# Patient Record
Sex: Male | Born: 1940
Health system: Southern US, Community
[De-identification: ages and names within clinical notes are randomized; demographics above are authoritative.]

## PROBLEM LIST (undated history)

## (undated) DIAGNOSIS — H268 Other specified cataract: Secondary | ICD-10-CM

## (undated) DIAGNOSIS — M1990 Unspecified osteoarthritis, unspecified site: Secondary | ICD-10-CM

## (undated) DIAGNOSIS — Z860100 Personal history of colon polyps, unspecified: Secondary | ICD-10-CM

## (undated) DIAGNOSIS — E785 Hyperlipidemia, unspecified: Secondary | ICD-10-CM

## (undated) DIAGNOSIS — R972 Elevated prostate specific antigen [PSA]: Secondary | ICD-10-CM

## (undated) DIAGNOSIS — K219 Gastro-esophageal reflux disease without esophagitis: Secondary | ICD-10-CM

## (undated) DIAGNOSIS — I10 Essential (primary) hypertension: Secondary | ICD-10-CM

## (undated) DIAGNOSIS — I2510 Atherosclerotic heart disease of native coronary artery without angina pectoris: Secondary | ICD-10-CM

## (undated) DIAGNOSIS — R0602 Shortness of breath: Secondary | ICD-10-CM

## (undated) DIAGNOSIS — I219 Acute myocardial infarction, unspecified: Secondary | ICD-10-CM

## (undated) DIAGNOSIS — G4730 Sleep apnea, unspecified: Secondary | ICD-10-CM

## (undated) HISTORY — DX: Personal history of colonic polyps: Z86.010

## (undated) HISTORY — PX: JOINT REPLACEMENT: SHX530

## (undated) HISTORY — DX: Elevated prostate specific antigen (PSA): R97.20

## (undated) HISTORY — DX: Atherosclerotic heart disease of native coronary artery without angina pectoris: I25.10

## (undated) HISTORY — PX: INGUINAL HERNIA REPAIR: SHX194

## (undated) HISTORY — DX: Essential (primary) hypertension: I10

## (undated) HISTORY — DX: Unspecified osteoarthritis, unspecified site: M19.90

## (undated) HISTORY — DX: Acute myocardial infarction, unspecified: I21.9

## (undated) HISTORY — PX: COLONOSCOPY W/ POLYPECTOMY: SHX1380

## (undated) HISTORY — PX: EYE SURGERY: SHX253

## (undated) HISTORY — PX: HERNIA REPAIR: SHX51

## (undated) HISTORY — DX: Sleep apnea, unspecified: G47.30

## (undated) HISTORY — DX: Gastro-esophageal reflux disease without esophagitis: K21.9

## (undated) HISTORY — PX: CARDIAC CATHETERIZATION: SHX172

## (undated) HISTORY — DX: Personal history of colon polyps, unspecified: Z86.0100

## (undated) HISTORY — DX: Hyperlipidemia, unspecified: E78.5

## (undated) HISTORY — PX: CATARACT EXTRACTION: SUR2

## (undated) HISTORY — DX: Other specified cataract: H26.8

---

## 2000-07-20 ENCOUNTER — Other Ambulatory Visit: Admission: RE | Admit: 2000-07-20 | Discharge: 2000-07-20 | Payer: Self-pay | Admitting: Internal Medicine

## 2000-07-20 ENCOUNTER — Encounter (INDEPENDENT_AMBULATORY_CARE_PROVIDER_SITE_OTHER): Payer: Self-pay | Admitting: Specialist

## 2000-09-30 ENCOUNTER — Encounter: Admission: RE | Admit: 2000-09-30 | Discharge: 2000-09-30 | Payer: Self-pay | Admitting: General Surgery

## 2000-09-30 ENCOUNTER — Encounter: Payer: Self-pay | Admitting: General Surgery

## 2000-10-01 ENCOUNTER — Ambulatory Visit (HOSPITAL_BASED_OUTPATIENT_CLINIC_OR_DEPARTMENT_OTHER): Admission: RE | Admit: 2000-10-01 | Discharge: 2000-10-02 | Payer: Self-pay | Admitting: General Surgery

## 2003-06-04 ENCOUNTER — Emergency Department (HOSPITAL_COMMUNITY): Admission: EM | Admit: 2003-06-04 | Discharge: 2003-06-04 | Payer: Self-pay | Admitting: Emergency Medicine

## 2003-07-08 ENCOUNTER — Encounter: Payer: Self-pay | Admitting: Internal Medicine

## 2003-07-22 HISTORY — PX: TOTAL KNEE ARTHROPLASTY: SHX125

## 2004-05-01 ENCOUNTER — Encounter: Admission: RE | Admit: 2004-05-01 | Discharge: 2004-05-01 | Payer: Self-pay | Admitting: Internal Medicine

## 2004-05-29 ENCOUNTER — Ambulatory Visit: Payer: Self-pay | Admitting: Internal Medicine

## 2004-07-04 ENCOUNTER — Ambulatory Visit: Payer: Self-pay | Admitting: Internal Medicine

## 2004-07-08 ENCOUNTER — Ambulatory Visit (HOSPITAL_COMMUNITY): Admission: RE | Admit: 2004-07-08 | Discharge: 2004-07-08 | Payer: Self-pay | Admitting: Physician Assistant

## 2004-07-08 ENCOUNTER — Encounter: Payer: Self-pay | Admitting: Internal Medicine

## 2004-07-17 ENCOUNTER — Inpatient Hospital Stay (HOSPITAL_COMMUNITY): Admission: RE | Admit: 2004-07-17 | Discharge: 2004-07-25 | Payer: Self-pay | Admitting: Orthopedic Surgery

## 2004-07-17 ENCOUNTER — Ambulatory Visit: Payer: Self-pay | Admitting: Cardiovascular Disease

## 2004-07-17 ENCOUNTER — Ambulatory Visit: Payer: Self-pay | Admitting: Pulmonary Disease

## 2004-07-21 HISTORY — PX: CORONARY ARTERY BYPASS GRAFT: SHX141

## 2004-07-21 HISTORY — PX: TOTAL KNEE ARTHROPLASTY: SHX125

## 2004-08-04 ENCOUNTER — Ambulatory Visit (HOSPITAL_BASED_OUTPATIENT_CLINIC_OR_DEPARTMENT_OTHER): Admission: RE | Admit: 2004-08-04 | Discharge: 2004-08-04 | Payer: Self-pay | Admitting: Orthopedic Surgery

## 2004-08-05 ENCOUNTER — Ambulatory Visit: Payer: Self-pay | Admitting: Cardiovascular Disease

## 2004-08-06 ENCOUNTER — Ambulatory Visit: Payer: Self-pay | Admitting: Internal Medicine

## 2004-08-12 ENCOUNTER — Ambulatory Visit: Payer: Self-pay | Admitting: Internal Medicine

## 2004-08-16 ENCOUNTER — Ambulatory Visit: Payer: Self-pay | Admitting: Internal Medicine

## 2004-08-19 ENCOUNTER — Ambulatory Visit: Payer: Self-pay | Admitting: Internal Medicine

## 2004-08-22 ENCOUNTER — Ambulatory Visit: Payer: Self-pay | Admitting: Critical Care Medicine

## 2004-08-23 ENCOUNTER — Ambulatory Visit (HOSPITAL_BASED_OUTPATIENT_CLINIC_OR_DEPARTMENT_OTHER): Admission: RE | Admit: 2004-08-23 | Discharge: 2004-08-23 | Payer: Self-pay | Admitting: Critical Care Medicine

## 2004-09-04 ENCOUNTER — Ambulatory Visit: Payer: Self-pay | Admitting: Surgery

## 2004-09-04 ENCOUNTER — Inpatient Hospital Stay (HOSPITAL_COMMUNITY): Admission: RE | Admit: 2004-09-04 | Discharge: 2004-09-09 | Payer: Self-pay | Admitting: Surgery

## 2004-09-24 ENCOUNTER — Ambulatory Visit: Payer: Self-pay | Admitting: Cardiovascular Disease

## 2004-09-30 ENCOUNTER — Encounter (HOSPITAL_COMMUNITY): Admission: RE | Admit: 2004-09-30 | Discharge: 2004-11-18 | Payer: Self-pay | Admitting: Cardiovascular Disease

## 2004-10-09 ENCOUNTER — Ambulatory Visit: Payer: Self-pay | Admitting: Cardiovascular Disease

## 2004-11-05 ENCOUNTER — Ambulatory Visit: Payer: Self-pay | Admitting: Pulmonary Disease

## 2005-03-04 ENCOUNTER — Ambulatory Visit: Payer: Self-pay | Admitting: Internal Medicine

## 2005-03-10 ENCOUNTER — Ambulatory Visit: Payer: Self-pay | Admitting: Internal Medicine

## 2005-04-14 ENCOUNTER — Ambulatory Visit: Payer: Self-pay | Admitting: Cardiovascular Disease

## 2005-05-22 ENCOUNTER — Ambulatory Visit: Payer: Self-pay | Admitting: Internal Medicine

## 2005-10-06 ENCOUNTER — Ambulatory Visit: Payer: Self-pay | Admitting: Cardiovascular Disease

## 2005-10-06 ENCOUNTER — Encounter: Payer: Self-pay | Admitting: Internal Medicine

## 2005-11-10 ENCOUNTER — Ambulatory Visit: Payer: Self-pay

## 2006-02-25 ENCOUNTER — Ambulatory Visit: Payer: Self-pay | Admitting: Internal Medicine

## 2006-03-03 ENCOUNTER — Ambulatory Visit: Payer: Self-pay | Admitting: Internal Medicine

## 2006-03-11 ENCOUNTER — Ambulatory Visit: Payer: Self-pay | Admitting: Internal Medicine

## 2006-03-11 LAB — HM COLONOSCOPY

## 2006-05-26 ENCOUNTER — Ambulatory Visit: Payer: Self-pay | Admitting: Internal Medicine

## 2006-05-26 LAB — CONVERTED CEMR LAB
ALT: 18 units/L (ref 0–40)
AST: 25 units/L (ref 0–37)
Chol/HDL Ratio, serum: 3.6
Cholesterol: 146 mg/dL (ref 0–200)
Hgb A1c MFr Bld: 5.5 % (ref 4.6–6.0)
LDL Cholesterol: 89 mg/dL (ref 0–99)

## 2006-10-29 ENCOUNTER — Ambulatory Visit: Payer: Self-pay | Admitting: Cardiovascular Disease

## 2007-03-16 ENCOUNTER — Ambulatory Visit: Payer: Self-pay | Admitting: Internal Medicine

## 2007-03-16 DIAGNOSIS — H26499 Other secondary cataract, unspecified eye: Secondary | ICD-10-CM

## 2007-03-16 DIAGNOSIS — I10 Essential (primary) hypertension: Secondary | ICD-10-CM

## 2007-03-16 DIAGNOSIS — H268 Other specified cataract: Secondary | ICD-10-CM

## 2007-03-17 ENCOUNTER — Telehealth (INDEPENDENT_AMBULATORY_CARE_PROVIDER_SITE_OTHER): Payer: Self-pay | Admitting: *Deleted

## 2007-03-17 ENCOUNTER — Encounter: Payer: Self-pay | Admitting: Internal Medicine

## 2007-03-19 ENCOUNTER — Encounter: Payer: Self-pay | Admitting: Internal Medicine

## 2007-05-04 ENCOUNTER — Ambulatory Visit: Payer: Self-pay | Admitting: Internal Medicine

## 2007-06-25 ENCOUNTER — Ambulatory Visit: Payer: Self-pay | Admitting: Internal Medicine

## 2007-06-25 DIAGNOSIS — E785 Hyperlipidemia, unspecified: Secondary | ICD-10-CM | POA: Insufficient documentation

## 2007-09-03 ENCOUNTER — Encounter: Payer: Self-pay | Admitting: Critical Care Medicine

## 2007-11-26 ENCOUNTER — Ambulatory Visit: Payer: Self-pay | Admitting: Cardiovascular Disease

## 2007-12-27 ENCOUNTER — Encounter: Payer: Self-pay | Admitting: Critical Care Medicine

## 2007-12-30 ENCOUNTER — Emergency Department (HOSPITAL_COMMUNITY): Admission: EM | Admit: 2007-12-30 | Discharge: 2007-12-30 | Payer: Self-pay | Admitting: Emergency Medicine

## 2008-05-18 ENCOUNTER — Ambulatory Visit: Payer: Self-pay | Admitting: Internal Medicine

## 2008-06-14 ENCOUNTER — Ambulatory Visit: Payer: Self-pay | Admitting: Internal Medicine

## 2008-06-14 DIAGNOSIS — Z860100 Personal history of colon polyps, unspecified: Secondary | ICD-10-CM | POA: Insufficient documentation

## 2008-06-14 DIAGNOSIS — R972 Elevated prostate specific antigen [PSA]: Secondary | ICD-10-CM | POA: Insufficient documentation

## 2008-06-14 DIAGNOSIS — R739 Hyperglycemia, unspecified: Secondary | ICD-10-CM

## 2008-06-14 DIAGNOSIS — M1990 Unspecified osteoarthritis, unspecified site: Secondary | ICD-10-CM | POA: Insufficient documentation

## 2008-06-14 DIAGNOSIS — I252 Old myocardial infarction: Secondary | ICD-10-CM | POA: Insufficient documentation

## 2008-06-20 ENCOUNTER — Encounter (INDEPENDENT_AMBULATORY_CARE_PROVIDER_SITE_OTHER): Payer: Self-pay | Admitting: *Deleted

## 2008-06-29 ENCOUNTER — Telehealth: Payer: Self-pay | Admitting: Internal Medicine

## 2008-07-04 ENCOUNTER — Telehealth: Payer: Self-pay | Admitting: Internal Medicine

## 2008-10-31 ENCOUNTER — Telehealth (INDEPENDENT_AMBULATORY_CARE_PROVIDER_SITE_OTHER): Payer: Self-pay | Admitting: *Deleted

## 2008-12-19 DIAGNOSIS — I2510 Atherosclerotic heart disease of native coronary artery without angina pectoris: Secondary | ICD-10-CM

## 2008-12-19 DIAGNOSIS — M1990 Unspecified osteoarthritis, unspecified site: Secondary | ICD-10-CM | POA: Insufficient documentation

## 2008-12-19 DIAGNOSIS — M129 Arthropathy, unspecified: Secondary | ICD-10-CM

## 2008-12-19 DIAGNOSIS — K219 Gastro-esophageal reflux disease without esophagitis: Secondary | ICD-10-CM

## 2008-12-20 ENCOUNTER — Ambulatory Visit: Payer: Self-pay | Admitting: Cardiovascular Disease

## 2009-05-22 ENCOUNTER — Ambulatory Visit: Payer: Self-pay | Admitting: Internal Medicine

## 2009-07-09 ENCOUNTER — Ambulatory Visit: Payer: Self-pay | Admitting: Internal Medicine

## 2009-07-09 DIAGNOSIS — G4733 Obstructive sleep apnea (adult) (pediatric): Secondary | ICD-10-CM | POA: Insufficient documentation

## 2009-07-09 DIAGNOSIS — R9431 Abnormal electrocardiogram [ECG] [EKG]: Secondary | ICD-10-CM

## 2009-07-10 ENCOUNTER — Telehealth (INDEPENDENT_AMBULATORY_CARE_PROVIDER_SITE_OTHER): Payer: Self-pay | Admitting: *Deleted

## 2009-07-16 ENCOUNTER — Encounter (INDEPENDENT_AMBULATORY_CARE_PROVIDER_SITE_OTHER): Payer: Self-pay | Admitting: *Deleted

## 2009-07-26 ENCOUNTER — Telehealth: Payer: Self-pay | Admitting: Cardiovascular Disease

## 2009-07-26 ENCOUNTER — Telehealth (INDEPENDENT_AMBULATORY_CARE_PROVIDER_SITE_OTHER): Payer: Self-pay | Admitting: *Deleted

## 2009-08-03 ENCOUNTER — Ambulatory Visit: Payer: Self-pay | Admitting: Internal Medicine

## 2009-08-03 ENCOUNTER — Encounter (INDEPENDENT_AMBULATORY_CARE_PROVIDER_SITE_OTHER): Payer: Self-pay | Admitting: *Deleted

## 2009-08-03 LAB — CONVERTED CEMR LAB: OCCULT 2: NEGATIVE

## 2009-10-30 ENCOUNTER — Telehealth (INDEPENDENT_AMBULATORY_CARE_PROVIDER_SITE_OTHER): Payer: Self-pay | Admitting: *Deleted

## 2009-10-30 ENCOUNTER — Encounter: Payer: Self-pay | Admitting: Internal Medicine

## 2009-10-31 ENCOUNTER — Encounter (INDEPENDENT_AMBULATORY_CARE_PROVIDER_SITE_OTHER): Payer: Self-pay | Admitting: *Deleted

## 2009-12-20 ENCOUNTER — Ambulatory Visit: Payer: Self-pay | Admitting: Cardiovascular Disease

## 2009-12-20 DIAGNOSIS — I4510 Unspecified right bundle-branch block: Secondary | ICD-10-CM

## 2010-03-28 ENCOUNTER — Telehealth (INDEPENDENT_AMBULATORY_CARE_PROVIDER_SITE_OTHER): Payer: Self-pay | Admitting: *Deleted

## 2010-05-16 ENCOUNTER — Ambulatory Visit: Payer: Self-pay | Admitting: Internal Medicine

## 2010-08-12 ENCOUNTER — Telehealth (INDEPENDENT_AMBULATORY_CARE_PROVIDER_SITE_OTHER): Payer: Self-pay | Admitting: *Deleted

## 2010-08-13 ENCOUNTER — Telehealth: Payer: Self-pay | Admitting: Cardiovascular Disease

## 2010-08-14 ENCOUNTER — Ambulatory Visit
Admission: RE | Admit: 2010-08-14 | Discharge: 2010-08-14 | Payer: Self-pay | Source: Home / Self Care | Attending: Internal Medicine | Admitting: Internal Medicine

## 2010-08-14 ENCOUNTER — Other Ambulatory Visit: Payer: Self-pay | Admitting: Internal Medicine

## 2010-08-14 LAB — HEPATIC FUNCTION PANEL
ALT: 21 U/L (ref 0–53)
AST: 24 U/L (ref 0–37)
Albumin: 4.2 g/dL (ref 3.5–5.2)
Alkaline Phosphatase: 60 U/L (ref 39–117)
Total Bilirubin: 0.8 mg/dL (ref 0.3–1.2)

## 2010-08-14 LAB — LIPID PANEL
HDL: 47.4 mg/dL (ref >39.00)
Total CHOL/HDL Ratio: 4
Triglycerides: 130 mg/dL (ref 0.0–149.0)

## 2010-08-18 LAB — CONVERTED CEMR LAB
AST: 25 units/L (ref 0–37)
Albumin: 4.2 g/dL (ref 3.5–5.2)
BUN: 17 mg/dL (ref 6–23)
BUN: 18 mg/dL (ref 6–23)
Basophils Absolute: 0.1 10*3/uL (ref 0.0–0.1)
Bilirubin, Direct: 0.1 mg/dL (ref 0.0–0.3)
CO2: 30 meq/L (ref 19–32)
Chloride: 102 meq/L (ref 96–112)
Cholesterol: 150 mg/dL (ref 0–200)
Creatinine, Ser: 1 mg/dL (ref 0.4–1.5)
GFR calc non Af Amer: 70.71 mL/min (ref >60)
Glucose, Bld: 95 mg/dL (ref 70–99)
HCT: 45.9 % (ref 39.0–52.0)
HDL goal, serum: 40 mg/dL
HDL: 44.1 mg/dL (ref >39.0)
Hemoglobin: 15 g/dL (ref 13.0–17.0)
Hgb A1c MFr Bld: 5.7 % (ref 4.6–6.0)
Lymphs Abs: 1.6 10*3/uL (ref 0.7–4.0)
MCHC: 32.8 g/dL (ref 30.0–36.0)
MCV: 98.8 fL (ref 78.0–100.0)
Monocytes Absolute: 0.6 10*3/uL (ref 0.1–1.0)
Monocytes Relative: 20 % — ABNORMAL HIGH (ref 3.0–12.0)
Neutro Abs: 0.8 10*3/uL — ABNORMAL LOW (ref 1.4–7.7)
PSA: 3.29 ng/mL (ref 0.10–4.00)
PSA: 3.52 ng/mL (ref 0.10–4.00)
Potassium: 4.3 meq/L (ref 3.5–5.1)
Potassium: 4.7 meq/L (ref 3.5–5.1)
RDW: 12.7 % (ref 11.5–14.6)
Sodium: 141 meq/L (ref 135–145)
TSH: 1.67 microintl units/mL (ref 0.35–5.50)
Total Bilirubin: 0.9 mg/dL (ref 0.3–1.2)
Total CHOL/HDL Ratio: 3.4
VLDL: 17 mg/dL (ref 0–40)

## 2010-08-20 NOTE — Progress Notes (Signed)
Summary: Prior Auth APPROVED for singulair  Phone Note Refill Request Message from:  [facility] Fax #: [phone]  Refills Requested: Medication #1:  PRILOSEC 20 MG CPDR 1 by mouth once daily   Dosage confirmed as above?Dosage Confirmed   Brand Name Necessary? No   Supply Requested: 3 months Prior Auth [id number]  Next Appointment Scheduled: none Initial call taken by: Harold Barban,  October 30, 2009 8:23 AM  Follow-up for Phone Call        prior auth in process Case [id number] .Kandice Hams  October 30, 2009 9:57 AM Prior Auth APPROVED  for Omeprazole from 10/09/09 to 10/30/2010.  [facility] .Kandice Hams  October 31, 2009 8:11 AM   Follow-up by: Kandice Hams,  October 31, 2009 8:11 AM

## 2010-08-20 NOTE — Progress Notes (Signed)
Summary: NEEDS CELEBREX CALLED IN AGAIN  Phone Note Call from Patient Call back at Home Phone [phone]   Caller: Patient Summary of Call: NEEDS REFILL FOR CELEBREX---[facility] [address] [facility] RECORD OF [facility] PRESCRIPTION--PATIENT ASKED THAT WE CALL HIM AT [phone] WHEN PRESCRIPTION HAS BEEN CALLED IN Initial call taken by: Jerolyn Shin,  March 28, 2010 3:22 PM  Follow-up for Phone Call        I called in rx to the pharmacy, left message on voice mail informing patient Follow-up by: Shonna Chock CMA,  March 28, 2010 3:38 PM

## 2010-08-20 NOTE — Medication Information (Signed)
Summary: Prior Authorization & Approval for Additional Quantity Omeprazol  Prior Authorization & Approval for Additional Quantity Omeprazole/[employer]   Imported By: Lanelle Bal 11/05/2009 11:33:46  _____________________________________________________________________  External Attachment:    Type:   Image     Comment:   External Document

## 2010-08-20 NOTE — Assessment & Plan Note (Signed)
Summary: f1y/dm  Medications Added MULTIVITAMINS   TABS (MULTIPLE VITAMIN) 1 tab by mouth once daily NITROSTAT 0.4 MG SUBL (NITROGLYCERIN) 1 tablet under tongue at onset of chest pain; you may repeat every 5 minutes for up to 3 doses.      Allergies Added: NKDA  Primary Provider:  Dr. Alwyn Ren  CC:  check up.  History of Present Illness: Benjamin Adkins is seen today in followup for his coronary disease.  2005 he had a perioperative MI after left knee replacement.  He required an urgent stent to the right coronary artery and subsequent bypass surgery.  He's done well since that time.  His right knee has significant arthritis.  He takes Celebrex for this.  He is a little hesitant to have repeat surgery given his perioperative MI previously.  From our perspective he is doing well.  His risk factors are well controlled.  He is on a statin drug and aspirin.  His LDL is under 80.  He diming sticking chest pain PND or orthopnea. His daughter is doing well in [place].   He is fully retired.  He enjoys reading books by Forde Radon.  And has actually finished writing two books.  One involved the spirituality of the [place].  His last ECG in 12/10 showed a new RBBB compared to 2009.  It is the same today.  I explained the conductin system to him and don't think a RBBB needs further work up at this time since he is asymptomatic.  [facility]  Current Problems (verified): 1)  Abnormal Electrocardiogram  (ICD-794.31) 2)  Sleep Apnea, Chronic  (ICD-780.57) 3)  Hyperlipidemia  (ICD-272.4) 4)  Hypertension, Essential Nos  (ICD-401.9) 5)  Cad  (ICD-414.00) 6)  Myocardial Infarction, Hx of  (ICD-412) 7)  Colonic Polyps, Hx of  (ICD-V12.72) 8)  Osteoarthritis  (ICD-715.90) 9)  Other Abnormal Glucose  (ICD-790.29) 10)  Elevated Prostate Specific Antigen  (ICD-790.93) 11)  Cataract Nec  (ICD-366.8) 12)  After-cataract Nec  (ICD-366.52) 13)  Gerd  (ICD-530.81) 14)  Arthritis   (ICD-716.90)  Current Medications (verified): 1)  Vytorin 10-20 Mg Tabs (Ezetimibe-Simvastatin) .... Take One Tablet At Bedtime 2)  Metoprolol Succinate 25 Mg  Tb24 (Metoprolol Succinate) .Marland Kitchen.. 1 By Mouth Qd 3)  Prilosec 20 Mg Cpdr (Omeprazole) .Marland Kitchen.. 1 By Mouth Once Daily 4)  Multivitamins   Tabs (Multiple Vitamin) .Marland Kitchen.. 1 Tab By Mouth Once Daily 5)  Celebrex 100 Mg Caps (Celecoxib) .... As Needed 6)  Bayer Aspirin 325 Mg Tabs (Aspirin) .... Daily 7)  Glucosamine Chondr 500 Complex  Caps (Glucosamine-Chondroit-Vit C-Mn) .... Daily 8)  Nitrostat 0.4 Mg Subl (Nitroglycerin) .Marland Kitchen.. 1 Tablet Under Tongue At Onset of Chest Pain; You May Repeat Every 5 Minutes For Up To 3 Doses.  Allergies (verified): No Known Drug Allergies  Past History:  Past Medical History: Last updated: 07/09/2009 HYPERLIPIDEMIA (ICD-272.4) HYPERTENSION, ESSENTIAL NOS (ICD-401.9) CAD (ICD-414.00) MYOCARDIAL INFARCTION, HX OF (ICD-412) hx of 2005 in PT post TKR COLONIC POLYPS, HX OF (ICD-V12.72) OSTEOARTHRITIS (ICD-715.90) fasting hyperglycemia (ICD-790.29) ELEVATED PROSTATE SPECIFIC ANTIGEN (ICD-790.93) CATARACT NEC (ICD-366.8) ATHEROSCLEROSIS, CORONARY, ARTERY BYPASS GRFT (ICD-414.04) GERD (ICD-530.81) Sleep Apnea on CPAP  Past Surgical History: Last updated: 07/09/2009 Coronary artery bypass graft,4 vessel 2006 Left heart catheterization, left ventriculography, coronary  angiography, Export thrombectomy of the proximal circumflex and first obtuse  marginal, bare metal stenting of the first obtuse marginal. Successful bare metal stenting of the obtuse  marginal.  Bare metal was used because  the left anterior descending is not  favorable for percutaneous intervention and should be treated with  subsequent coronary artery bypass grafting.  Cataract extraction bilat Colon polypectomy 2002,neg 2008 Inguinal herniorrhaphy bilat,umbilical hernia repair Total knee replacement 2005  Family History: Last updated:  07/09/2009 Father:  Cancer of stomach Mother:  arrhythmia,HTN,breast cancer,CAD Siblings: sister breast cancer ;PGF MI @ 68  Social History: Last updated: 07/09/2009 no diet Widower Alcohol use-yes: socially Former Smoker: quit in college Regular exercise-yes: stationary bike 1X/week & yardwork  Review of Systems       Denies fever, malais, weight loss, blurry vision, decreased visual acuity, cough, sputum, SOB, hemoptysis, pleuritic pain, palpitaitons, heartburn, abdominal pain, melena, lower extremity edema, claudication, or rash.   Vital Signs:  Patient profile:   70 year old male Height:      68 inches Weight:      199 pounds BMI:     30.37 Pulse rate:   69 / minute Resp:     14 per minute BP sitting:   110 / 70  (left arm)  Vitals Entered By: Kem Parkinson (December 20, 2009 2:01 PM)  Physical Exam  General:  Affect appropriate Healthy:  appears stated age HEENT: normal Neck supple with no adenopathy JVP normal no bruits no thyromegaly Lungs clear with no wheezing and good diaphragmatic motion Heart:  S1/S2 no murmur,rub, gallop or click PMI normal Abdomen: benighn, BS positve, no tenderness, no AAA no bruit.  No HSM or HJR Distal pulses intact with no bruits No edema Neuro non-focal Skin warm and dry S/P Left TKR   Impression & Recommendations:  Problem # 1:  HYPERLIPIDEMIA (ICD-272.4) At goal with no side effects and normal ECG His updated medication list for this problem includes:    Vytorin 10-20 Mg Tabs (Ezetimibe-simvastatin) .Marland Kitchen... Take one tablet at bedtime  CHOL: 150 (07/09/2009)   LDL: 88 (07/09/2009)   HDL: 46.90 (07/09/2009)   TG: 76.0 (07/09/2009) CHOL (goal): 200 (06/14/2008)   LDL (goal): 100 (06/14/2008)   HDL (goal): 40 (06/14/2008)   TG (goal): 150 (06/14/2008)  Problem # 2:  HYPERTENSION, ESSENTIAL NOS (ICD-401.9) Under good control continue low sodium diet His updated medication list for this problem includes:    Metoprolol  Succinate 25 Mg Tb24 (Metoprolol succinate) .Marland Kitchen... 1 by mouth qd    Bayer Aspirin 325 Mg Tabs (Aspirin) .Marland Kitchen... Daily  Problem # 3:  CAD (ICD-414.00) No angina continue ASA and BB His updated medication list for this problem includes:    Metoprolol Succinate 25 Mg Tb24 (Metoprolol succinate) .Marland Kitchen... 1 by mouth qd    Bayer Aspirin 325 Mg Tabs (Aspirin) .Marland Kitchen... Daily    Nitrostat 0.4 Mg Subl (Nitroglycerin) .Marland Kitchen... 1 tablet under tongue at onset of chest pain; you may repeat every 5 minutes for up to 3 doses.  Problem # 4:  RBBB (ICD-426.4) No evidence of high grade heart block.  May be related to old ILMI.  Since he is asymptomatic will follow. His updated medication list for this problem includes:    Metoprolol Succinate 25 Mg Tb24 (Metoprolol succinate) .Marland Kitchen... 1 by mouth qd    Bayer Aspirin 325 Mg Tabs (Aspirin) .Marland Kitchen... Daily    Nitrostat 0.4 Mg Subl (Nitroglycerin) .Marland Kitchen... 1 tablet under tongue at onset of chest pain; you may repeat every 5 minutes for up to 3 doses.  Patient Instructions: 1)  Your physician recommends that you schedule a follow-up appointment in: ONE YEAR Prescriptions: NITROSTAT 0.4 MG SUBL (NITROGLYCERIN) 1 tablet  under tongue at onset of chest pain; you may repeat every 5 minutes for up to 3 doses.  #25 x 12   Entered by:   Deliah Goody, RN   Authorized by:   Colon Branch, MD, [facility]   Signed by:   Deliah Goody, RN on 12/20/2009   Method used:   [facility]ronically to        [facility] [phone]* (retail)       [address]       [place], [place]  [address]       Ph: [phone]       Fax: [phone]   RxID:   [id number]    Echocardiogram Report  Procedure date:  12/20/2009  Findings:      NSR RBBB/LAFB Old inferolateral MI No change from 12/10

## 2010-08-20 NOTE — Progress Notes (Signed)
Summary: refill  Phone Note Refill Request Message from:  Fax from Pharmacy on [address] fax [phone]  Refills Requested: Medication #1:  VYTORIN 10-20 MG TABS take one tablet at bedtime [BMN]   Dosage confirmed as above?Dosage Confirmed Initial call taken by: Barb Merino,  July 26, 2009 9:28 AM    Prescriptions: VYTORIN 10-20 MG TABS (EZETIMIBE-SIMVASTATIN) take one tablet at bedtime Brand medically necessary #30 Tablet x 11   Entered by:   Shonna Chock   Authorized by:   Marga Melnick MD   Signed by:   Shonna Chock on 07/26/2009   Method used:   Electronically to        [facility] [phone]* (retail)       [address]       [place], [place]  [address]       Ph: [phone]       Fax: [phone]   RxID:   [id number]

## 2010-08-20 NOTE — Assessment & Plan Note (Signed)
Summary: FLU SHOT//PH   Nurse Visit   Allergies: No Known Drug Allergies  Orders Added: 1)  Flu Vaccine 18yrs + MEDICARE PATIENTS [Q2039] 2)  Administration Flu vaccine - MCR [G0008] Flu Vaccine Consent Questions     Do you have a history of severe allergic reactions to this vaccine? no    Any prior history of allergic reactions to egg and/or gelatin? no    Do you have a sensitivity to the preservative Thimersol? no    Do you have a past history of Guillan-Barre Syndrome? no    Do you currently have an acute febrile illness? no    Have you ever had a severe reaction to latex? no    Vaccine information given and explained to patient? yes    Are you currently pregnant? no    Lot Number:[id number]   Exp Date:01/18/2011   Manufacturer: [employer]    Site Given  Left Deltoid IM.medflu

## 2010-08-20 NOTE — Progress Notes (Signed)
Summary: ekg from 12/20   Phone Note Call from Patient Call back at Home Phone [phone] Call back at Work Phone [phone]   Caller: Patient Reason for Call: Talk to Nurse, Lab or Test Results Details for Reason: pt had ekg on 12/20 at dr. hopper office . pt wants dr. Eden Emms to look at ekg. pls call . Initial call taken by: Lorne Skeens,  July 26, 2009 11:43 AM  Follow-up for Phone Call        dr Eden Emms reviewed EKG, RBBB is new but not sign. pt aware Deliah Goody, RN  July 26, 2009 1:07 PM

## 2010-08-20 NOTE — Letter (Signed)
Summary: Results Follow up Letter  [facility] [place]  [address] [place], [place] [address]   Phone: [phone]  Fax: [phone]    08/03/2009 MRN: [id number]  [facility] [address] [place], [place]  [address]  Dear Benjamin Adkins,  The following are the results of your recent test(s):  Test         Result    Pap Smear:        Normal _____  Not Normal _____ Comments: ______________________________________________________ Cholesterol: LDL(Bad cholesterol):         Your goal is less than:         HDL (Good cholesterol):       Your goal is more than: Comments:  ______________________________________________________ Mammogram:        Normal _____  Not Normal _____ Comments:  ___________________________________________________________________ Hemoccult:        Normal _X____  Not normal _______ Comments:    _____________________________________________________________________ Other Tests:    We routinely do not discuss normal results over the telephone.  If you desire a copy of the results, or you have any questions about this information we can discuss them at your next office visit.   Sincerely,

## 2010-08-20 NOTE — Letter (Signed)
Summary: Appointment - Reminder 2  [facility], Main Office  [address]   [place], [place] [address]   Phone: [phone]  Fax: [phone]     October 31, 2009 MRN: [id number]   [facility] [address] [place], [place]  [address]   Dear Mr. Charity,  Our records indicate that it is time to schedule a follow-up appointment with Dr. Eden Emms. It is very important that we reach you to schedule this appointment. We look forward to participating in your health care needs. Please contact us at the number listed above at your earliest convenience to schedule your appointment.  If you are unable to make an appointment at this time, give [place] a call so we can update our records.     Sincerely,   [facility]igd[facility] D[facility] Scheduling Team

## 2010-08-22 NOTE — Progress Notes (Signed)
Summary: refill request   Phone Note Refill Request Message from:  Patient on August 13, 2010 11:38 AM  Refills Requested: Medication #1:  METOPROLOL SUCCINATE 25 MG  TB24 1 by mouth qd [facility] [address] 90 day supply   Method Requested: Telephone to Pharmacy Initial call taken by: Glynda Jaeger,  August 13, 2010 11:38 AM    Prescriptions: METOPROLOL SUCCINATE 25 MG  TB24 (METOPROLOL SUCCINATE) 1 by mouth qd  #30 x 12   Entered by:   Kem Parkinson   Authorized by:   Colon Branch, MD, [facility]   Signed by:   Kem Parkinson on 08/13/2010   Method used:   Electronically to        [facility] [phone]* (retail)       [address]       [place], [place]  [address]       Ph: [phone]       Fax: [phone]   RxID:   [id number]

## 2010-08-22 NOTE — Progress Notes (Signed)
Summary: Vytorin refill---wants 90 day instead of 30  Phone Note Refill Request Message from:  Patient on August 12, 2010 5:08 PM  Refills Requested: Medication #1:  VYTORIN 10-20 MG TABS take one tablet at bedtime [BMN] [facility] [address],-----wants to have a 90 day prescriptiion for this medication instead of a 30 day prescription  Next Appointment Scheduled: none Initial call taken by: Jerolyn Shin,  August 12, 2010 5:09 PM  Follow-up for Phone Call        I spoke with patient and informed him no labs since 06/2009. Schedule appointment for tomorrow to have labs checked and placed 2 weeks worth of samples at the front desk   Follow-up by: Shonna Chock CMA,  August 13, 2010 8:17 AM

## 2010-09-04 ENCOUNTER — Encounter: Payer: Self-pay | Admitting: Internal Medicine

## 2010-09-04 ENCOUNTER — Ambulatory Visit (INDEPENDENT_AMBULATORY_CARE_PROVIDER_SITE_OTHER): Payer: Medicare Other | Admitting: Internal Medicine

## 2010-09-04 DIAGNOSIS — E785 Hyperlipidemia, unspecified: Secondary | ICD-10-CM

## 2010-09-04 DIAGNOSIS — I252 Old myocardial infarction: Secondary | ICD-10-CM

## 2010-09-11 NOTE — Assessment & Plan Note (Signed)
Summary: OV PER NOTE/CBS   Vital Signs:  Patient profile:   70 year old male Weight:      199.0 pounds BMI:     30.37 Pulse rate:   72 / minute Resp:     14 per minute BP sitting:   110 / 72  (left arm) Cuff size:   large  Vitals Entered By: Shonna Chock CMA (September 04, 2010 10:07 AM) CC: Follow-up visit: discuss labs (copy given), patient with copy of formulary   Primary Care Provider:  Dr. Alwyn Ren  CC:  Follow-up visit: discuss labs (copy given) and patient with copy of formulary.  History of Present Illness:      This is a 70 year old man who presents for Hyperlipidemia follow-up. Labs reviewed & risks discussed.The patient denies muscle aches, GI upset, abdominal pain, flushing, itching, constipation, diarrhea, and fatigue.  The patient denies the following symptoms: chest pain/pressure, exercise intolerance, dypsnea, palpitations, syncope, and pedal edema.  Compliance with medications (by patient report) has been near 100%.  Dietary compliance has been good.  The patient reports exercising 2-3 X per week.  Adjunctive measures currently used by the patient include fiber and ASA.  Vytorin 10/20 is expensive; Tier 2.  Current Medications (verified): 1)  Vytorin 10-20 Mg Tabs (Ezetimibe-Simvastatin) .... Take One Tablet At Bedtime 2)  Metoprolol Succinate 25 Mg  Tb24 (Metoprolol Succinate) .Marland Kitchen.. 1 By Mouth Qd 3)  Prilosec 20 Mg Cpdr (Omeprazole) .Marland Kitchen.. 1 By Mouth Once Daily 4)  Multivitamins   Tabs (Multiple Vitamin) .Marland Kitchen.. 1 Tab By Mouth Once Daily 5)  Celebrex 100 Mg Caps (Celecoxib) .... As Needed 6)  Bayer Aspirin 325 Mg Tabs (Aspirin) .... Daily 7)  Glucosamine Chondr 500 Complex  Caps (Glucosamine-Chondroit-Vit C-Mn) .... Daily 8)  Nitrostat 0.4 Mg Subl (Nitroglycerin) .Marland Kitchen.. 1 Tablet Under Tongue At Onset of Chest Pain; You May Repeat Every 5 Minutes For Up To 3 Doses.  Allergies (verified): No Known Drug Allergies  Past History:  Past Medical History: HYPERLIPIDEMIA  (ICD-272.4): NMR Lipoprofile 2004: LDL 154(1653/711), HDL 42, TG 80.NMR  LDL goal = < 125, but with PMH of MI LDL goal = <70..  HYPERTENSION, ESSENTIAL NOS (ICD-401.9) CAD (ICD-414.00);MYOCARDIAL INFARCTION, HX OF (ICD-412)PMH  of 2005 in PT post TKR COLONIC POLYPS, HX OF (ICD-V12.72) OSTEOARTHRITIS (ICD-715.90) fasting hyperglycemia (ICD-790.29) ELEVATED PROSTATE SPECIFIC ANTIGEN (ICD-790.93) CATARACT NEC (ICD-366.8) ATHEROSCLEROSIS, CORONARY, ARTERY BYPASS GRFT (ICD-414.04) GERD (ICD-530.81) Sleep Apnea on CPAP  Physical Exam  General:  well-nourished;alert,appropriate and cooperative throughout examination Lungs:  Normal respiratory effort, chest expands symmetrically. Lungs are clear to auscultation, no crackles or wheezes. Heart:  Normal rate and regular rhythm. S1 and S2 normal without gallop, murmur, click, rub .S4 with slight slurring Pulses:  R and L carotid,radial,dorsalis pedis and posterior tibial pulses are full and equal bilaterally Neurologic:  alert & oriented X3.   Psych:  Focused & motivated   Impression & Recommendations:  Problem # 1:  HYPERLIPIDEMIA (ICD-272.4)  The following medications were removed from the medication list:    Vytorin 10-20 Mg Tabs (Ezetimibe-simvastatin) .Marland Kitchen... Take one tablet at bedtime His updated medication list for this problem includes:    Atorvastatin Calcium 40 Mg Tabs (Atorvastatin calcium) .Marland Kitchen... 1 once daily  Orders: Prescription Created Electronically [phone])  Problem # 2:  MYOCARDIAL INFARCTION, HX OF (ICD-412)  His updated medication list for this problem includes:    Metoprolol Succinate 25 Mg Tb24 (Metoprolol succinate) .Marland Kitchen... 1 by mouth qd    Bayer Aspirin  325 Mg Tabs (Aspirin) .Marland Kitchen... Daily    Nitrostat 0.4 Mg Subl (Nitroglycerin) .Marland Kitchen... 1 tablet under tongue at onset of chest pain; you may repeat every 5 minutes for up to 3 doses.  Complete Medication List: 1)  Metoprolol Succinate 25 Mg Tb24 (Metoprolol succinate) .Marland Kitchen.. 1  by mouth qd 2)  Prilosec 20 Mg Cpdr (Omeprazole) .Marland Kitchen.. 1 by mouth once daily 3)  Multivitamins Tabs (Multiple vitamin) .Marland Kitchen.. 1 tab by mouth once daily 4)  Celebrex 100 Mg Caps (Celecoxib) .... As needed 5)  Bayer Aspirin 325 Mg Tabs (Aspirin) .... Daily 6)  Glucosamine Chondr 500 Complex Caps (Glucosamine-chondroit-vit c-mn) .... Daily 7)  Nitrostat 0.4 Mg Subl (Nitroglycerin) .Marland Kitchen.. 1 tablet under tongue at onset of chest pain; you may repeat every 5 minutes for up to 3 doses. 8)  Atorvastatin Calcium 40 Mg Tabs (Atorvastatin calcium) .Marland Kitchen.. 1 once daily  Patient Instructions: 1)  Please schedule a follow-up appointment in 3 months. 2)  Hepatic Panel prior to visit, ICD-9:995.20 3)  Lipid Panel prior to visit, ICD-9:272.4 4)  It is important that you exercise regularly at least 20 minutes 5 times a week. If you develop chest pain, have severe difficulty breathing, or feel very tired , stop exercising immediately and seek medical attention. Prescriptions: ATORVASTATIN CALCIUM 40 MG TABS (ATORVASTATIN CALCIUM) 1 once daily  #90 x 0   Entered and Authorized by:   Marga Melnick MD   Signed by:   Marga Melnick MD on 09/04/2010   Method used:   [facility] to        [facility] [phone]* (retail)       [address]       [place], [place]  [address]       Ph: [phone]       Fax: [phone]   RxID:   [id number]    Orders Added: 1)  Est. Patient Level III [64332] 2)  Prescription Created Electronically [id number]

## 2010-09-12 ENCOUNTER — Telehealth (INDEPENDENT_AMBULATORY_CARE_PROVIDER_SITE_OTHER): Payer: Self-pay | Admitting: *Deleted

## 2010-09-17 NOTE — Progress Notes (Signed)
Summary: Atorvastatin side effect  Phone Note Call from Patient Call back at Work Phone [phone]   Summary of Call: Patient left message on triage that he was given Atorvastatin and the med has been leaving him light headed. The patient would like to know if this is normal or alternate effect. Please advise. Initial call taken by: Lucious Groves CMA,  September 12, 2010 3:55 PM  Follow-up for Phone Call        This should not cause this ; monitor BP, goal = < 135/85  Follow-up by: Marga Melnick MD,  September 12, 2010 4:58 PM  Additional Follow-up for Phone Call Additional follow up Details #1::        I spoke with patient and informed him of Dr.Hopper's response and then Dr.Hopper spoke with patient. Patient stated he had his b/p checked at dentist today and it was fine.   Dr.Hopper had patient 1/2 med and indicated recheck Lipids/Hep 272.4/995.20 in 10 weeks. Patient ok'd Additional Follow-up by: Shonna Chock CMA,  September 12, 2010 5:02 PM    New/Updated Medications: ATORVASTATIN CALCIUM 40 MG TABS (ATORVASTATIN CALCIUM) 1/2 by mouth once daily

## 2010-10-29 ENCOUNTER — Other Ambulatory Visit: Payer: Self-pay | Admitting: *Deleted

## 2010-10-29 MED ORDER — METOPROLOL SUCCINATE ER 25 MG PO TB24: 25.0000 mg | ORAL_TABLET | Freq: Every day | ORAL | Status: DC

## 2010-11-13 ENCOUNTER — Other Ambulatory Visit: Payer: Medicare Other

## 2010-11-13 ENCOUNTER — Other Ambulatory Visit (INDEPENDENT_AMBULATORY_CARE_PROVIDER_SITE_OTHER): Payer: Medicare Other

## 2010-11-13 DIAGNOSIS — E785 Hyperlipidemia, unspecified: Secondary | ICD-10-CM

## 2010-11-13 LAB — HEPATIC FUNCTION PANEL
AST: 31 U/L (ref 0–37)
Albumin: 4 g/dL (ref 3.5–5.2)
Alkaline Phosphatase: 70 U/L (ref 39–117)
Total Protein: 6.6 g/dL (ref 6.0–8.3)

## 2010-11-15 ENCOUNTER — Encounter: Payer: Self-pay | Admitting: Cardiovascular Disease

## 2010-11-19 ENCOUNTER — Encounter: Payer: Self-pay | Admitting: Cardiovascular Disease

## 2010-11-19 ENCOUNTER — Ambulatory Visit (INDEPENDENT_AMBULATORY_CARE_PROVIDER_SITE_OTHER): Payer: Medicare Other | Admitting: Cardiovascular Disease

## 2010-11-19 DIAGNOSIS — I10 Essential (primary) hypertension: Secondary | ICD-10-CM

## 2010-11-19 DIAGNOSIS — E785 Hyperlipidemia, unspecified: Secondary | ICD-10-CM

## 2010-11-19 DIAGNOSIS — I4510 Unspecified right bundle-branch block: Secondary | ICD-10-CM

## 2010-11-19 DIAGNOSIS — I2510 Atherosclerotic heart disease of native coronary artery without angina pectoris: Secondary | ICD-10-CM

## 2010-11-19 NOTE — Progress Notes (Signed)
Benjamin Adkins is seen today in followup for his coronary disease.  2005 he had a perioperative MI after left knee replacement.  He required an urgent stent to the right coronary artery and subsequent bypass surgery.  He's done well since that time.  His right knee has significant arthritis.  He takes Celebrex for this.  He is a little hesitant to have repeat surgery given his perioperative MI previously.  From our perspective he is doing well.  His risk factors are well controlled.  He is on a statin drug and aspirin.  His LDL is under 80.  He  Denies any  chest pain PND or orthopnea. His daughter is doing well in [place].   He is fully retired.  He enjoys reading books by Forde Radon.  And has actually finished writing two books.  One involved the spirituality of the [facility] [employer]. He is golfing a lot   Does have some dependant edema in legs especially the right with varicose veins and previous vein stripping from CABG  His last ECG in 12/10 showed a new RBBB compared to 2009.  It is the same today.  I explained the conductin system to him and don't think a RBBB needs further work up at this time since he is asymptomatic.   ROS: Denies fever, malais, weight loss, blurry vision, decreased visual acuity, cough, sputum, SOB, hemoptysis, pleuritic pain, palpitaitons, heartburn, abdominal pain, melena, , claudication, or rash.   General: Affect appropriate Healthy:  appears stated age HEENT: normal Neck supple with no adenopathy JVP normal no bruits no thyromegaly Lungs clear with no wheezing and good diaphragmatic motion Heart:  S1/S2 no murmur,rub, gallop or click PMI normal Abdomen: benighn, BS positve, no tenderness, no AAA no bruit.  No HSM or HJR Distal pulses intact with no bruits No edema Neuro non-focal Skin warm and dry No muscular weakness   Current Outpatient Prescriptions  Medication Sig Dispense Refill  . aspirin 325 MG tablet Take 325 mg by mouth daily.        Marland Kitchen atorvastatin  (LIPITOR) 40 MG tablet Take 40 mg by mouth daily.        . celecoxib (CELEBREX) 100 MG capsule Take 100 mg by mouth 2 (two) times daily as needed.       Marland Kitchen glucosamine-chondroitin 500-400 MG tablet Take 1 tablet by mouth daily.       . metoprolol succinate (TOPROL XL) 25 MG 24 hr tablet Take 1 tablet (25 mg total) by mouth daily.  90 tablet  1  . Multiple Vitamin (MULTIVITAMIN) capsule Take 1 capsule by mouth daily.        . nitroGLYCERIN (NITROSTAT) 0.4 MG SL tablet Place 0.4 mg under the tongue every 5 (five) minutes as needed.        Marland Kitchen omeprazole (PRILOSEC) 20 MG capsule Take 20 mg by mouth daily.          Allergies  Review of patient's allergies indicates no known allergies.  Electrocardiogram:  NSR 67 RBBB LAD Old IMI  Assessment and Plan

## 2010-11-19 NOTE — Patient Instructions (Signed)
Your physician recommends that you schedule a follow-up appointment in: YEAR WITH DR NISHAN  Your physician recommends that you continue on your current medications as directed. Please refer to the Current Medication list given to you today. 

## 2010-11-19 NOTE — Assessment & Plan Note (Signed)
Stable no evidence of high grade heart block or syncope

## 2010-11-19 NOTE — Assessment & Plan Note (Signed)
Cholesterol is at goal.  Continue current dose of statin and diet Rx.  No myalgias or side effects.  F/U  LFT's in 6 months. Lab Results  Component Value Date   LDLCALC 100* 11/13/2010

## 2010-11-19 NOTE — Assessment & Plan Note (Signed)
Well controlled.  Continue current medications and low sodium Dash type diet.    

## 2010-11-19 NOTE — Assessment & Plan Note (Signed)
Stable with no angina and good activity level.  Continue medical Rx  

## 2010-11-20 ENCOUNTER — Ambulatory Visit: Payer: Medicare Other | Admitting: Internal Medicine

## 2010-11-23 ENCOUNTER — Encounter: Payer: Self-pay | Admitting: Internal Medicine

## 2010-11-25 ENCOUNTER — Ambulatory Visit (INDEPENDENT_AMBULATORY_CARE_PROVIDER_SITE_OTHER): Payer: Medicare Other | Admitting: Internal Medicine

## 2010-11-25 ENCOUNTER — Encounter: Payer: Self-pay | Admitting: Internal Medicine

## 2010-11-25 DIAGNOSIS — E785 Hyperlipidemia, unspecified: Secondary | ICD-10-CM

## 2010-11-25 DIAGNOSIS — I2510 Atherosclerotic heart disease of native coronary artery without angina pectoris: Secondary | ICD-10-CM

## 2010-11-25 NOTE — Assessment & Plan Note (Signed)
NMR Lipoprofile 2004: LDL 154( 1653/711), HDL 42, TG 80. LDL goal pre MI was < 130; post MI LDL goal = < 100, ideally < 70.

## 2010-11-25 NOTE — Progress Notes (Signed)
  Subjective:    Patient ID: Benjamin Adkins, male    DOB: [birth year], 70 y.o.   MRN: [id number]  HPI Dyslipidemia assessment: Lab results  Reviewed : LDL 100 on Atorvastatin 40 mg  .   Prior Advanced Lipid Testing: NMR 2004 .   Family history of premature CAD/ MI: PGF MI @ 56 . Nutrition: decreased portions .  Exercise: seasonal golf , yardwork . Diabetes : no .  Smoking history  : quit in college .  HTN: controlled @ home  .   Weight :  Up 10#. ROS: fatigue: no ; chest pain : no ;claudication: no; palpitations: no; abd pain/bowel changes: no ; myalgias:no;  syncope : no ; memory loss: no;skin changes: no.   The labs performed 4/25 were on atorvastatin 40 mg one half pill for over a week. He had decreased it because the 40 mg made him "spacey".    Review of Systems     Objective:   Physical Exam Heart:  Normal rate and regular rhythm. S1 and S2 normal without gallop, murmur, click, rub or other extra sounds.Lungs:Chest clear to auscultation; no wheezes, rhonchi,rales ,or rubs present.No increased work of breathing.  All pulses intact without  bruits .No ischemic skin changes.          Assessment & Plan:  #1 dyslipidemia; LDL is at minimal goal of at least 100. This is on atorvastatin 40 mg one half daily  #2 coronary disease  Plan: The atorvastatin would not be changed; he will be increasing his activities in the next few weeks. I would recommend Dr. Gildardo Griffes book "Eat, Drink, and Be Healthy". Recommend rechecking fasting lipids in 4 months.

## 2010-11-25 NOTE — Patient Instructions (Signed)
Monitor fasting lipids & glucose in 4 months

## 2010-11-26 ENCOUNTER — Telehealth: Payer: Self-pay | Admitting: Internal Medicine

## 2010-11-26 NOTE — Telephone Encounter (Signed)
Patient saw Dr Alwyn Ren on 5/7--patient instructions state : "Monitor fasting lipids and glucose in 4 months"---may I please have some diag codes for his lab appt on 2011-04-29?    thanks

## 2010-11-26 NOTE — Telephone Encounter (Signed)
Routine fasting lipids (272.4, CAD)

## 2010-11-26 NOTE — Telephone Encounter (Signed)
790.29, 272.4, 995.20, Dr.Hopper please clarify if this is a regular Lipid panel or [facility], if this is a BMP, Glucose or a1c

## 2010-11-27 NOTE — Telephone Encounter (Signed)
I printed off phone note and had Dr.Hopper clarify Glucose, BMP or A1c and he indicated just a Glocose. I placed printed phone note on Benjamin Adkins's desk with lab order: Routine fasting lipid and glucose (272.4, CAD)

## 2010-11-29 ENCOUNTER — Telehealth: Payer: Self-pay | Admitting: *Deleted

## 2010-11-29 MED ORDER — PREDNISONE 20 MG PO TABS: ORAL_TABLET | ORAL | Status: AC

## 2010-11-29 NOTE — Telephone Encounter (Signed)
Per Hop ok to have prednisone 20mg  1/2 tid #9

## 2010-11-29 NOTE — Telephone Encounter (Signed)
Pt was in 5/712 says he has now gotten into some poison oak and would like to have something called into pharmacy.  [facility]

## 2010-12-03 NOTE — Assessment & Plan Note (Signed)
[facility]                            CARDIOLOGY OFFICE NOTE   NAME:Benjamin Adkins, Benjamin Adkins                        MRN:          [id number]  DATE:11/26/2007                            DOB:          [birth year]    Benjamin Adkins returns today for follow-up.  I have seen him since he had a  perioperative MI after left knee surgery.   He subsequently was found to have severe three-vessel disease and  underwent CABG.   He had all this done back, I believe, in 2005.   The patient has been doing well.  He is active.  He is going out to  [place] for his niece's wedding soon.  He has been playing golf.  He  continues to be bothered by arthritis.  The right knee is hanging in  there and does not need to be replaced yet.  He is starting to get some  stiffness in his hands.  He has not had PND, orthopnea, palpitations,  syncope or lower extremity edema.  We talked a little bit about his use  of Celebrex.  It clearly works better for his arthritis than anything  else and he is willing to take a risk in regards to a small perceived  risk of cardiac problems.  He is well revascularized and has normal LV  function.   His Lipitor was been switched to Vytorin.  His last LDL cholesterol was  under 100.   REVIEW OF SYSTEMS:  Otherwise negative.   He is on:  1. Lopressor 25 b.i.d.  2. Prilosec 20 a day.  3. Celebrex as needed, not regularly.  4. Aspirin a day.  5. Glucosamine.  6. Vytorin 10/20.   EXAMINATION:  Remarkable for a healthy-appearing middle-aged white male  in no distress.  Weight is up a bit at 202, blood pressure 134/81, pulse  58 and regular, afebrile.  HEENT:  Unremarkable.  Carotids are normal without bruit.  No  lymphadenopathy, thyromegaly or JVP elevation.  LUNGS:  Clear, good diaphragmatic motion.  No wheezing.  S1, S2 with  normal heart sounds.  PMI normal.  ABDOMEN:  Benign, bowel sounds positive.  No AAA, no tenderness, no  hepatosplenomegaly or  hepatojugular reflux.  DISTAL PULSES:  Intact, no edema.  NEURO:  Nonfocal.  SKIN:  Warm and dry.  No muscular weakness.  He is status post left  total knee replacement.   His EKG today shows normal with small Q waves in the lateral leads.   IMPRESSION:  1. Coronary artery disease, previous coronary artery bypass grafting.      Continue aspirin and beta blocker.  Follow up in a year.  2. Hypercholesterolemia.  Continue Vytorin.  LFTs were normal.  Follow      up with Dr. Jonny Ruiz.  3. Arthritis.  Continue p.r.n. use of Celebrex.  Consider alternative      given history coronary artery disease and CABG.  Follow up      orthopedic doctors.  4. History of reflux.  Continue Prilosec, low-spice diet, avoid late-      night meals.  Overall, I think he is doing well and I will see him      back in a year.     [name]. Eden Emms, MD, [facility]  Electronically Signed    PCN/MedQ  DD: 11/26/2007  DT: 11/26/2007  Job #: [id number]

## 2010-12-06 NOTE — Procedures (Signed)
NAME:  TRAVERS, GOODLEY NO.:  [id number]   MEDICAL RECORD NO.:  [id number]          PATIENT TYPE:  OUT   LOCATION:  SLEEP CENTER                 FACILITY:  [facility]   PHYSICIAN:  Marcelyn Bruins, M.D. [facility] DATE OF BIRTH:  10/11/1940   DATE OF STUDY:  08/23/2004                              NOCTURNAL POLYSOMNOGRAM   DATE OF STUDY:  August 23, 2004   REFERRING PHYSICIAN:  Dr. Shan Levans   INDICATION FOR THE STUDY:  Hypersomnia with sleep apnea. The patient has  severe obstructive sleep apnea documented on prior study and returns for  pressure titration. Epworth is 8.   SLEEP ARCHITECTURE:  The patient had a total sleep time of 290 minutes with  decreased REM and the patient never achieved slow wave sleep. Sleep onset  latency was mildly prolonged and REM latency was fairly rapid.   IMPRESSION:  1.  Continuous positive airway pressure titration study reveals fairly good      control of previously diagnosed severe obstructive sleep apnea with 13      cm of continuous positive airway pressure. The patient did have a few      breakthrough events on this pressure but also had small numbers of      central apneas. I would start with this pressure however, he may      ultimately need 14-15 centimeters if he remains symptomatic. Of note,      the patient brought his continuous positive airway pressure mask from      home and did well with the titration.  2.  No clinically significant cardiac arrhythmias.  3.  Large numbers of leg jerks with mild sleep disruption. Clinical      correlation is suggested after the patient has been treated adequately      for his sleep-disordered breathing.      KC/MEDQ  D:  08/27/2004 19:05:48  T:  08/27/2004 20:15:37  Job:  [id number]

## 2010-12-06 NOTE — Consult Note (Signed)
NAMEARLANDO, LEISINGER NO.:  [id number]   MEDICAL RECORD NO.:  [id number]          PATIENT TYPE:  INP   LOCATION:  5031                         FACILITY:  [facility]   PHYSICIAN:  Charlton Haws, M.D.     DATE OF BIRTH:  05/18/1941   DATE OF CONSULTATION:  07/19/2004  DATE OF DISCHARGE:                                   CONSULTATION   Mr. Penn is a 70 year old patient of Dr. Thurston Hole. He is two days post left  total knee replacement. We were called to see him emergently for an acute  MI.   The patient was not having chest pain. He is not a diabetic. He has a  history of hypertension and a family history of coronary disease.   However, the patient had unexplained tachycardia postoperatively.   His EKG showed a subacute inferior posterior wall MI with 2.5 mm of ST  segment depression in V2 and V3.   The patient was treated at the bedside with IV heparin and IV Lopressor.   The risks and benefits of acute catheterization were discussed. I talked to  Kirstin, Dr. Eliott Nine. He is two days postoperative, and she said that  blood thinners were probably reasonable. Dr. Thurston Hole would rather deal with  a knee hematoma than a heart attack.   The patient's preoperative medications included:  1.  Prilosec 20 mg a day.  2.  Allegra 180 mg a day.  3.  Lipitor 10 a day.  4.  Toprol 50 a day.   The patient denies a history of heart disease. He denies having a recent  stress test.   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   His activities were somewhat limited recently due to his left knee pain.   Past medical history is otherwise noncontributory.   PHYSICAL EXAMINATION:  GENERAL:  He is a little tachypneic. He is  tachycardic at a rate of 110.  LUNGS:  Clear.  ___________ S1 and S2 with normal heart sounds.  ABDOMEN:  Benign.  EXTREMITIES:  Lower extremities with intact pulses, no edema. He has a  bandage and  recent left total knee replacement.   IMPRESSION:  Postoperative acute inferior posterior wall myocardial  infarction. Need for urgent catheterization. Further recommendations to be  based on the results of this. Hopefully, it will be single-vessel disease.  Again, discussing anticoagulation with Dr. Eliott Nine., I think it is safe  to at least start heparin and I suspect Integrilin will be okay. The patient  has received some dosages of Coumadin, but his INR was only 1.5, and we will  have to hold this for the time being.   We will continue to treat him aggressively with beta blockers. His  preoperative EKG showed sinus bradycardia, rate of 57, and was otherwise  normal.       PN/MEDQ  D:  07/19/2004  T:  07/19/2004  Job:  [id number]

## 2010-12-06 NOTE — Cardiovascular Report (Signed)
NAMEZEKE, AKER NO.:  [id number]   MEDICAL RECORD NO.:  [id number]          PATIENT TYPE:  INP   LOCATION:  2108                         FACILITY:  [facility]   PHYSICIAN:  Salvadore Farber, M.D. LHCDATE OF BIRTH:  February 22, 1941   DATE OF PROCEDURE:  07/19/2004  DATE OF DISCHARGE:                              CARDIAC CATHETERIZATION   PROCEDURE:  Left heart catheterization, left ventriculography, coronary  angiography, Export thrombectomy of the proximal circumflex and first obtuse  marginal, bare metal stenting of the first obtuse marginal.   INDICATION:  Mr. Girardin is a 70 year old gentleman without prior history of  cardiovascular disease.  On December 28 he underwent left total knee  replacement by Dr. Thurston Hole.  This morning he was noted to be tachycardic,  though had no chest discomfort or shortness of breath.  Electrocardiogram  was obtained demonstrating deep ST depressions anterolaterally.  Concern was  raised for pulmonary embolus versus posterior infarct.  He was brought  urgently to the cardiac catheterization laboratory for diagnostic  angiography with possible percutaneous coronary revascularization.   PROCEDURURAL TECHNIQUE:  Informed consent was obtained.  Under 1% lidocaine  local anesthesia, a 6-French sheath was placed in right common femoral  artery using the modified Seldinger technique.  Diagnostic angiography and  ventriculography were performed using JL-4, JR-4, and pigtail catheters.  These demonstrated occlusion of the first obtuse marginal with modest  collateralization from the RCA.  There was also substantial disease of the  proximal LAD.  Clearly, the culprit was the obtuse marginal.  We therefore  decided to proceed to percutaneous revascularization of this lesion.   Anticoagulation was initiated with bivalirudin.  A 6-French CLS 2.5 guide  was advanced over a wire and engaged in the ostium of the left main.  A  Prowater wire was  advanced beyond the occlusion into the distal marginal  without difficulty.  I began by predilating with a 3.0 x 15 mm Maverick at 6  atmospheres for two inflations.  This initially restored TIMI III flow to  the distal vasculature.  However, it resulted in a shifting of plaque or  thrombus and occlusion of the AV groove circumflex.  Subsequently, I  therefore placed a second Prowater wire into the AV groove circumflex.  With  this TIMI III flow was reestablished.  During this time, however, the obtuse  marginal reoccluded.  I further dilated using a 3.0 x 30 mm Maverick for one  minute in the obtuse marginal.  This restored flow.  However, again the AV  groove circumflex occluded.  I therefore treated the AV groove circumflex  ostium using a 2.25 x 20 mm Maverick at 6 atmospheres.  This restored normal  flow to this branch.  I then stented extending from the circumflex into the  obtuse marginal using a 3.5 x 30 mm Driver stent deployed at 12 atmospheres.  After stenting there was only TIMI II flow to the obtuse marginal, but TIMI  III flow into the jailed AV groove circumflex.  Intracoronary verapamil was  administered without benefit.  It appeared that there  was distal embolus.  A  balloon catheter was then advanced to the distal portion of the vessel.  With this enough flow was established to clarify that there was in fact a  large embolus in the distal portion of the obtuse marginal.  I treated this  with an inflation of a 2.25 x 20 mm Maverick at 6 atmospheres.  This  resulted in no significant change. Given his obvious propensity to  thrombosis despite the Angiomas therapy, double bolus eptifibatide was  administered.  I used an Export catheter in the vicinity of the thrombus.  With this, the embolus was completely resolved.  Final angiography  demonstrated no residual stenosis at either the original site of the lesion,  in the AV groove circumflex, or in the site of the embolus. There  was TIMI  III flow to all distal vasculature.  The patient tolerated the procedure  well and was transferred to the holding room in stable condition.   COMPLICATIONS:  None.   FINDINGS:  1.  LV:  123/84/103.  EF approximately 45% with anterolateral hypokinesis      and apical akinesis.  2.  No aortic stenosis or mitral regurgitation.  3.  Left main:  Angiographically normal.  4.  LAD:  The proximal and mid vessel is diffusely diseased up to 80%      stenosis proximally and 70% stenosis to the mid vessel.  There is then a      focal 50% stenosis of the distal portion in the mid vessel. The LAD      gives rise to a single large diagonal branch.  This diagonal has an      ostial 80% stenosis and a mid 90% stenosis.  5.  Circumflex:  The circumflex is large vessel giving rise to a single      large obtuse marginal.  The marginal was occluded and was the culprit      lesion for his acute coronary syndrome.  As described above, this was      treated with bare metal stenting with excellent result.  6.  RCA:  Large, dominant vessel.  There is a 30% stenosis of the mid      vessel.   IMPRESSION/RECOMMENDATIONS:  Successful bare metal stenting of the obtuse  marginal.  Bare metal was used because the left anterior descending is not  favorable for percutaneous intervention and should be treated with  subsequent coronary artery bypass grafting.  I recommend that should he  remain free of substantial angina, he be treated medically for a period of  four to eight weeks while he recovers from his total knee replacement.  CABG  should then be performed.  Plavix will be continued until the time of the  CABG and resumed in the postoperative period.  Aspirin should be continued  indefinitely.  Coumadin will be reinitiated with bridging low-molecular-  weight heparin for DVT prophylaxis as he is status post knee replacement.      Will  WED/MEDQ  D:  07/19/2004  T:  07/19/2004  Job:  [id number]   cc:    Titus Dubin. Alwyn Ren, M.D. [facility]   Charlton Haws, M.D.

## 2010-12-06 NOTE — Op Note (Signed)
NAMEMEADE, HOGELAND                 ACCOUNT NO.:  [id number]   MEDICAL RECORD NO.:  [id number]          PATIENT TYPE:  INP   LOCATION:  2301                         FACILITY:  [facility]   PHYSICIAN:  Evelene Croon, M.D.     DATE OF BIRTH:  09/12/1940   DATE OF PROCEDURE:  09/04/2004  DATE OF DISCHARGE:                                 OPERATIVE REPORT   PREOPERATIVE DIAGNOSIS:  Three-vessel coronary artery disease, status post  acute myocardial infarction after a total knee replacement.   POSTOPERATIVE DIAGNOSIS:  Three-vessel coronary artery disease, status post  acute myocardial infarction after a total knee replacement.   OPERATIVE PROCEDURES:  1.  Median sternotomy.  2.  Extracorporeal circulation.  3.  Coronary artery bypass graft surgery x 4 using a left internal mammary      artery graft to the left anterior descending coronary artery with a      saphenous vein graft to the diagonal branch of the left anterior      descending, a saphenous vein graft to the obtuse marginal of the left      circumflex coronary artery and a saphenous vein graft to the distal      right coronary artery.  4.  Endoscopically vein harvesting from the right leg.   ATTENDING SURGEON:  Evelene Croon, M.D.   ASSISTANT:  Toribio Harbour, N.P.   ANESTHESIA:  General endotracheal.   CLINICAL HISTORY:  This patient is a 70 year old gentleman with a history of  hypertension and hypercholesterolemia, who underwent left total knee  replacement by Elana Alm. Thurston Hole, M.D., on July 17, 2004.  Postoperatively he had unexplained tachycardia and some nausea.  An  electrocardiogram showed an inferoposterior MI with ST segment depression in  lead V2 and V3.  He ruled in for inferior MI with a peak CPK of 134 and a  troponin of 27.8.  Cardiac catheterization on July 19, 2004, showed  three-vessel coronary artery disease.  The culprit appeared to be an  occluded large obtuse marginal that was successfully  opened with angioplasty  and stenting.  The LAD had diffuse 80% proximal stenosis.  There was about  70% stenosis in the mid portion beyond the diagonal branch.  The diagonal  itself had about 90% stenosis.  The right coronary artery was diffusely  irregular with up to 30% mid vessel stenosis.  The left ventricular ejection  fraction was about 45% with anterolateral hypokinesis and apical akinesis.  There was no gradient across the aortic valve and no mitral regurgitation.  The patient had an uneventful recovery after discharge from the hospital and  was treated with Plavix and Coumadin until recently when his Coumadin was  discontinued.  It was felt that coronary artery bypass graft surgery was the  best treatment to prevent further ischemia and infarction.  I discussed the  operative procedure with the patient and his wife, including alternatives,  benefits and risks, including bleeding, blood transfusion, infection,  stroke, myocardial infarction, graft failure and death.  They understood and  agreed to proceed.   DESCRIPTION OF PROCEDURE:  The  patient was taken to the operating room and  placed on the table in the supine position.  After induction of general  endotracheal anesthesia, a Foley catheter was placed in the bladder using  sterile technique.  Then the chest, abdomen and both lower extremities were  prepped and draped in usual sterile manner.  The chest was entered through a  median sternotomy incision and the pericardium opened in the midline.  Examination of the heart showed good ventricular contractility.  The  ascending aorta had no palpable plaques in it.   Then the left internal mammary artery was harvested from the chest wall with  a pedicle graft.  This was a medium caliber vessel with excellent blood flow  through it.  At the same time, a segment of greater saphenous vein was  harvested from the right leg using endoscopic vein harvest technique.  This  vein was of  medium size and good quality.   Then the patient was heparinized.  When an adequate activated clotting time  was achieved, the descending aorta was cannulated using a 20 French aortic  cannula for arterial inflow.  Venous outflow was achieved using a two-stage  venous cannula for the right atrial appendage.  Antegrade cardioplegia and  bent cannula were inserted in the aortic root.   The patient was placed on cardiopulmonary bypass and the distal coronary was  identified.  The LAD was a large graftable vessel.  The diagonal branch was  a medium size vessel that was heavily diseased proximally.  The obtuse  marginal was a large branching vessel.  The stent was palpable in the  proximal portion.  The area beyond the stent was soft and suitable for  grafting.  The distal sub-branches had diffuse disease in them and were  smaller.  The right coronary artery was diffusely diseased.  The distal  right coronary artery just before the takeoff of the posterior descending  branch was soft enough to graft.  The posterior descending artery and  posterolateral branches themselves were small vessels.   Then the aorta was cross clamped and 500 mL of cold blood antegrade  cardioplegia was administered in the aortic root with quick arrest of the  heart.  Systemic hypothermia to 20 degrees Centigrade and topical  hypothermia with iced saline was used.  A temperature probe was placed in  the septum and insulating pad in the pericardium.   The first distal anastomosis was performed to the obtuse marginal branch.  The internal diameter was about 2.5 mm.  The conduit used was a segment of  greater saphenous vein and the anastomosis performed in an end-to-side  manner using continuous 7-0 Prolene suture.  Flow was noted through the  graft and was excellent.   The second distal anastomosis was performed to the diagonal branch.  The internal diameter was about 1.6 mm.  The conduit used was a second segment   of greater saphenous vein and the anastomosis was performed in an end-to-  side manner using continuous 7-0 Prolene suture.  Flow was measured through  the graft and was excellent.  Then a dose of cardioplegia was given down the  vein grafts and into the aortic root.   The third distal anastomosis was performed to the distal right coronary  artery.  The internal diameter was about 2 mm.  The conduit used was a  segment of greater saphenous vein and anastomosis performed in an end-to-  side manner using continuous 7-0 Prolene suture.  Flow  was measured through  the graft and was excellent.   The fourth distal anastomosis was performed to the mid portion of the left  anterior descending coronary artery.  The internal diameter of this vessel  was about 2 mm.  The conduit used was a left internal mammary graft and it  was brought through an opening in the left pericardium anterior to the  phrenic nerve.  It was anastomosed to the LAD in an end-to-side manner using  continuous 8-0 Prolene suture.  The pedicle was sutured to the pericardium  with 6-0 Prolene sutures.  The patient was rewarmed to 37 degrees Centigrade  and clamp removed from the mammary pedicle.  There was rapid warming of the  ventricular septum and return of spontaneous ventricular fibrillation.  The  cross clamp was removed with a time of 60 minutes and the patient  spontaneously converted to sinus rhythm.   A partial occlusion clamp was placed in the aortic root and the three  proximal vein graft anastomoses were formed in an end-to-side manner using  continuous 6-0 Prolene suture.  The clamp was removed, the vein grafts  deaired and the clamps removed from them.  The proximal and distal  anastomoses appeared hemostatic and lie of the grafts satisfactory.  Graft  markers were placed around the proximal anastomoses.  Two temporary right  ventricular and right atrial pacing wires were placed and brought out  through the  skin.   When the patient had rewarmed to 37 degrees Centigrade, he was weaned from  cardiopulmonary bypass.  The total bypass time was 117 minutes.  Cardiac  function appeared excellent with a cardiac output of 5 L/min.  Protamine was  given and the venous and aortic cannulas were removed without difficulty.  Hemostasis was achieved.  Three chest tubes were placed, one in the  posterior pericardium, one in the left pleural space and one in the anterior  mediastinum.  The pericardium was loosely reapproximated over the heart.  The sternum was closed with #6 stainless steel wires.  The fascia was closed  with a continuous 1-0 Vicryl suture.  The subcutaneous tissues were closed  with a continuous 2-0 Vicryl and the skin with 3-0 Vicryl subcuticular  closure. The lower extremity vein harvest site was closed in layers in  similar manner.  The sponge, needle and instrument counts were correct, according to the scrub nurse.  Dry sterile dressings were applied over the  incisions and around the chest tubes, which were hooked to Pleur-Evac  suction.  The patient remained hemodynamically stable and was transported to  the SICU in guarded, but stable condition.      BB/MEDQ  D:  09/04/2004  T:  09/04/2004  Job:  [id number]   cc:   Salvadore Farber, M.D. [facility]  [address]  [address]  [place]  [place] [address]   Cardiac Catheterization Laboratory

## 2010-12-06 NOTE — Discharge Summary (Signed)
NAME:  Benjamin Adkins, Benjamin Adkins                 ACCOUNT NO.:  [id number]   MEDICAL RECORD NO.:  [id number]          PATIENT TYPE:  INP   LOCATION:  5037                         FACILITY:  [facility]   PHYSICIAN:  Robert A. Thurston Hole, M.D. DATE OF BIRTH:  12-Dec-1940   DATE OF ADMISSION:  07/17/2004  DATE OF DISCHARGE:  07/25/2004                                 DISCHARGE SUMMARY   ADMISSION DIAGNOSES:  1.  End-stage degenerative joint disease left knee.  2.  Hypertension.  3.  Reflux.  4.  High cholesterol.   DISCHARGE DIAGNOSES:  1.  End-stage degenerative joint disease left knee.  2.  Hypertension.  3.  Reflux.  4.  High cholesterol.  5.  Acute myocardial infarction.   PROCEDURES IN HOUSE:  1.  Patient underwent a left total knee replacement by Dr. Thurston Hole on      July 17, 2004.  2.  On July 18, 2004, the patient underwent cardiac catheterization with      insertion of cardiac stent.   HOSPITAL COURSE:  On postoperative day zero, the patient did well.   On postoperative day #1, vital signs were stable.  He was afebrile.  His  hemoglobin was 11.5.  His drain was discontinued.  Physical therapy was  ordered.   On postoperative day #2, the patient did well with physical therapy.  He was  slightly agitated.  He had difficulty with sinus issues.  His pulse was 114.  EKG was ordered due to tachycardia.  EKG showed an acute MI.    cardiology was consulted for this.  He was taken down to the cath lab,  underwent cardiac catheterization with stent placement and it was  recommended that due to his multivessel disease, he would eventually need a  coronary artery bypass graft done four to eight weeks postoperatively.  The  patient has minimal pain in his left knee, status post cardiac  catheterization and he was restarted on his CPM.   On postoperative day #3, range of motion was 0 to 90 on CPM.  His hemoglobin  is 9.6.  He has a mild amount of ecchymosis about his knee.  Surgical  wounds  are well approximated.   On postoperative day #4, surgical wounds are well approximated.  His  hemoglobin is 8.6.  He is on Coumadin.   On postoperative day #5, his INR is 9.2.  His surgical wound is well  approximated.  He is progressing with physical therapy.   On postoperative day #6, he has range of motion 0 to 80 degrees.  Surgical  wound is well approximated.  He is having episodes of SVT with 30 to 45  second episodes of apnea.  He was cleared by cardiology despite these EKG  changes and apnea to be transferred to 5000.  Consult was called for  pulmonary to evaluate him for possible sleep apnea.   On postoperative day #7, T-max 100.1.  His hemoglobin was 10.4, INR was 2.5.  His surgical wound was well approximated.  He had difficulty with a heat  rash on his back.  He was  evaluated by pulmonary and felt that he probably  had obstructive sleep apnea and would need a sleep study done on an  outpatient basis and was given CPAP machine.  Patient was also consulted by  Dr. Laneta Simmers for planning for his coronary artery bypass graft that will be  done within four to six weeks.  Dopplers were done of both his legs to  evaluate his veins for this upcoming surgery.   On postoperative day #8, the patient was doing very well with regards to his  knee.  His INR was 2.1.  He had no complaints.  Surgical wound was well  approximated. He was 0 to 90 on his CPM.  He was discharged to home in  stable condition, weightbearing as tolerated on the following medications.   DISCHARGE MEDICATIONS:  1.  Percocet 7.5 one to two q.4-6h. p.r.n. pain.  2.  Robaxin 500 mg one q.4-6h. p.r.n. muscle spasm.  3.  Toprol XL 50 mg one p.o. daily.  4.  Allegra 180 mg one p.o. daily.  5.  Lipitor 10 mg one p.o. daily.  6.  Prilosec 20 mg one p.o. daily.  7.  Coumadin 5 mg one tablet every other day and half a tablet every other      day.  8.  Plavix 75 mg one p.o. daily.  9.  Aspirin 325 mg one p.o.  daily.  10. Flonase two sprays daily.   He is weightbearing as tolerated, on a regular diet.  He has been instructed  to call with temperature greater than 101, increased redness and/or  increased drainage.  He will follow up with Dr. Thurston Hole on July 30, 2004,  for staple removal.  He has a sleep study scheduled August 04, 2004, to  evaluate his sleep apnea and has coronary artery bypass graft scheduled for  September 03, 2004, with Dr. Laneta Simmers.      Kirstin Shepperson, P.A.      Robert A. Thurston Hole, M.D.  Electronically Signed    KS/MEDQ  D:  09/18/2004  T:  09/18/2004  Job:  [id number]

## 2010-12-06 NOTE — Op Note (Signed)
NAME:  Benjamin Adkins, Benjamin Adkins                 ACCOUNT NO.:  [id number]   MEDICAL RECORD NO.:  [id number]          PATIENT TYPE:  INP   LOCATION:  5031                         FACILITY:  [facility]   PHYSICIAN:  Robert A. Thurston Hole, M.D. DATE OF BIRTH:  [birth year]   DATE OF PROCEDURE:  07/17/2004  DATE OF DISCHARGE:                                 OPERATIVE REPORT   PREOPERATIVE DIAGNOSIS:  Left knee degenerative joint disease.   POSTOPERATIVE DIAGNOSIS:  Left knee degenerative joint disease.   OPERATION PERFORMED:  Left total knee replacement using [employer] Scorpio,  total knee system with #7 cemented femur, #9 cemented tibia with a 15 mm  polyethylene flexed tibial spacer and a 26 mm polyethylene cemented patella.   SURGEON:  Elana Alm. Thurston Hole, M.D.   ASSISTANT:  Julien Girt, P.A.   ANESTHESIA:  General.   OPERATIVE TIME:  One hour 30 minutes.   COMPLICATIONS:  None.   DESCRIPTION OF PROCEDURE:  Mr. Dapolito was brought to the operating room on  July 17, 2004 and placed on the operating table in supine position.  He  had received a femoral nerve block preoperatively by anesthesia for  postoperative pain control.  He received Ancef 1 g IV preoperatively for  prophylaxis.  After being placed under general anesthesia, he had a Foley  catheter placed under sterile conditions.  His left knee was then examined.  Range of motion from -5 to 125 degrees and moderate varus deformity.  Knee  stable to ligamentous exam with normal patellar tracking.  The left leg was  then prepped using sterile  DuraPrep and draped using sterile technique.  The leg was exsanguinated and a thigh tourniquet elevated to 350 mm.  Initially through a 15 cm longitudinal incision based over the patella,  initial exposure was made.  The underlying subcutaneous tissues were incised  in line with the skin incision.  A median arthrotomy was performed revealing  an excessive amount of normal-appearing joint fluid.  The  articular surfaces  were inspected.  He had grade 4 changes medially, grade 3 and 4 changes  laterally and grade 3 and 4 changes in the patellofemoral joint.  The medial  and lateral meniscal remnants were removed as well as the anterior cruciate  ligament.  Osteophytes were removed off the femoral condyles and tibial  plateau.  An intramedullary drill was then drilled up the femoral canal for  placement of the distal femoral cutting jig which was placed in the  appropriate amount of rotation and the distal 10 mm cut was made.  The  distal femur was incised.  #7 was found to be the appropriate size.  A #7  cutting jig was placed and then these cuts were made.  The proximal tibia  was then exposed.  The tibial spines were removed with an oscillating saw.  Intramedullary drill was then drilled down the tibial canal for placement of  the proximal tibial cutting jig.  This was placed in the appropriate amount  of rotation and a proximal 4 mm cut was made based off the medial or lower  side.  After this was done, the Scorpio PCL cutter was placed back on the  distal femur and then these cuts were made.  At this point the femoral trial  was placed, then a number 9 tibial base plate trial was placed and with a 15  mm polyethylene flex tibial spacer there was found to be excellent  restoration of normal alignment and excellent stability with a range of  motion from 0 to 130 degrees with no lift off on the tray.  The tibial base  plate was then marked for rotation and the keel cut was made.  After this  was done, the patella was sized.  26 mm was found to be the appropriate size  and recessed 10 mm x 26 mm cut was made and three locking holes were placed.  After this was done, it was felt that all of the trial components were of  excellent size, fit and stability.  They were then removed and the knee was  then jet lavage irrigated with 3 L of saline solution.  The proximal tibia  was exposed and a  #9 tibial baseplate with cement backing was hammered into  position with an excellent fit with excess cement being removed from around  the edges.  The #7 femoral component with cement backing was hammered into  position, also with an excellent fit with excess cement being removed from  around the edges.  The 15 mm polyethylene Flex tibial spacer was then locked  on the tibial baseplate, the knee taken through a full range of motion and  found to be stable with his varus deformity corrected.  The 26 mm  polyethylene cement backed patella was then locked into its recessed hole  and held there with a clamp.  After the cement hardened, patellofemoral  tracking was evaluated and this was found to be normal.  At this point it  was felt that all the components were of excellent size, fit and stability.  The knee was further irrigated with saline and then the arthrotomy was  closed with #1 Ethibond suture over two medium Hemovac drains.  Subcutaneous  tissues were closed with 0 and 2-0 Vicryl, skin closed with skin staples.  Sterile dressings were applied.  The Hemovac injected with 0.25% Marcaine  with epinephrine and 4 mg of morphine and clamped.  Sterile dressings and a  long leg splint applied. Tourniquet was released and the patient then  awakened, extubated and taken to recovery in stable condition.  Sponge and  needle counts were correct times two at the end of this case.       RAW/MEDQ  D:  07/17/2004  T:  07/17/2004  Job:  [id number]

## 2010-12-06 NOTE — Op Note (Signed)
[place]. [facility]  Patient:    Benjamin Adkins, Benjamin Adkins                        MRN: [id number] Proc. Date: 10/01/00 Adm. Date:  [id number] Attending:  Arlis Porta CC:         Corwin Levins, M.D. [facility]   Operative Report  PREOPERATIVE DIAGNOSIS:  Left inguinal hernia.  POSTOPERATIVE DIAGNOSES: 1. Bilateral indirect inguinal hernias. 2. Umbilical hernia.  PROCEDURES: 1. Laparoscopic bilateral inguinal hernia repair with mesh. 2. Umbilical hernia repair with mesh.  SURGEON:  Adolph Pollack, M.D.  ASSISTANT:  Gwenlyn Perking, M.D.  ANESTHESIA:  General.  INDICATIONS:  Mr. Kissoon is a 70 year old male who has noticed a bulge in his groin that has become increasingly painful and large.  I saw him in the office and then scheduled him for urgent left inguinal hernia repair.  At that time in the office, there was a suggestion of potential periumbilical fascial defect.  I discussed the procedure and risks with him.  He asked me to fix an umbilical hernia simultaneously if it was found.  DESCRIPTION OF PROCEDURE:  He was placed supine on the operating table and a general anesthetic administered.  I then examined him and was unable to demonstrate a definite umbilical hernia.  The lower abdomen and groin were shaved, sterilely prepped and draped, and a Foley catheter was placed sterilely.  A subumbilical incision was made, incising the skin sharply. Subcutaneous tissue was dissected sharply and bluntly.  A 1-1.5 cm incision was made in the left anterior rectus sheath.  The underlying rectus muscle was swept laterally and the posterior rectus sheath identified.  The balloon dissection device was then placed in the posterior rectus sheath and under direct vision, balloon dissection of part of the right side and all of the left extraperitoneal area was performed adequately.  The balloon dissector was then removed and the trocar placed, and CO2 gas was pumped  into the extraperitoneal space to create a working space.  Next, two 5 mm trocars were placed just to the right of midline.  I began by identifying the symphysis pubis and Coopers ligament.  I looked in the direct space area, and this was solid.  I subsequently dissected the spermatic cord and isolated it and noticed an indirect sac and reduced it back to the level of the anterior superior iliac spine.  I identified the lateral abdominal wall and dissected filmy adhesions away from it up to the level of the anterior superior iliac spine.  Next, a piece of 4 x 6 inch mesh was brought into the field and a longitudinal slit was cut halfway.  It was then placed in the extraperitoneal space.  There were two tails created by the slit, and these were wrapped around the spermatic cord.  The medial aspect of the mesh was then anchored to Coopers ligament and then to the anterior abdominal wall. The lateral aspect of the mesh was anchored to the anterior abdominal wall. The two tails were crossed and anchored to the anterior abdominal wall.  The anchoring was done by the spiral tacker, and I could palpate all the areas that the tacks were placed.  This allowed for adequate coverage of the direct, indirect, and femoral spaces.  During the procedure, I noticed that he had dilation of the right side of the scrotum as well.  I subsequently approached the right side and dissected out  the right extraperitoneal space.  I exposed the right spermatic cord and noticed an indirect hernia sac.  This was reduced back to the level of the anterior superior iliac spine but in doing so, a tear in the peritoneum was created.  A pneumoperitoneum had been noted earlier and decompressed with a Veress needle.  I repaired the tear in the peritoneum by stapling the peritoneum back together.  I then took another piece of 4 x 6 inch mesh, cut a longitudinal slit in it halfway, and inserted it into the extraperitoneal  space.  I wrapped the two tails of the mesh around the spermatic cord and anchored it medially to Coopers ligament and the anterior abdominal wall.  Laterally, the superior tail was anchored to the anterior abdominal wall, and the inferior one was anchored to the superior one with a single spiral tack.  Both meshes were then anchored anteriorly to the anterior abdominal wall.  On the right side as on the left, this allowed for coverage of the indirect, direct, and femoral spaces.  I then released the extraperitoneal gas and removed the trocars.  I subsequently approached the umbilical hernia.  I amputated the umbilicus and exposed the underlying umbilical hernia, which had some omentum that was easily reducible.  I initially closed this with four interrupted 0 Surgilon sutures.  I then closed the anterior rectus sheath defect with a 0 Vicryl suture.  I then placed a piece of onlay mesh over the umbilical hernia repair and anchored it to the surrounding fascia with the 0 Surgilon sutures.  The umbilicus was reimplanted to the mesh with a 3-0 Vicryl suture.  The area was irrigated.  The subcutaneous tissue was closed over the umbilical mesh with a running 3-0 Vicryl suture.  All skin incisions were then closed with 4-0 Monocryl subcuticular stitches, followed by Steri-Strips and a sterile dressing.  He tolerated the procedure well without any apparent complications and was taken to the recovery room in satisfactory condition. DD:  10/01/00 TD:  10/01/00 Job: [id number]

## 2010-12-06 NOTE — Procedures (Signed)
NAME:  Benjamin Adkins, Benjamin Adkins                 ACCOUNT NO.:  [id number]   MEDICAL RECORD NO.:  [id number]          PATIENT TYPE:  OUT   LOCATION:  SLEEP CENTER                 FACILITY:  [facility]   PHYSICIAN:  Clinton D. Maple Hudson, M.D. DATE OF BIRTH:  1941-06-13   DATE OF STUDY:  08/04/2004                              NOCTURNAL POLYSOMNOGRAM   REFERRING PHYSICIAN:  Dr. Salvatore Marvel   INDICATION FOR STUDY:  Hypersomnia with sleep apnea.  Epworth sleepiness  score 8/24.  BMI 27.3.  Weight 185 pounds.  The patient had had knee  surgery, requested a hospital bed, and took an oxycodone pill for pain at  3:42 a.m.   SLEEP ARCHITECTURE:  Total sleep time 346 minutes with sleep efficiency 80%.  Stage 1 was 14%, stage 2 63%, stages 3 and 4 were absent, REM was 23% of  total sleep time.  Sleep latency 15 minutes, REM latency 18 minutes, awake  after sleep onset 77 minutes, arousal index 52 which is increased.   RESPIRATORY DATA:  NPSG protocol ordered.  RDI 66 obstructive events per  hour, indicating severe obstructive sleep apnea/hypopnea syndrome.  This  included 20 central apneas, 225 obstructive apneas, 131 mixed apneas, and 5  hypopneas.  He slept only supine.  REM RDI 52 per hour.   OXYGEN DATA:  Snoring noted.  Oxygen desaturation to a nadir of 67% with  mean oxygen saturation throughout the study 90-93% on room air.   CARDIAC DATA:  Normal sinus rhythm.  Vagal sinus arrhythmia was noted with  slowing to as low as 34 beats per minute during apneas.   MOVEMENT/PARASOMNIA:  No significant leg jerks.   IMPRESSION/RECOMMENDATION:  Severe obstructive sleep apnea/hypopnea  syndrome, respiratory disturbance index 66 per hour with oxygen desaturation  to 67%.  Suggest early return for CPAP titration or evaluation for  alternative therapies.      CDY/MEDQ  D:  08/11/2004 11:35:37  T:  08/11/2004 15:31:37  Job:  [id number]

## 2010-12-06 NOTE — Assessment & Plan Note (Signed)
Benjamin Adkins                            CARDIOLOGY OFFICE NOTE   NAME:Benjamin Adkins                        MRN:          [id number]  DATE:10/29/2006                            DOB:          Jan 06, 1941    Benjamin Adkins returns today for followup, he is status post CABG.  Unfortunately, he had a perioperative MI after his left knee replacement  requiring fairly urgent stenting and then elective CABG for residual  disease.  He is doing well, he has low normal ejection fraction.  Dr.  Alwyn Ren has been following his cholesterol, he is on Vytorin.  He said  his last report was excellent.  He has not had any significant chest  pain.   Ten point review of systems is negative except for residual arthritis in  the left knee.  We had a bit of a discussion about this and I assured  him that his next knee surgery would be less eventful since his coronary  disease is now identified and treated.  Otherwise he is doing well.   MEDICATIONS:  1. Lopressor 25 a day.  2. Baby aspirin, an aspirin a day.  3. Anti-inflammatories for his knee pain.  4. Vytorin.   We also discussed the [place] Journal [employer] Medicine article regarding  Zetia and I think it is fine for him to be on this.  I think he was on  Lipitor and Zocor before and had not reached target for an LDL under 80.   EXAMINATION:  Blood pressure is 120/70, pulse is 77 and regular.  HEENT:  Normal.  Carotids are normal without bruit.  LUNGS:  Clear.  There is an S1, S2, with normal heart sounds.  ABDOMEN:  Has a palpable aorta.  He had an ultrasound on his last visit  with me because his aorta was palpable and there was no aneurysm.  Distal pulses are intact, no edema.  NEURO:  Nonfocal.  SKIN:  Warm and dry.   EKG showed sinus rhythm with possible __________ MI, with possible old  lateral wall MI.   IMPRESSION:  1. Coronary artery disease status post myocardial infarction with      coronary artery bypass  grafting, low normal ejection fraction,      currently asymptomatic.  Continue aspirin and beta blocker.  2. Cholesterol being followed by Dr. Alwyn Ren.  Continue Vytorin.   I will see the patient back in a year.  Again, he has significant  arthritis in his right knee and may need a replacement in the future.  Currently his cardiac status is extremely stable and he could have this  at any time.     Benjamin Adkins. Benjamin Emms, MD, [facility]  Electronically Signed    PCN/MedQ  DD: 10/29/2006  DT: 10/29/2006  Job #: [id number]

## 2010-12-06 NOTE — Consult Note (Signed)
NAME:  Benjamin Adkins, Benjamin Adkins                 ACCOUNT NO.:  [id number]   MEDICAL RECORD NO.:  [id number]          PATIENT TYPE:  INP   LOCATION:  5037                         FACILITY:  [facility]   PHYSICIAN:  Evelene Croon, M.D.     DATE OF BIRTH:  1941/02/08   DATE OF CONSULTATION:  07/24/2004  DATE OF DISCHARGE:                                   CONSULTATION   REASON FOR CONSULTATION:  Three-vessel coronary artery disease, status post  inferoposterior myocardial infarction and stenting of an occluded obtuse  marginal branch.   HISTORY OF PRESENT ILLNESS:  The patient is a 70 year old gentleman with a  history of hypercholesterolemia and hypertension who underwent a left total  knee replacement on July 17, 2004.  Postoperatively, the patient had  unexplained tachycardia as well as some nausea.  An electrocardiogram showed  an inferoposterior MI with 2.5 mm of ST segment depression in lead V2 and  V3.  He ruled in for myocardial infarction with a peak CK of 1034.  His  troponin increased to 27.84.  He underwent cardiac catheterization on  July 19, 2004 which showed three-vessel coronary artery disease.  The  culprit appeared to be an occluded large obtuse marginal branch.  This was  opened successfully with angioplasty and stenting.  The LAD had diffuse 80%  stenosis.  There was about 70% stenosis in the mid-portion beyond the  diagonal branch.  The diagonal itself had about 90% stenosis.  The right  coronary artery was diffusely irregular with up to 30% mid-vessel stenosis.  Left ventricular ejection fraction was about 45% with anterolateral  hypokinesis and apical akinesis.  There was no aortic stenosis or mitral  regurgitation.   REVIEW OF SYSTEMS:  GENERAL:  He denies any fever of chills.  He has had no  recent weight changes.  He has been fairly sedentary due to his orthopedic  problems.  EYES:  He has had right eye surgery for cataract.  ENT:  Negative.  ENDOCRINE:  He denies  diabetes and hypothyroidism.  CARDIOVASCULAR:  He denies any chest pain or pressure.  He has had some  shortness of breath with exertion like walking up stairs.  He denies PND and  orthopnea.  He has had no palpitations or peripheral edema.  RESPIRATORY:  He denies cough and sputum production.  GASTROINTESTINAL: He has had no  nausea or vomiting.  He denies melena and bright red blood per rectum.  GENITOURINARY:  He denies dysuria and hematuria.  NEUROLOGICAL:  He denies  any focal weakness or numbness.  He denies dizziness and syncope.  MUSCULOSKELETAL:  He has arthritis in both knees, worse on the left than the  right.  PSYCHIATRIC:  Negative.  ALLERGIES:  None.   MEDICATIONS PRIOR TO ADMISSION:  1.  Toprol 50 mg daily.  2.  Lipitor 10 mg daily.  3.  Allegra 180 mg daily.  4.  Prilosec 20 mg daily.   PAST MEDICAL HISTORY:  Significant for hypertension and  hypercholesterolemia, and he has been followed by Titus Dubin. Alwyn Ren, M.D.  for this.  He has  a possible history of obstructive sleep apnea which is  being worked up.  He has a history of recently-diagnosed coronary artery  disease as mentioned above.   PREVIOUS SURGERY:  Left total knee replacement as well as a surgery for  bilateral inguinal hernia and umbilical hernia repair laparoscopically.   SOCIAL HISTORY:  He is married and lives with his wife.  He is semi-retired  from [employer].  He smoked very briefly in college but has not smoked  since.  He denies alcohol abuse.   FAMILY HISTORY:  His grandfather died of heart disease, but he was elderly.   PHYSICAL EXAMINATION:  VITAL SIGNS:  His blood pressure 133/78, and his  pulse 80 and regular.  His respiratory rate is 16 and unlabored.  GENERAL:  He is a well-developed white male in no distress.  HEENT EXAM:  Normocephalic and atraumatic.  His right pupil is irregular due  to cataract extraction.  Extraocular muscles are intact.  His throat is  clear.  NECK EXAM:   Normal carotid pulses bilaterally.  There are no bruits.  There  is no adenopathy or thyromegaly.  CARDIAC EXAM:  Regular rate and rhythm with normal S1 and S2.  There is no  murmur, rub, or gallop.  LUNG EXAM:  Clear.  ABDOMINAL EXAM:  Active bowel sounds.  His abdomen was soft and nontender.  There are no palpable masses or organomegaly.  EXTREMITY EXAM:  No peripheral edema.  Pedal pulses are palpable  bilaterally.  His left leg is in a CPM machine.  NEUROLOGIC EXAM:  He is alert and oriented x3.  Motor and sensory exams are  grossly normal.  SKIN:  Warm and dry.   DATA:  Carotid Doppler examination shows no evidence of internal carotid  artery stenosis.  His palmar arches are normal with radial and ulnar  compression.   LABORATORY EXAMINATION:  Low potassium of 3.2, glucose 124, BUN 9, and  creatinine 0.9.  Hemoglobin is 9.2, white blood cell count 7.5, platelet  count 201,000.  Chest x-ray shows no evidence of active disease.  Electrocardiogram shows normal sinus rhythm with inferoposterolateral  intact.   IMPRESSION:  This patient has three-vessel coronary artery disease status  post acute inferoposterior MI secondary to occlusion of the large obtuse  marginal branch after knee replacement.  This was opened successfully with  stenting.  He has residual high-grade diffuse stenosis of his proximal LAD  and diagonal which is not amenable to percutaneous intervention. I agree  that coronary artery bypass graft surgery is the best treatment to prevent  further ischemia and infarction.  I have discussed the operative procedure  with the patient including alternatives, benefits, and risks including  bleeding, blood transfusion, infection, stroke, myocardial infarction, graft  failure, and death.  He understands and agrees to proceed.  He is scheduled  to go home tomorrow and will undergo a period of rehabilitation before proceeding with coronary bypass surgery.  He will continue on  Plavix and  aspirin up to the time of surgery, and we will plan to resume these  postoperatively.  I will plan to see him back in the office after he has  been seen by Dr. Thurston Hole when he is felt to be adequately recovered from his  knee surgery to undergo coronary artery bypass surgery.  I gave the patient  my card with my office number and asked him to call for an appointment after  he sees Dr. Thurston Hole.  B[place]a   BB/MEDQ  D:  07/24/2004  T:  07/24/2004  Job:  [id number]   cc:   Molly Maduro A. Thurston Hole, M.D.  [address]  [place] [address]  Fax: [phone]   Charlton Haws, M.D.

## 2010-12-06 NOTE — Discharge Summary (Signed)
NAMEJULES, Benjamin Adkins                 ACCOUNT NO.:  [id number]   MEDICAL RECORD NO.:  [id number]          PATIENT TYPE:  INP   LOCATION:  2022                         FACILITY:  [facility]   PHYSICIAN:  Evelene Croon, M.D.     DATE OF BIRTH:  [birth year]   DATE OF ADMISSION:  09/04/2004  DATE OF DISCHARGE:  09/09/2004                                 DISCHARGE SUMMARY   ADDITIONAL DIAGNOSES:  Three vessel coronary artery disease status post  acute myocardial infarction after a left total knee replacement.   DISCHARGE DIAGNOSES:  1.  Three vessel coronary artery disease status post acute myocardial      infarction, status post coronary artery bypass graft.  2.  History of left total knee replacement by Dr. Salvatore Marvel.  3.  Obstructive sleep apnea resolved by Dr. Shan Levans.  4.  Mild postoperative anemia and coagulopathy, improving after transfusion.  5.  Short term ventilator dependence after coronary artery bypass graft.  6.  Postoperative atrial fibrillation, resolved on Amiodarone.  7.  Hypertension.  8.  Hypercholesterolemia.  9.  Status post bilateral inguinal hernia and umbilical hernia repairs done      laparoscopically.  10. Hiatal hernia.   ALLERGIES:  No known drug allergies.   PROCEDURES:  September 04, 2004, median sternotomy for coronary artery bypass  grafting x4 using the left internal mammary artery to the left anterior  descending, saphenous vein graft to the diagonal branch of the left anterior  descending, saphenous vein graft to the obtuse marginal, the left circumflex  coronary artery and a saphenous vein graft to the distal right coronary  artery.  Endoscopic vein harvesting from the right leg.   SURGEON:  Evelene Croon, M.D.   BRIEF HISTORY:  Benjamin Adkins is a 70 year old Caucasian male with history of  hypertension, hypercholesterolemia.  He underwent a left total knee  replacement for degenerative joint disease on July 17, 2004, by Dr.  Salvatore Marvel.   Postoperatively, the patient had unexplained tachycardia as  well as nausea.  Electrocardiogram showed an inferior posterior myocardial  infarction with ST segment depression in lead V2 and V3.  He ruled in for  inferior myocardial infarction with a peak CK of 1034, troponin 27.8.  Cardiac catheterization by Dr. Samule Ohm was done on July 19, 2004, showing  three vessel coronary artery disease.  The culprit appeared to be an  occluded large obtuse marginal branch that was successfully open with  angioplasty and stenting.  The LAD had diffuse 80% proximal stenosis.  There  was about 70% stenosis in the mid portion beyond the diagonal branch.  The  diagonal branch itself had about a 90% stenosis.  The right coronary artery  was diffusely irregular with up to 30% mid vessel stenosis.  Left  ventricular ejection fraction was about 45%.  Mr. Arnaud had an uneventful  recovery since discharge from the hospital and has been on Plavix and  Coumadin until recently when his Coumadin was discontinued.  He was  scheduled to see Dr. Evelene Croon at the CVTS office for his coronary artery  disease and consideration for coronary artery bypass grafting surgery.  He  was seen by Dr. Laneta Simmers on August 26, 2004.  At that time, he denied any  chest pain or pressure.  He did report mild shortness of breath with  exertion with some fatigue.  After examining the patient in review of his  prior studies, Dr. Laneta Simmers did agree that coronary artery bypass grafting was  his best treatment option.  After discussing risks, benefits and  alternatives with the patient, he agreed to proceed.  Prior to scheduling  the surgery, Dr. Laneta Simmers did discuss the timing with Dr. Thurston Hole.  He also  touched base with Dr. Shan Levans regarding his obstructive sleep apnea.   HOSPITAL COURSE:  On September 04, 2004, Mr. Worrell was electively admitted to  [facility] and did undergo coronary artery bypass grafting surgery.  There  were no known intraoperative complications, and he was transferred to  the surgical intensive care unit and was stable with hemodynamics.  Immediately, postoperative, he did have moderate chest tube output.  His  hematocrit was also decreased at 27.6%.  He was treated with platelets and  packed red blood cells, and his chest tube output began improving.  By  postoperative day #1, Mr. Ritthaler remained ventilatory attendant but appeared  ready for weaning.  He was seen by Dr. Shelle Iron for pulmonology.  He did agree  that the patient was ready for extubation.  His orders for CPAP at nighttime  were continued.  Overall, Mr. Gill was felt to be doing well.  He still had  a mild anemia, but no further transfusions were required.  He also developed  some postoperative atrial fibrillation that required IV Amiodarone.  His  heart rate was in the 60's, so his beta blocker was placed on hold.  His EKG  showed no acute changes.  He did show fluid volume excess and was started on  diuretic therapy.  By that evening, Mr. Teichert had been extubated and  neurologically intact and as maintained a stable respiratory status with  oxygen saturation 95%.  He was now in normal sinus rhythm off of his  external pacer.   By postoperative day #2, Mr. Jacquin was felt appropriate to transfer out of  the unit.  He was maintained sinus rhythm on Amiodarone and was switched to  an oral regimen.  He was also restarted on a low dose Toprol.  Over the next  several days, he remained on unit 2000.  He maintained sinus rhythm in the  80's.  His systolic blood pressure remained stable running primarily in the  120-130 range.  His diastolic readings were ranging in the 70-80's.  He was  afebrile.  He was weaned from supplemental oxygen and was saturating above  90%.  Follow up labs showed a stable hemoglobin and hematocrit of 99.6 and 27.6, respectively.  His renal function remained appropriate as he was  voiding adequately.  On  postoperative day #3, he did complain of loose  stools which did resolve with once his laxatives and stools softeners were  placed on hold.  By weight and by exam, he showed evidence of good diuresis.  By postoperative day #4, Mr. Lamere was ambulating independently in the  hallways with the use of a walker.  He reports that he does have a walker at  home to use as well.  He was tolerating oral diet.  His diarrhea had  resolved.  He denied any shortness of breath,  dyspnea on exertion or chest  pain.  His heart had irregular rate and rhythm.  Lungs were clear.  Abdominal exam was benign.  Extremities showed no significant edema.  Incisions were healing well without signs of infection.  His pain was also  controlled with oral medications.  Pulmonology had followed Mr. Frisk  throughout the hospitalization and felt he was stable from their standpoint.  Although Mr. Alms required a slightly prolonged course of intubation  postoperatively, he was felt to progress well.  In fact, it was estimated  that he will be ready for discharge home by postoperative day #5, September 09, 2004.  Official orders to be written during morning rounds depending  there are no significant changes in Mr. Corporan' status.  His chest tubes were  discontinued prior to transfer to the floor.  External pacing wires were  discontinued prior to discharge.   LABORATORY DATA:  Labs on February 18, showed a white blood count of 8.7,  hemoglobin 9.6, hematocrit 27.6, platelet count 173.  Sodium 135, potassium  4.0, chloride 102, CO2 26, BUN 12, creatinine 0.9, blood glucose 105.  Chest  x-ray on February 28, showed no evidence of pneumothorax with decreasing  bibasilar atelectasis and persistent small pleural effusions, right side  greater than left.  There is stable cardiomegaly.   DISCHARGE MEDICATIONS:  1.  Aspirin 325 mg one p.o. daily.  2.  Toprol XL 25 mg one p.o. daily.  3.  Lipitor 10 mg one p.o. daily.  4.   __________  one p.o. daily.  5.  Prilosec 20 mg one p.o. daily.  6.  Allegra 180 mg one p.o. daily.  7.  Amiodarone 200 mg two tablets p.o. b.i.d. x1 week then decrease to 200      mg one tablet p.o. b.i.d.  8.  Percocet 5/325 mg 1-2 tablets p.o. q.4h. p.r.n. pain.   DISCHARGE INSTRUCTIONS:  1.  Activity:  He was instructed to avoid driving or heavy lifting greater      than 10 pounds.  He was encouraged to continue daily walking and      breathing exercises.  2.  Diet:  He is to follow a low-salt, low-fat diet.  3.  Wound Care:  He may shower and clean the wound with mild soap and water.      He should contact his physician if he develops fever greater than 101 or      redness or drainage per his incision site.   FOLLOWUP:  1.  He is to follow up with Dr. Laneta Simmers at the CVTS office in approximately      three weeks.  CVTS office will contact him regarding his specific     appointment date and time.  2.  He is to call [phone] to schedule two week follow up with Dr. Samule Ohm      with a chest x-ray.  He was instructed to bring his chest x-ray with him      to the appointment with Dr. Laneta Simmers.  3.  He is to follow up with Dr. Salvatore Marvel as directed.  4.  He is to follow up with Dr. Shan Levans as directed.      AWZ/MEDQ  D:  09/08/2004  T:  09/09/2004  Job:  [id number]   cc:   Molly Maduro A. Thurston Hole, M.D.  [address]  [place] [address]  Fax: [phone]   Shan Levans, M.D. [facility]

## 2010-12-10 ENCOUNTER — Other Ambulatory Visit: Payer: Self-pay

## 2010-12-10 MED ORDER — CELECOXIB 100 MG PO CAPS: 100.0000 mg | ORAL_CAPSULE | Freq: Two times a day (BID) | ORAL | Status: DC | PRN

## 2011-01-01 ENCOUNTER — Telehealth: Payer: Self-pay

## 2011-01-01 NOTE — Telephone Encounter (Signed)
Medication has been approved from 12/11/10-01/01/2012

## 2011-02-24 ENCOUNTER — Other Ambulatory Visit: Payer: Self-pay | Admitting: Internal Medicine

## 2011-03-17 ENCOUNTER — Other Ambulatory Visit: Payer: Self-pay | Admitting: Internal Medicine

## 2011-03-17 ENCOUNTER — Telehealth: Payer: Self-pay | Admitting: Internal Medicine

## 2011-03-17 NOTE — Telephone Encounter (Signed)
Pt called says he would like to have rx for dental surgery says he had knee replacement 7 years ago and stated that dentist wont clean teeth unless he has been medicated. Pls advise.

## 2011-03-17 NOTE — Telephone Encounter (Signed)
Amoxicillin 500 mg 4 pills 30-60 minutes pre-dental surgery.

## 2011-03-18 NOTE — Telephone Encounter (Signed)
Pt aware rx sent to pharmacy.

## 2011-04-01 ENCOUNTER — Other Ambulatory Visit: Payer: Self-pay | Admitting: Internal Medicine

## 2011-04-01 DIAGNOSIS — E785 Hyperlipidemia, unspecified: Secondary | ICD-10-CM

## 2011-04-02 ENCOUNTER — Other Ambulatory Visit: Payer: Medicare Other

## 2011-04-04 NOTE — Progress Notes (Signed)
Labs only

## 2011-04-07 ENCOUNTER — Other Ambulatory Visit: Payer: Self-pay | Admitting: Internal Medicine

## 2011-04-08 ENCOUNTER — Other Ambulatory Visit (INDEPENDENT_AMBULATORY_CARE_PROVIDER_SITE_OTHER): Payer: Medicare Other

## 2011-04-08 DIAGNOSIS — E785 Hyperlipidemia, unspecified: Secondary | ICD-10-CM

## 2011-04-08 LAB — LIPID PANEL
Cholesterol: 153 mg/dL (ref 0–200)
LDL Cholesterol: 76 mg/dL (ref 0–99)
Triglycerides: 138 mg/dL (ref 0.0–149.0)
VLDL: 27.6 mg/dL (ref 0.0–40.0)

## 2011-05-26 ENCOUNTER — Telehealth: Payer: Self-pay | Admitting: Internal Medicine

## 2011-05-26 ENCOUNTER — Other Ambulatory Visit: Payer: Self-pay | Admitting: Internal Medicine

## 2011-05-26 DIAGNOSIS — R7309 Other abnormal glucose: Secondary | ICD-10-CM

## 2011-05-26 NOTE — Telephone Encounter (Signed)
Call placed to patient at [phone], no answer. A detailed voice message was left informing patient per Dr Alwyn Ren instructions. Message was left for patient to check with specialist to see if surgical clearance was needed. If needed, message left advising patient to call back to schedule pre-op appointment.

## 2011-05-26 NOTE — Telephone Encounter (Signed)
FYI patient wasted to make dr hopper aware he is having knee replacement  [phone] - dr Wyline Mood

## 2011-05-26 NOTE — Telephone Encounter (Signed)
Typically the orthopedist would want cardiology clearance preop. He  also may want a general medical clearance. He should verify what the specialist recommends.

## 2011-05-27 ENCOUNTER — Other Ambulatory Visit (INDEPENDENT_AMBULATORY_CARE_PROVIDER_SITE_OTHER): Payer: Medicare Other

## 2011-05-27 ENCOUNTER — Ambulatory Visit: Payer: Medicare Other

## 2011-05-27 VITALS — Temp 98.1°F

## 2011-05-27 DIAGNOSIS — R7309 Other abnormal glucose: Secondary | ICD-10-CM

## 2011-05-27 DIAGNOSIS — Z23 Encounter for immunization: Secondary | ICD-10-CM

## 2011-05-27 NOTE — Progress Notes (Signed)
Labs only

## 2011-05-29 ENCOUNTER — Other Ambulatory Visit: Payer: Self-pay | Admitting: Cardiovascular Disease

## 2011-06-02 ENCOUNTER — Other Ambulatory Visit: Payer: Self-pay | Admitting: Internal Medicine

## 2011-06-02 MED ORDER — CELECOXIB 100 MG PO CAPS: 100.0000 mg | ORAL_CAPSULE | Freq: Two times a day (BID) | ORAL | Status: DC | PRN

## 2011-06-02 NOTE — Telephone Encounter (Signed)
RX sent

## 2011-07-02 ENCOUNTER — Ambulatory Visit (INDEPENDENT_AMBULATORY_CARE_PROVIDER_SITE_OTHER): Payer: Medicare Other

## 2011-07-02 DIAGNOSIS — Z2911 Encounter for prophylactic immunotherapy for respiratory syncytial virus (RSV): Secondary | ICD-10-CM

## 2011-07-02 DIAGNOSIS — Z23 Encounter for immunization: Secondary | ICD-10-CM

## 2011-08-11 ENCOUNTER — Encounter (HOSPITAL_COMMUNITY): Payer: Self-pay | Admitting: Pharmacy Technician

## 2011-08-12 ENCOUNTER — Other Ambulatory Visit: Payer: Self-pay | Admitting: Physician Assistant

## 2011-08-12 ENCOUNTER — Encounter: Payer: Self-pay | Admitting: Physician Assistant

## 2011-08-12 DIAGNOSIS — M1711 Unilateral primary osteoarthritis, right knee: Secondary | ICD-10-CM | POA: Insufficient documentation

## 2011-08-12 NOTE — H&P (Signed)
Benjamin Adkins is an 70 y.o. male.   Chief Complaint: right knee end stage DJD HPI: Benjamin Adkins is a 70 year old seen for follow-up for significant right knee end stage degenerative joint disease. We saw him on 11/25/10 and injected the right knee which helped for a month and pain has persisted. He had a left total knee replacement 6 years ago and this is doing well. His primary care physician is Dr. Hopper and his cardiologist is Dr. Nishan. He had a CABG in 2006 after his left total knee replacement when he had post-op MI and has been stable since. The excruciating pain is getting progressively worse, limiting activities of daily living, difficulty rising from a seated position, poses a significant fall risk, and has failed multiple conservative treatments including intraarticular cortisone injections,, bracing, medication and home physical therapy.  Past Medical History  Diagnosis Date  . Hyperlipidemia     nmr lipoprofile 2004: LDL 154 (1653/711), HDL 42, TG 80.NMR  LDL =,125, but PMH  of MI LDL goal=,70  . Unspecified essential hypertension   . Coronary atherosclerosis of unspecified type of vessel, native or graft   . Old myocardial infarction   . Personal history of colonic polyps   . Osteoarthrosis, unspecified whether generalized or localized, unspecified site   . Elevated prostate specific antigen (PSA)   . Other cataract   . Coronary atherosclerosis of artery bypass graft   . Esophageal reflux   . Sleep apnea     uses CPAP    Past Surgical History  Procedure Date  . Coronary artery bypass graft   . Cataract extraction   . Colonoscopy w/ polypectomy   . Inguinal hernia repair   . Total knee arthroplasty 2005    Family History  Problem Relation Age of Onset  . Cancer Father     stomach  . Stomach cancer Father   . Hypertension Mother   . Heart attack Mother   . Breast cancer Mother   . Coronary artery disease Mother   . Heart disease Mother   . Breast cancer Sister   .  Crohn's disease Sister   . Arthritis Sister   . Heart attack Paternal Grandfather 65  . Arthritis Brother    Social History:  reports that he has quit smoking. He does not have any smokeless tobacco history on file. He reports that he drinks about 2.4 ounces of alcohol per week. He reports that he does not use illicit drugs.  Allergies: No Known Allergies  Medications Prior to Admission  Medication Sig Dispense Refill  . aspirin 325 MG tablet Take 325 mg by mouth daily.        . atorvastatin (LIPITOR) 40 MG tablet       . celecoxib (CELEBREX) 100 MG capsule Take 100 mg by mouth 2 (two) times daily as needed. Arthritis pain      . glucosamine-chondroitin 500-400 MG tablet Take 1 tablet by mouth daily.       . metoprolol succinate (TOPROL-XL) 25 MG 24 hr tablet       . Multiple Vitamin (MULTIVITAMIN) capsule Take 1 capsule by mouth daily.        . nitroGLYCERIN (NITROSTAT) 0.4 MG SL tablet Place 0.4 mg under the tongue every 5 (five) minutes as needed. Chest pain      . omeprazole (PRILOSEC) 20 MG capsule Take 20 mg by mouth daily.        No current facility-administered medications on file as of   08/12/2011.    No results found for this or any previous visit (from the past 48 hour(s)). No results found.  Review of Systems  Constitutional: Negative for fever, chills, weight loss, malaise/fatigue and diaphoresis.  HENT: Negative for hearing loss, ear pain, nosebleeds, congestion, sore throat, neck pain, tinnitus and ear discharge.   Eyes: Negative for blurred vision, double vision, photophobia, pain, discharge and redness.  Respiratory: Positive for shortness of breath. Negative for cough, hemoptysis, sputum production, wheezing and stridor.   Cardiovascular: Positive for leg swelling. Negative for chest pain, palpitations, orthopnea, claudication and PND.       Left leg mild swelling s/p vein harvest for CABG in 2006  Gastrointestinal: Negative for heartburn, nausea, vomiting, abdominal  pain, diarrhea, constipation, blood in stool and melena.  Genitourinary: Negative for dysuria, urgency, frequency, hematuria and flank pain.  Musculoskeletal: Positive for back pain and joint pain. Negative for myalgias and falls.       Right knee pain  Skin: Negative for itching and rash.  Neurological: Negative for dizziness, tingling, tremors, sensory change, speech change, focal weakness, seizures, loss of consciousness, weakness and headaches.  Endo/Heme/Allergies: Negative for environmental allergies and polydipsia. Does not bruise/bleed easily.  Psychiatric/Behavioral: Negative for depression, suicidal ideas, hallucinations, memory loss and substance abuse. The patient is not nervous/anxious and does not have insomnia.     Blood pressure 147/83, pulse 68, temperature 97.8 F (36.6 C), height 5' 8" (1.727 m), weight 92.534 kg (204 lb), SpO2 96.00%. Physical Exam  Constitutional: He is oriented to person, place, and time. He appears well-developed and well-nourished.  HENT:  Head: Normocephalic and atraumatic.  Mouth/Throat: Oropharynx is clear and moist.  Eyes: Conjunctivae and EOM are normal. Pupils are equal, round, and reactive to light.  Neck: Normal range of motion. Neck supple.  Cardiovascular: Normal rate and regular rhythm.   Murmur heard. Respiratory: Effort normal and breath sounds normal.  GI: Soft. Bowel sounds are normal.  Genitourinary:       Not pertinent to current symptomatology therefore not examined.  Musculoskeletal:       Examination of his right knee reveals significant varus deformity diffuse pain 1+ crepitation 1+ synovitis range of motion 0-120 degrees knee is stable with normal patella tracking. Left knee reveals well healed total knee incisions without swelling or pain, range of motion 0-120 degrees knee is stable with normal patella tracking.   Neurological: He is alert and oriented to person, place, and time.  Skin: Skin is warm and dry.  Psychiatric:  He has a normal mood and affect. His behavior is normal. Judgment and thought content normal.   XRAY: AP,LATERAL, FLEXION, and SUNRISE views show significant joint space narrowing, with periarticular osteophytes, and subchondral sclerosis.   Assessment Patient Active Problem List  Diagnoses  . HYPERLIPIDEMIA  . AFTER-CATARACT NEC  . CATARACT NEC  . HYPERTENSION, ESSENTIAL NOS  . MYOCARDIAL INFARCTION, HX OF  . CAD  . RBBB  . GERD  . OSTEOARTHRITIS  . ARTHRITIS  . SLEEP APNEA, CHRONIC  . OTHER ABNORMAL GLUCOSE  . ELEVATED PROSTATE SPECIFIC ANTIGEN  . ABNORMAL ELECTROCARDIOGRAM  . COLONIC POLYPS, HX OF  . Arthritis of right knee       Plan We inject the right knee under sterile conditions with Depo-Medrol/Marcaine and he tolerates this well. He wants to proceed with right total knee replacement in the near future. He needs to be cleared by Dr. Nishan.  He has an appointment tomorrow with [facility]   for surgical clearanceDiscussed risks benefits and possible complications of the surgery in detail and he understands this completely.   Delylah Stanczyk J 08/12/2011, 3:06 PM    

## 2011-08-13 ENCOUNTER — Ambulatory Visit (INDEPENDENT_AMBULATORY_CARE_PROVIDER_SITE_OTHER): Payer: Medicare Other | Admitting: Physician Assistant

## 2011-08-13 ENCOUNTER — Encounter: Payer: Self-pay | Admitting: Physician Assistant

## 2011-08-13 VITALS — BP 107/74 | HR 63 | Ht 68.0 in | Wt 202.0 lb

## 2011-08-13 DIAGNOSIS — I2510 Atherosclerotic heart disease of native coronary artery without angina pectoris: Secondary | ICD-10-CM

## 2011-08-13 DIAGNOSIS — Z01818 Encounter for other preprocedural examination: Secondary | ICD-10-CM | POA: Insufficient documentation

## 2011-08-13 DIAGNOSIS — I10 Essential (primary) hypertension: Secondary | ICD-10-CM

## 2011-08-13 DIAGNOSIS — E785 Hyperlipidemia, unspecified: Secondary | ICD-10-CM

## 2011-08-13 NOTE — Assessment & Plan Note (Signed)
controlled 

## 2011-08-13 NOTE — Progress Notes (Signed)
HPI:  This is a 71 year old white male patient who is here for cardiac clearance before undergoing knee replacement August 25, 2011. In 2005 he had a perioperative MI after left knee replacement. He required urgent stenting to the RCA and subsequent CABG. He did have a negative treadmill prior to his knee surgery in 2005. He has done well since that time. He has not had any stress test or echo since then. He has a history of a right bundle branch block.his lipids are controlled. He has no history of diabetes and does not smoke.  The patient admits to some dyspnea on exertion but thinks it's related to deconditioning secondary to his arthritic knee. He says when he is working in the yard sometimes he has to stop to catch his breath. This usually occurs after 30 minutes of strenuous activity such as mowing his front lawn. He tries to remain active in golfing weekly and biking several days a week. He denies chest pain, palpitations, dyspnea at rest, dizziness, or presyncope.   No Known Allergies  Current Outpatient Prescriptions on File Prior to Visit  Medication Sig Dispense Refill  . aspirin 325 MG tablet Take 325 mg by mouth daily.        Marland Kitchen atorvastatin (LIPITOR) 40 MG tablet       . celecoxib (CELEBREX) 100 MG capsule Take 100 mg by mouth 2 (two) times daily as needed. Arthritis pain      . glucosamine-chondroitin 500-400 MG tablet Take 1 tablet by mouth daily.       . metoprolol succinate (TOPROL-XL) 25 MG 24 hr tablet       . Multiple Vitamin (MULTIVITAMIN) capsule Take 1 capsule by mouth daily.        . nitroGLYCERIN (NITROSTAT) 0.4 MG SL tablet Place 0.4 mg under the tongue every 5 (five) minutes as needed. Chest pain      . omeprazole (PRILOSEC) 20 MG capsule Take 20 mg by mouth daily.         Past Medical History  Diagnosis Date  . Hyperlipidemia     nmr lipoprofile 2004: LDL 154 (1653/711), HDL 42, TG 80.NMR  LDL =,125, but PMH  of MI LDL goal=,70  . Unspecified essential  hypertension   . Coronary atherosclerosis of unspecified type of vessel, native or graft   . Old myocardial infarction   . Personal history of colonic polyps   . Osteoarthrosis, unspecified whether generalized or localized, unspecified site   . Elevated prostate specific antigen (PSA)   . Other cataract   . Coronary atherosclerosis of artery bypass graft   . Esophageal reflux   . Sleep apnea     uses CPAP    Past Surgical History  Procedure Date  . Coronary artery bypass graft   . Cataract extraction   . Colonoscopy w/ polypectomy   . Inguinal hernia repair   . Total knee arthroplasty 2005    Family History  Problem Relation Age of Onset  . Cancer Father     stomach  . Stomach cancer Father   . Hypertension Mother   . Heart attack Mother   . Breast cancer Mother   . Coronary artery disease Mother   . Heart disease Mother   . Breast cancer Sister   . Crohn's disease Sister   . Arthritis Sister   . Heart attack Paternal Grandfather 71  . Arthritis Brother     History   Social History  . Marital Status: Widowed  Spouse Name: N/A    Number of Children: N/A  . Years of Education: N/A   Occupational History  . Not on file.   Social History Main Topics  . Smoking status: Former Smoker    Quit date: 08/12/1961  . Smokeless tobacco: Not on file   Comment: during college  . Alcohol Use: 2.4 oz/week    4 Glasses of wine per week     Social  . Drug Use: No  . Sexually Active: Not on file   Other Topics Concern  . Not on file   Social History Narrative  . No narrative on file    ROS: See HPI Eyes: Negative Ears:Negative for hearing loss, tinnitus Cardiovascular: Negative for chest pain, palpitations,irregular heartbeat, dyspnea,  near-syncope, orthopnea, paroxysmal nocturnal dyspnea and syncope,edema, claudication, cyanosis,.  Respiratory:   Negative for cough, hemoptysis, shortness of breath, sleep disturbances due to breathing, sputum production and  wheezing.   Endocrine: Negative for cold intolerance and heat intolerance.  Hematologic/Lymphatic: Negative for adenopathy and bleeding problem. Does not bruise/bleed easily.  Musculoskeletal:arthritic knee, prior left knee replacement   Gastrointestinal: Negative for nausea, vomiting, reflux, abdominal pain, diarrhea, constipation.   Neurological: Negative.  Allergic/Immunologic: Negative for environmental allergies.    PHYSICAL EXAM: Well-nournished, in no acute distress. Neck: No JVD, HJR, Bruit, or thyroid enlargement Lungs: No tachypnea, clear without wheezing, rales, or rhonchi Cardiovascular: RRR, PMI not displaced, heart sounds normal, no murmurs, gallops, bruit, thrill, or heave. Abdomen: BS normal. Soft without organomegaly, masses, lesions or tenderness. Extremities: without cyanosis, clubbing or edema. Good distal pulses bilateral SKin: Warm, no lesions or rashes  Musculoskeletal: No deformities Neuro: no focal signs  BP 107/74  Pulse 63  Ht 5\' 8"  (1.727 m)  Wt 202 lb (91.627 kg)  BMI 30.71 kg/m2   DEY:CXKGYJ sinus rhythm with right bundle branch block and left anterior fascicular block old lateral infarct. The left anterior fascicular block is new.

## 2011-08-13 NOTE — Assessment & Plan Note (Addendum)
Patient is here for preoperative cardiac clearance before undergoing right total knee replacement February 4. Patient has a history of a perioperative MI 2005 after knee replacement. This was treated with urgent PTCA followed by CABG. Patient does have some dyspnea on exertion which could be due to deconditioning but need to rule out ischemia. He also has a new left anterior fascicular block on EKG. After discussing with Dr. Eden Emms, and the patient undergo stress Myoview prior to surgery. Patient would like to try to walk on the treadmill.  If stress test is normal patient will be cleared for surgery from a cardiac standpoint.

## 2011-08-13 NOTE — Patient Instructions (Signed)
Your physician recommends that you schedule a follow-up appointment in: 1 year with Dr Eden Emms Your physician has requested that you have en exercise stress myoview. For further information please visit [email or web address] Please follow instruction sheet, as given.

## 2011-08-13 NOTE — Assessment & Plan Note (Signed)
Patient had perioperative MI after knee replacement 2005 followed by emergency PTCA RCA and CABG. He has done well since that time but has noticed dyspnea on exertion recently. This occurs while working in the yard after about 30 minutes. We will reevaluate with stress Myoview.

## 2011-08-13 NOTE — Assessment & Plan Note (Signed)
Followed by Dr Hopper 

## 2011-08-14 ENCOUNTER — Ambulatory Visit (HOSPITAL_COMMUNITY): Payer: Medicare Other | Attending: Cardiology | Admitting: Radiology

## 2011-08-14 VITALS — BP 135/71 | Ht 68.0 in | Wt 200.0 lb

## 2011-08-14 DIAGNOSIS — R0609 Other forms of dyspnea: Secondary | ICD-10-CM | POA: Insufficient documentation

## 2011-08-14 DIAGNOSIS — Z951 Presence of aortocoronary bypass graft: Secondary | ICD-10-CM | POA: Insufficient documentation

## 2011-08-14 DIAGNOSIS — E785 Hyperlipidemia, unspecified: Secondary | ICD-10-CM | POA: Insufficient documentation

## 2011-08-14 DIAGNOSIS — I10 Essential (primary) hypertension: Secondary | ICD-10-CM | POA: Insufficient documentation

## 2011-08-14 DIAGNOSIS — Z8249 Family history of ischemic heart disease and other diseases of the circulatory system: Secondary | ICD-10-CM | POA: Insufficient documentation

## 2011-08-14 DIAGNOSIS — I4510 Unspecified right bundle-branch block: Secondary | ICD-10-CM

## 2011-08-14 DIAGNOSIS — R0602 Shortness of breath: Secondary | ICD-10-CM

## 2011-08-14 DIAGNOSIS — Z87891 Personal history of nicotine dependence: Secondary | ICD-10-CM | POA: Insufficient documentation

## 2011-08-14 DIAGNOSIS — I2510 Atherosclerotic heart disease of native coronary artery without angina pectoris: Secondary | ICD-10-CM

## 2011-08-14 DIAGNOSIS — Z01810 Encounter for preprocedural cardiovascular examination: Secondary | ICD-10-CM | POA: Insufficient documentation

## 2011-08-14 DIAGNOSIS — R0989 Other specified symptoms and signs involving the circulatory and respiratory systems: Secondary | ICD-10-CM | POA: Insufficient documentation

## 2011-08-14 MED ORDER — TECHNETIUM TC 99M TETROFOSMIN IV KIT
10.0000 | PACK | Freq: Once | INTRAVENOUS | Status: AC | PRN
  Administered 2011-08-14: 10 via INTRAVENOUS

## 2011-08-14 MED ORDER — TECHNETIUM TC 99M TETROFOSMIN IV KIT
30.0000 | PACK | Freq: Once | INTRAVENOUS | Status: AC | PRN
  Administered 2011-08-14: 30 via INTRAVENOUS

## 2011-08-14 NOTE — Progress Notes (Signed)
[facility] SITE 3 NUCLEAR MED [address] [place] [address] [phone]  Cardiology Nuclear Med Study  Benjamin Adkins is a 71 y.o. male [id number] [birth year]   Nuclear Med Background Indication for Stress Test:  Evaluation for Ischemia, Surgical Clearance and Graft Patency History: 2005 GXT: (-) prior to TKR in DEC, 12/05 MI: inferoposterior- 2 days post (L) TKR, 12/05 Heart Cath: Multivessel DZ EF: 45% immediate stent then sent for CABG after recoup. From knee surgery, 12/05 Stents: OM, 02/06 CABG x4 Cardiac Risk Factors: Family History - CAD, History of Smoking, Hypertension, Lipids and RBBB  Symptoms:  DOE   Nuclear Pre-Procedure Caffeine/Decaff Intake:  None NPO After: 8:00pm   Lungs:  clear IV 0.9% NS with Angio Cath:  20g  IV Site: L Hand  IV Started by:  Bonnita Levan, RN  Chest Size (in):  46 Cup Size: n/a  Height: 5\' 8"  (1.727 m)  Weight:  200 lb (90.719 kg)  BMI:  Body mass index is 30.41 kg/(m^2). Tech Comments:  Patient held Metoprolol x 24 hrs    Nuclear Med Study 1 or 2 day study: 1 day  Stress Test Type:  Stress  Reading MD: Willa Rough, MD  Order Authorizing Provider:  Charlton Haws, MD  Resting Radionuclide: Technetium 36m Tetrofosmin  Resting Radionuclide Dose: 11.0 mCi   Stress Radionuclide:  Technetium 28m Tetrofosmin  Stress Radionuclide Dose: 33.0 mCi           Stress Protocol Rest HR: 131 Stress HR: 131  Rest BP: 135/71 Stress BP: 183/81  Exercise Time (min): 7:05 METS: 8.0   Predicted Max HR: 150 bpm % Max HR: 87.33 bpm Rate Pressure Product: 21308   Dose of Adenosine (mg):  n/a Dose of Lexiscan: n/a mg  Dose of Atropine (mg): n/a Dose of Dobutamine: n/a mcg/kg/min (at max HR)  Stress Test Technologist: Milana Na, EMT-P  Nuclear Technologist:  Domenic Polite, CNMT     Rest Procedure:  Myocardial perfusion imaging was performed at rest 45 minutes following the intravenous administration of Technetium 6m  Tetrofosmin. Rest ECG: Sinus Bradycardia  Stress Procedure:  The patient exercised for 7:05.  The patient stopped due to fatigue and denied any chest pain.  There were non specific ST-T wave changes.  Technetium 33m Tetrofosmin was injected at peak exercise and myocardial perfusion imaging was performed after a brief delay. Stress ECG: with stress there is ST depression in the anterior precordial leads.  QPS Raw Data Images:  Patient motion noted; appropriate software correction applied. Stress Images:  There is severe decrease in activity in the entire inferolateral wall and anterolateral wall. There is mild decreased activity in the apical cap and in the apical inferior segment. Rest Images:  Same as stress Subtraction (SDS):  No evidence of ischemia. Transient Ischemic Dilatation (Normal <1.22):  0.96 Lung/Heart Ratio (Normal <0.45):  0.30  Quantitative Gated Spect Images QGS EDV:  134 ml QGS ESV:  57 ml QGS cine images:  wall motion is significantly better than would be expected based on the tomographic images. There is some hypokinesis of the mid and distal lateral wall. However the overall ejection fraction is maintained at 57%. QGS EF: 57%  Impression Exercise Capacity:  Fair exercise capacity. BP Response:  Normal blood pressure response. Clinical Symptoms:  No chest pain. ECG Impression:  there is ST depression in leads V1 to V3. However there is an underlying right bundle branch block. Comparison with Prior Nuclear Study: No images  to compare  Overall Impression:  there is evidence of an old large scar affecting the entire inferolateral wall and the entire anterolateral wall. The scar also affects the apical cap and the apical inferior segment. There is no significant ischemia. Wall motion is better than would be expected based on the tomographic images.  Willa Rough, MD

## 2011-08-18 ENCOUNTER — Encounter: Payer: Self-pay | Admitting: *Deleted

## 2011-08-19 ENCOUNTER — Encounter (HOSPITAL_COMMUNITY)
Admission: RE | Admit: 2011-08-19 | Discharge: 2011-08-19 | Disposition: A | Discharge status: Home / Self Care | Payer: Medicare Other | Source: Ambulatory Visit | Attending: Orthopedic Surgery | Admitting: Orthopedic Surgery

## 2011-08-19 ENCOUNTER — Encounter (HOSPITAL_COMMUNITY): Payer: Self-pay

## 2011-08-19 HISTORY — DX: Shortness of breath: R06.02

## 2011-08-19 LAB — DIFFERENTIAL
Basophils Absolute: 0 10*3/uL (ref 0.0–0.1)
Lymphocytes Relative: 26 % (ref 12–46)
Lymphs Abs: 2.2 10*3/uL (ref 0.7–4.0)
Monocytes Absolute: 0.8 10*3/uL (ref 0.1–1.0)
Monocytes Relative: 10 % (ref 3–12)
Neutro Abs: 5 10*3/uL (ref 1.7–7.7)

## 2011-08-19 LAB — COMPREHENSIVE METABOLIC PANEL
ALT: 19 U/L (ref 0–53)
AST: 21 U/L (ref 0–37)
Albumin: 4.1 g/dL (ref 3.5–5.2)
Calcium: 9.7 mg/dL (ref 8.4–10.5)
Creatinine, Ser: 0.83 mg/dL (ref 0.50–1.35)
Sodium: 138 mEq/L (ref 135–145)

## 2011-08-19 LAB — CBC
MCH: 32 pg (ref 26.0–34.0)
MCV: 89.5 fL (ref 78.0–100.0)
Platelets: 160 10*3/uL (ref 150–400)
RDW: 12.8 % (ref 11.5–15.5)
WBC: 8.2 10*3/uL (ref 4.0–10.5)

## 2011-08-19 LAB — TYPE AND SCREEN
ABO/RH(D): B POS
Antibody Screen: NEGATIVE

## 2011-08-19 LAB — URINALYSIS, ROUTINE W REFLEX MICROSCOPIC
Hgb urine dipstick: NEGATIVE
Nitrite: NEGATIVE
Protein, ur: NEGATIVE mg/dL
Urobilinogen, UA: 0.2 mg/dL (ref 0.0–1.0)

## 2011-08-19 LAB — SURGICAL PCR SCREEN: Staphylococcus aureus: NEGATIVE

## 2011-08-19 MED ORDER — CHLORHEXIDINE GLUCONATE 4 % EX LIQD: 60.0000 mL | Freq: Once | CUTANEOUS | Status: DC

## 2011-08-19 NOTE — Progress Notes (Signed)
PT  HAD  MYOVIEW STRESS LAST WEEK....Marland KitchenSEES DR. Burna Forts ON YRLY BASIS SINCE HEART ATTACK IN 2005-2006.........Marland Kitchen

## 2011-08-19 NOTE — Pre-Procedure Instructions (Addendum)
20 CLARKE PERETZ  08/19/2011   Your procedure is scheduled on: Monday, Feb. 4 th   Report to [facility] at  5:30 AM.  Call this number if you have problems the morning of surgery: [phone]   Remember:   Do not eat food:After Midnight Sunday.  May have clear liquids: up to 4 Hours before arrival time (1:30AM).  Clear liquids include soda, tea, black coffee, apple or grape juice, broth.   Take these medicines the morning of surgery with A SIP OF WATER: Omeprazole,             Metoprolol**   Do not wear jewelry, make-up or nail polish.  Do not wear lotions, powders, or perfumes. You may wear deodorant.   Do not bring valuables to the hospital.   Contacts, dentures or bridgework may not be worn into surgery.  Leave suitcase in the car. After surgery it may be brought to your room.  For patients admitted to the hospital, checkout time is 11:00 AM the day of discharge.   Patients discharged the day of surgery will not be allowed to drive home.  Name and phone number of your driver: [facility]**  Special Instructions: CHG Shower Use Special Wash: 1/2 bottle night before surgery and 1/2 bottle morning of surgery.   Please read over the following fact sheets that you were given: Coughing and Deep Breathing, Blood Transfusion Information, MRSA Information and Surgical Site Infection Prevention, Pain Management

## 2011-08-20 LAB — URINE CULTURE: Culture: NO GROWTH

## 2011-08-20 NOTE — Consult Note (Signed)
Anesthesia:  Patient is a 71 year old male scheduled for a right TKR on 08/25/11.  History includes HLD, HTN,CAD/MI s/p BMS OM1 stent and CABG, cataracts, reflux, OSA with CPAP use, former smoker, known right BBB.  PCP is Dr. Marga Melnick.  His primary Cardiologist is Dr. Eden Emms.  He was seen at [facility] for clearance.  He had a stress myoview study on 08/14/11 that showed "there is evidence of an old large scar affecting the entire inferolateral wall and the entire anterolateral wall. The scar also affects the apical cap and the apical inferior segment. There is no significant ischemia. Wall motion is better than would be expected based on the tomographic images."  EF was 57%. Dr. Eden Emms ultimately cleared him for surgery.  See Results Review tab.  EKG from 08/13/11 showed NSR, right BBB, LAFB, bifascicular block, possible lateral infarct, and inferior infarct.  His last cath was on 07/19/04 when he underwent emergent stenting and subsequent CABG.   Labs are unremarkable.  In Epic there is no CXR done within the last year, so he will need one on the day of surgery.

## 2011-08-24 MED ORDER — CEFAZOLIN SODIUM-DEXTROSE 2-3 GM-% IV SOLR
2.0000 g | INTRAVENOUS | Status: AC
  Administered 2011-08-25: 2 g via INTRAVENOUS
  Filled 2011-08-24: qty 50

## 2011-08-24 MED ORDER — LACTATED RINGERS IV SOLN: INTRAVENOUS | Status: DC

## 2011-08-25 ENCOUNTER — Encounter (HOSPITAL_COMMUNITY): Payer: Self-pay | Admitting: Vascular Surgery

## 2011-08-25 ENCOUNTER — Ambulatory Visit (HOSPITAL_COMMUNITY): Payer: Medicare Other

## 2011-08-25 ENCOUNTER — Ambulatory Visit (HOSPITAL_COMMUNITY): Payer: Medicare Other | Admitting: Vascular Surgery

## 2011-08-25 ENCOUNTER — Encounter (HOSPITAL_COMMUNITY): Payer: Self-pay | Admitting: *Deleted

## 2011-08-25 ENCOUNTER — Encounter (HOSPITAL_COMMUNITY)
Admission: RE | Disposition: A | Discharge status: Skilled Nursing Facility | Payer: Self-pay | Source: Ambulatory Visit | Attending: Orthopedic Surgery

## 2011-08-25 ENCOUNTER — Inpatient Hospital Stay (HOSPITAL_COMMUNITY)
Admission: RE | Admit: 2011-08-25 | Discharge: 2011-08-28 | DRG: 470 | Disposition: A | Discharge status: Skilled Nursing Facility | Payer: Medicare Other | Source: Ambulatory Visit | Attending: Orthopedic Surgery | Admitting: Orthopedic Surgery

## 2011-08-25 DIAGNOSIS — E785 Hyperlipidemia, unspecified: Secondary | ICD-10-CM | POA: Diagnosis present

## 2011-08-25 DIAGNOSIS — Z951 Presence of aortocoronary bypass graft: Secondary | ICD-10-CM

## 2011-08-25 DIAGNOSIS — Z8249 Family history of ischemic heart disease and other diseases of the circulatory system: Secondary | ICD-10-CM

## 2011-08-25 DIAGNOSIS — Z7982 Long term (current) use of aspirin: Secondary | ICD-10-CM

## 2011-08-25 DIAGNOSIS — D62 Acute posthemorrhagic anemia: Secondary | ICD-10-CM

## 2011-08-25 DIAGNOSIS — I252 Old myocardial infarction: Secondary | ICD-10-CM

## 2011-08-25 DIAGNOSIS — Z96659 Presence of unspecified artificial knee joint: Secondary | ICD-10-CM

## 2011-08-25 DIAGNOSIS — Z803 Family history of malignant neoplasm of breast: Secondary | ICD-10-CM

## 2011-08-25 DIAGNOSIS — H26499 Other secondary cataract, unspecified eye: Secondary | ICD-10-CM | POA: Diagnosis present

## 2011-08-25 DIAGNOSIS — M1710 Unilateral primary osteoarthritis, unspecified knee: Principal | ICD-10-CM | POA: Diagnosis present

## 2011-08-25 DIAGNOSIS — I10 Essential (primary) hypertension: Secondary | ICD-10-CM | POA: Diagnosis present

## 2011-08-25 DIAGNOSIS — I4510 Unspecified right bundle-branch block: Secondary | ICD-10-CM | POA: Diagnosis present

## 2011-08-25 DIAGNOSIS — Z01812 Encounter for preprocedural laboratory examination: Secondary | ICD-10-CM

## 2011-08-25 DIAGNOSIS — I2510 Atherosclerotic heart disease of native coronary artery without angina pectoris: Secondary | ICD-10-CM | POA: Diagnosis present

## 2011-08-25 DIAGNOSIS — Z860100 Personal history of colon polyps, unspecified: Secondary | ICD-10-CM

## 2011-08-25 DIAGNOSIS — K219 Gastro-esophageal reflux disease without esophagitis: Secondary | ICD-10-CM | POA: Diagnosis present

## 2011-08-25 DIAGNOSIS — M1711 Unilateral primary osteoarthritis, right knee: Secondary | ICD-10-CM | POA: Insufficient documentation

## 2011-08-25 DIAGNOSIS — G4730 Sleep apnea, unspecified: Secondary | ICD-10-CM | POA: Diagnosis present

## 2011-08-25 DIAGNOSIS — G4733 Obstructive sleep apnea (adult) (pediatric): Secondary | ICD-10-CM | POA: Insufficient documentation

## 2011-08-25 DIAGNOSIS — Z800 Family history of malignant neoplasm of digestive organs: Secondary | ICD-10-CM

## 2011-08-25 DIAGNOSIS — Z01818 Encounter for other preprocedural examination: Secondary | ICD-10-CM

## 2011-08-25 HISTORY — PX: TOTAL KNEE ARTHROPLASTY: SHX125

## 2011-08-25 SURGERY — ARTHROPLASTY, KNEE, TOTAL
Anesthesia: General | Site: Knee | Laterality: Right | Wound class: Clean

## 2011-08-25 MED ORDER — GLYCOPYRROLATE 0.2 MG/ML IJ SOLN
INTRAMUSCULAR | Status: DC | PRN
  Administered 2011-08-25: .6 mg via INTRAVENOUS

## 2011-08-25 MED ORDER — HYDROMORPHONE HCL PF 1 MG/ML IJ SOLN
INTRAMUSCULAR | Status: AC
  Filled 2011-08-25: qty 1

## 2011-08-25 MED ORDER — NEOSTIGMINE METHYLSULFATE 1 MG/ML IJ SOLN
INTRAMUSCULAR | Status: DC | PRN
  Administered 2011-08-25: 5 mg via INTRAVENOUS

## 2011-08-25 MED ORDER — MIDAZOLAM HCL 5 MG/5ML IJ SOLN
INTRAMUSCULAR | Status: DC | PRN
  Administered 2011-08-25: 2 mg via INTRAVENOUS

## 2011-08-25 MED ORDER — ENOXAPARIN SODIUM 30 MG/0.3ML ~~LOC~~ SOLN
30.0000 mg | Freq: Two times a day (BID) | SUBCUTANEOUS | Status: DC
  Administered 2011-08-25 – 2011-08-28 (×6): 30 mg via SUBCUTANEOUS
  Filled 2011-08-25 (×7): qty 0.3

## 2011-08-25 MED ORDER — EPHEDRINE SULFATE 50 MG/ML IJ SOLN
INTRAMUSCULAR | Status: DC | PRN
  Administered 2011-08-25: 5 mg via INTRAVENOUS

## 2011-08-25 MED ORDER — ROSUVASTATIN CALCIUM 20 MG PO TABS
20.0000 mg | ORAL_TABLET | Freq: Every day | ORAL | Status: DC
  Administered 2011-08-26 (×2): 20 mg via ORAL
  Filled 2011-08-25 (×4): qty 1

## 2011-08-25 MED ORDER — MENTHOL 3 MG MT LOZG: 1.0000 | LOZENGE | OROMUCOSAL | Status: DC | PRN

## 2011-08-25 MED ORDER — CEFAZOLIN SODIUM-DEXTROSE 2-3 GM-% IV SOLR
2.0000 g | Freq: Four times a day (QID) | INTRAVENOUS | Status: AC
  Administered 2011-08-25 – 2011-08-26 (×3): 2 g via INTRAVENOUS
  Filled 2011-08-25 (×3): qty 50

## 2011-08-25 MED ORDER — SODIUM CHLORIDE 0.9 % IR SOLN
Status: DC | PRN
  Administered 2011-08-25: 3000 mL

## 2011-08-25 MED ORDER — HYDROMORPHONE HCL PF 1 MG/ML IJ SOLN
0.5000 mg | INTRAMUSCULAR | Status: DC | PRN
  Administered 2011-08-25: 1 mg via INTRAVENOUS
  Administered 2011-08-25 (×3): 0.5 mg via INTRAVENOUS
  Administered 2011-08-26 (×2): 1 mg via INTRAVENOUS
  Filled 2011-08-25 (×6): qty 1

## 2011-08-25 MED ORDER — METOCLOPRAMIDE HCL 10 MG PO TABS: 5.0000 mg | ORAL_TABLET | Freq: Three times a day (TID) | ORAL | Status: DC | PRN

## 2011-08-25 MED ORDER — METOPROLOL SUCCINATE ER 25 MG PO TB24
25.0000 mg | ORAL_TABLET | Freq: Every day | ORAL | Status: DC
  Administered 2011-08-27 – 2011-08-28 (×2): 25 mg via ORAL
  Filled 2011-08-25 (×4): qty 1

## 2011-08-25 MED ORDER — ROCURONIUM BROMIDE 100 MG/10ML IV SOLN
INTRAVENOUS | Status: DC | PRN
  Administered 2011-08-25: 50 mg via INTRAVENOUS

## 2011-08-25 MED ORDER — MORPHINE SULFATE 2 MG/ML IJ SOLN: 0.0500 mg/kg | INTRAMUSCULAR | Status: DC | PRN

## 2011-08-25 MED ORDER — ADULT MULTIVITAMIN W/MINERALS CH
1.0000 | ORAL_TABLET | Freq: Every day | ORAL | Status: DC
  Administered 2011-08-26 – 2011-08-28 (×3): 1 via ORAL
  Filled 2011-08-25 (×4): qty 1

## 2011-08-25 MED ORDER — PROPOFOL 10 MG/ML IV BOLUS
INTRAVENOUS | Status: DC | PRN
  Administered 2011-08-25: 150 mg via INTRAVENOUS

## 2011-08-25 MED ORDER — NITROGLYCERIN 0.4 MG SL SUBL: 0.4000 mg | SUBLINGUAL_TABLET | SUBLINGUAL | Status: DC | PRN

## 2011-08-25 MED ORDER — PHENOL 1.4 % MT LIQD
1.0000 | OROMUCOSAL | Status: DC | PRN
  Administered 2011-08-26: 1 via OROMUCOSAL
  Filled 2011-08-25 (×2): qty 177

## 2011-08-25 MED ORDER — ONDANSETRON HCL 4 MG PO TABS: 4.0000 mg | ORAL_TABLET | Freq: Four times a day (QID) | ORAL | Status: DC | PRN

## 2011-08-25 MED ORDER — OXYCODONE HCL 5 MG PO TABS
5.0000 mg | ORAL_TABLET | ORAL | Status: DC | PRN
  Administered 2011-08-26 – 2011-08-27 (×4): 10 mg via ORAL
  Filled 2011-08-25 (×4): qty 2

## 2011-08-25 MED ORDER — CELECOXIB 100 MG PO CAPS
100.0000 mg | ORAL_CAPSULE | Freq: Two times a day (BID) | ORAL | Status: DC
  Administered 2011-08-26 – 2011-08-28 (×5): 100 mg via ORAL
  Filled 2011-08-25 (×8): qty 1

## 2011-08-25 MED ORDER — ONDANSETRON HCL 4 MG/2ML IJ SOLN
4.0000 mg | Freq: Four times a day (QID) | INTRAMUSCULAR | Status: DC | PRN
  Administered 2011-08-25 – 2011-08-26 (×3): 4 mg via INTRAVENOUS
  Filled 2011-08-25 (×2): qty 2

## 2011-08-25 MED ORDER — CEFUROXIME SODIUM 1.5 G IJ SOLR
INTRAMUSCULAR | Status: DC | PRN
  Administered 2011-08-25: 1.5 g

## 2011-08-25 MED ORDER — HYDROMORPHONE HCL PF 1 MG/ML IJ SOLN
0.2500 mg | INTRAMUSCULAR | Status: DC | PRN
  Administered 2011-08-25 (×4): 0.25 mg via INTRAVENOUS

## 2011-08-25 MED ORDER — BISACODYL 5 MG PO TBEC
10.0000 mg | DELAYED_RELEASE_TABLET | Freq: Every day | ORAL | Status: DC
  Administered 2011-08-27 (×2): 10 mg via ORAL
  Filled 2011-08-25 (×4): qty 2

## 2011-08-25 MED ORDER — POTASSIUM CHLORIDE IN NACL 20-0.9 MEQ/L-% IV SOLN
INTRAVENOUS | Status: DC
  Filled 2011-08-25 (×6): qty 1000

## 2011-08-25 MED ORDER — MEPERIDINE HCL 25 MG/ML IJ SOLN: 6.2500 mg | INTRAMUSCULAR | Status: DC | PRN

## 2011-08-25 MED ORDER — FENTANYL CITRATE 0.05 MG/ML IJ SOLN
INTRAMUSCULAR | Status: DC | PRN
  Administered 2011-08-25 (×2): 100 ug via INTRAVENOUS
  Administered 2011-08-25 (×2): 50 ug via INTRAVENOUS

## 2011-08-25 MED ORDER — ONDANSETRON HCL 4 MG/2ML IJ SOLN
INTRAMUSCULAR | Status: DC | PRN
  Administered 2011-08-25: 4 mg via INTRAVENOUS

## 2011-08-25 MED ORDER — ACETAMINOPHEN 325 MG PO TABS: 650.0000 mg | ORAL_TABLET | Freq: Four times a day (QID) | ORAL | Status: DC | PRN

## 2011-08-25 MED ORDER — MULTIVITAMINS PO CAPS: 1.0000 | ORAL_CAPSULE | Freq: Every day | ORAL | Status: DC

## 2011-08-25 MED ORDER — ONDANSETRON HCL 4 MG/2ML IJ SOLN
INTRAMUSCULAR | Status: AC
  Filled 2011-08-25: qty 2

## 2011-08-25 MED ORDER — METOCLOPRAMIDE HCL 5 MG/ML IJ SOLN
5.0000 mg | Freq: Three times a day (TID) | INTRAMUSCULAR | Status: DC | PRN
  Administered 2011-08-25: 10 mg via INTRAVENOUS
  Filled 2011-08-25 (×2): qty 2

## 2011-08-25 MED ORDER — ONDANSETRON HCL 4 MG/2ML IJ SOLN: 4.0000 mg | Freq: Once | INTRAMUSCULAR | Status: DC | PRN

## 2011-08-25 MED ORDER — ACETAMINOPHEN 650 MG RE SUPP: 650.0000 mg | Freq: Four times a day (QID) | RECTAL | Status: DC | PRN

## 2011-08-25 MED ORDER — PANTOPRAZOLE SODIUM 40 MG PO TBEC
40.0000 mg | DELAYED_RELEASE_TABLET | Freq: Every day | ORAL | Status: DC
  Administered 2011-08-26 – 2011-08-28 (×3): 40 mg via ORAL
  Filled 2011-08-25 (×2): qty 1

## 2011-08-25 MED ORDER — BUPIVACAINE-EPINEPHRINE PF 0.5-1:200000 % IJ SOLN
INTRAMUSCULAR | Status: DC | PRN
  Administered 2011-08-25: 30 mL

## 2011-08-25 MED ORDER — MORPHINE SULFATE 4 MG/ML IJ SOLN: 0.0500 mg/kg | INTRAMUSCULAR | Status: DC | PRN

## 2011-08-25 MED ORDER — OXYCODONE-ACETAMINOPHEN 5-325 MG PO TABS
1.0000 | ORAL_TABLET | ORAL | Status: DC | PRN
  Administered 2011-08-26 – 2011-08-27 (×3): 2 via ORAL
  Administered 2011-08-27: 1 via ORAL
  Administered 2011-08-28: 2 via ORAL
  Filled 2011-08-25: qty 1
  Filled 2011-08-25 (×4): qty 2

## 2011-08-25 MED ORDER — POVIDONE-IODINE 7.5 % EX SOLN
Freq: Once | CUTANEOUS | Status: DC
  Filled 2011-08-25: qty 118

## 2011-08-25 MED ORDER — DOCUSATE SODIUM 100 MG PO CAPS
100.0000 mg | ORAL_CAPSULE | Freq: Two times a day (BID) | ORAL | Status: DC
  Administered 2011-08-25 – 2011-08-28 (×5): 100 mg via ORAL
  Filled 2011-08-25 (×7): qty 1

## 2011-08-25 MED ORDER — LACTATED RINGERS IV SOLN
INTRAVENOUS | Status: DC | PRN
  Administered 2011-08-25 (×2): via INTRAVENOUS

## 2011-08-25 SURGICAL SUPPLY — 70 items
AUTOTRANSFUSION W/QD PVC DRAIN (AUTOTRANSFUSION) IMPLANT
BANDAGE ELASTIC 6 VELCRO ST LF (GAUZE/BANDAGES/DRESSINGS) ×2 IMPLANT
BANDAGE ESMARK 6X9 LF (GAUZE/BANDAGES/DRESSINGS) ×1 IMPLANT
BLADE SAGITTAL 25.0X1.19X90 (BLADE) ×2 IMPLANT
BLADE SAW SGTL 11.0X1.19X90.0M (BLADE) IMPLANT
BLADE SAW SGTL 13.0X1.19X90.0M (BLADE) ×2 IMPLANT
BLADE SURG 10 STRL SS (BLADE) ×4 IMPLANT
BNDG ELASTIC 6X15 VLCR STRL LF (GAUZE/BANDAGES/DRESSINGS) ×2 IMPLANT
BNDG ESMARK 6X9 LF (GAUZE/BANDAGES/DRESSINGS) ×2
BOWL SMART MIX CTS (DISPOSABLE) ×2 IMPLANT
CEMENT HV SMART SET (Cement) ×4 IMPLANT
CLOTH BEACON ORANGE TIMEOUT ST (SAFETY) ×2 IMPLANT
COVER BACK TABLE 24X17X13 BIG (DRAPES) IMPLANT
COVER PROBE W GEL 5X96 (DRAPES) ×2 IMPLANT
COVER SURGICAL LIGHT HANDLE (MISCELLANEOUS) ×2 IMPLANT
CUFF TOURNIQUET SINGLE 34IN LL (TOURNIQUET CUFF) ×2 IMPLANT
CUFF TOURNIQUET SINGLE 44IN (TOURNIQUET CUFF) IMPLANT
DRAPE EXTREMITY T 121X128X90 (DRAPE) ×2 IMPLANT
DRAPE INCISE IOBAN 66X45 STRL (DRAPES) ×2 IMPLANT
DRAPE PROXIMA HALF (DRAPES) ×2 IMPLANT
DRAPE U-SHAPE 47X51 STRL (DRAPES) ×2 IMPLANT
DRSG ADAPTIC 3X8 NADH LF (GAUZE/BANDAGES/DRESSINGS) ×2 IMPLANT
DRSG PAD ABDOMINAL 8X10 ST (GAUZE/BANDAGES/DRESSINGS) ×2 IMPLANT
DURAPREP 26ML APPLICATOR (WOUND CARE) ×2 IMPLANT
ELECT CAUTERY BLADE 6.4 (BLADE) ×2 IMPLANT
ELECT REM PT RETURN 9FT ADLT (ELECTROSURGICAL) ×2
ELECTRODE REM PT RTRN 9FT ADLT (ELECTROSURGICAL) ×1 IMPLANT
EVACUATOR 1/8 PVC DRAIN (DRAIN) IMPLANT
FACESHIELD LNG OPTICON STERILE (SAFETY) ×2 IMPLANT
GLOVE BIO SURGEON STRL SZ7 (GLOVE) ×2 IMPLANT
GLOVE BIOGEL PI IND STRL 7.0 (GLOVE) ×1 IMPLANT
GLOVE BIOGEL PI IND STRL 7.5 (GLOVE) ×1 IMPLANT
GLOVE BIOGEL PI INDICATOR 7.0 (GLOVE) ×1
GLOVE BIOGEL PI INDICATOR 7.5 (GLOVE) ×1
GLOVE SS BIOGEL STRL SZ 7.5 (GLOVE) ×1 IMPLANT
GLOVE SUPERSENSE BIOGEL SZ 7.5 (GLOVE) ×1
GOWN PREVENTION PLUS XLARGE (GOWN DISPOSABLE) ×4 IMPLANT
GOWN STRL NON-REIN LRG LVL3 (GOWN DISPOSABLE) ×4 IMPLANT
HANDPIECE INTERPULSE COAX TIP (DISPOSABLE) ×2
HOOD PEEL AWAY FACE SHEILD DIS (HOOD) ×4 IMPLANT
IMMOBILIZER KNEE 22 UNIV (SOFTGOODS) IMPLANT
INSERT CUSHION PRONEVIEW LG (MISCELLANEOUS) ×2 IMPLANT
KIT BASIN OR (CUSTOM PROCEDURE TRAY) ×2 IMPLANT
KIT ROOM TURNOVER OR (KITS) ×2 IMPLANT
MANIFOLD NEPTUNE II (INSTRUMENTS) ×2 IMPLANT
NEEDLE 18GX1X1/2 (RX/OR ONLY) (NEEDLE) ×2 IMPLANT
NS IRRIG 1000ML POUR BTL (IV SOLUTION) ×2 IMPLANT
PACK TOTAL JOINT (CUSTOM PROCEDURE TRAY) ×2 IMPLANT
PAD ARMBOARD 7.5X6 YLW CONV (MISCELLANEOUS) ×4 IMPLANT
PAD CAST 4YDX4 CTTN HI CHSV (CAST SUPPLIES) ×1 IMPLANT
PADDING CAST COTTON 4X4 STRL (CAST SUPPLIES) ×1
PADDING CAST COTTON 6X4 STRL (CAST SUPPLIES) ×2 IMPLANT
PADDING WEBRIL 4 STERILE (GAUZE/BANDAGES/DRESSINGS) ×2 IMPLANT
PADDING WEBRIL 6 STERILE (GAUZE/BANDAGES/DRESSINGS) ×2 IMPLANT
POSITIONER HEAD PRONE TRACH (MISCELLANEOUS) ×2 IMPLANT
SET HNDPC FAN SPRY TIP SCT (DISPOSABLE) ×1 IMPLANT
SPONGE GAUZE 4X4 12PLY (GAUZE/BANDAGES/DRESSINGS) ×2 IMPLANT
STRIP CLOSURE SKIN 1/2X4 (GAUZE/BANDAGES/DRESSINGS) ×2 IMPLANT
SUCTION FRAZIER TIP 10 FR DISP (SUCTIONS) ×2 IMPLANT
SUT ETHIBOND NAB CT1 #1 30IN (SUTURE) ×4 IMPLANT
SUT MNCRL AB 3-0 PS2 18 (SUTURE) ×2 IMPLANT
SUT VIC AB 0 CT1 27 (SUTURE) ×2
SUT VIC AB 0 CT1 27XBRD ANBCTR (SUTURE) ×2 IMPLANT
SUT VIC AB 2-0 CT1 27 (SUTURE) ×2
SUT VIC AB 2-0 CT1 TAPERPNT 27 (SUTURE) ×2 IMPLANT
SYR 30ML SLIP (SYRINGE) ×2 IMPLANT
TOWEL OR 17X24 6PK STRL BLUE (TOWEL DISPOSABLE) ×2 IMPLANT
TOWEL OR 17X26 10 PK STRL BLUE (TOWEL DISPOSABLE) ×2 IMPLANT
TRAY FOLEY CATH 14FR (SET/KITS/TRAYS/PACK) ×2 IMPLANT
WATER STERILE IRR 1000ML POUR (IV SOLUTION) ×6 IMPLANT

## 2011-08-25 NOTE — Anesthesia Procedure Notes (Signed)
Anesthesia Regional Block:  Femoral nerve block  Pre-Anesthetic Checklist: ,, timeout performed, Correct Patient, Correct Site, Correct Laterality, Correct Procedure,, site marked, risks and benefits discussed, Surgical consent,  Pre-op evaluation,  At surgeon's request and post-op pain management  Laterality: Right  Prep: chloraprep       Needles:  Injection technique: Single-shot  Needle Type: Echogenic Needle     Needle Length: 5cm 5 cm Needle Gauge: 21    Additional Needles:  Procedures: nerve stimulator Femoral nerve block  Nerve Stimulator or Paresthesia:  Response: Quadriceps muscle contraction, 0.45 mA,   Additional Responses:   Narrative:  Start time: 08/25/2011 7:05 AM End time: 08/25/2011 7:15 AM Injection made incrementally with aspirations every 5 mL.  Performed by: Personally  Anesthesiologist: Arta Bruce MD  Additional Notes: Functioning IV was confirmed and monitors were applied.  A 50mm 21ga Arrow echogenic stimulator needle was used. Sterile prep and drape,hand hygiene and sterile gloves were used.  Negative aspiration and negative test dose prior to incremental administration of local anesthetic. The patient tolerated the procedure well.    Femoral nerve block

## 2011-08-25 NOTE — Interval H&P Note (Signed)
History and Physical Interval Note:  08/25/2011 7:05 AM  Benjamin Adkins  has presented today for surgery, with the diagnosis of DJD RIGHT KNEE  The various methods of treatment have been discussed with the patient and family. After consideration of risks, benefits and other options for treatment, the patient has consented to  Procedure(s): TOTAL KNEE ARTHROPLASTY RIGHT as a surgical intervention .  The patients' history has been reviewed, patient examined, no change in status, stable for surgery.  I have reviewed the patients' chart and labs.  Questions were answered to the patient's satisfaction.     Salvatore Marvel A

## 2011-08-25 NOTE — H&P (View-Only) (Signed)
Benjamin Adkins is an 71 y.o. male.   Chief Complaint: right knee end stage DJD HPI: Benjamin Adkins is a 71 year old seen for follow-up for significant right knee end stage degenerative joint disease. We saw him on 11/25/10 and injected the right knee which helped for a month and pain has persisted. He had a left total knee replacement 6 years ago and this is doing well. His primary care physician is Dr. Alwyn Ren and his cardiologist is Dr. Eden Emms. He had a CABG in 2006 after his left total knee replacement when he had post-op MI and has been stable since. The excruciating pain is getting progressively worse, limiting activities of daily living, difficulty rising from a seated position, poses a significant fall risk, and has failed multiple conservative treatments including intraarticular cortisone injections,, bracing, medication and home physical therapy.  Past Medical History  Diagnosis Date  . Hyperlipidemia     nmr lipoprofile 2004: LDL 154 (1653/711), HDL 42, TG 80.NMR  LDL =,125, but PMH  of MI LDL goal=,70  . Unspecified essential hypertension   . Coronary atherosclerosis of unspecified type of vessel, native or graft   . Old myocardial infarction   . Personal history of colonic polyps   . Osteoarthrosis, unspecified whether generalized or localized, unspecified site   . Elevated prostate specific antigen (PSA)   . Other cataract   . Coronary atherosclerosis of artery bypass graft   . Esophageal reflux   . Sleep apnea     uses CPAP    Past Surgical History  Procedure Date  . Coronary artery bypass graft   . Cataract extraction   . Colonoscopy w/ polypectomy   . Inguinal hernia repair   . Total knee arthroplasty 2005    Family History  Problem Relation Age of Onset  . Cancer Father     stomach  . Stomach cancer Father   . Hypertension Mother   . Heart attack Mother   . Breast cancer Mother   . Coronary artery disease Mother   . Heart disease Mother   . Breast cancer Sister   .  Crohn's disease Sister   . Arthritis Sister   . Heart attack Paternal Grandfather 13  . Arthritis Brother    Social History:  reports that he has quit smoking. He does not have any smokeless tobacco history on file. He reports that he drinks about 2.4 ounces of alcohol per week. He reports that he does not use illicit drugs.  Allergies: No Known Allergies  Medications Prior to Admission  Medication Sig Dispense Refill  . aspirin 325 MG tablet Take 325 mg by mouth daily.        Marland Kitchen atorvastatin (LIPITOR) 40 MG tablet       . celecoxib (CELEBREX) 100 MG capsule Take 100 mg by mouth 2 (two) times daily as needed. Arthritis pain      . glucosamine-chondroitin 500-400 MG tablet Take 1 tablet by mouth daily.       . metoprolol succinate (TOPROL-XL) 25 MG 24 hr tablet       . Multiple Vitamin (MULTIVITAMIN) capsule Take 1 capsule by mouth daily.        . nitroGLYCERIN (NITROSTAT) 0.4 MG SL tablet Place 0.4 mg under the tongue every 5 (five) minutes as needed. Chest pain      . omeprazole (PRILOSEC) 20 MG capsule Take 20 mg by mouth daily.        No current facility-administered medications on file as of  08/12/2011.    No results found for this or any previous visit (from the past 48 hour(s)). No results found.  Review of Systems  Constitutional: Negative for fever, chills, weight loss, malaise/fatigue and diaphoresis.  HENT: Negative for hearing loss, ear pain, nosebleeds, congestion, sore throat, neck pain, tinnitus and ear discharge.   Eyes: Negative for blurred vision, double vision, photophobia, pain, discharge and redness.  Respiratory: Positive for shortness of breath. Negative for cough, hemoptysis, sputum production, wheezing and stridor.   Cardiovascular: Positive for leg swelling. Negative for chest pain, palpitations, orthopnea, claudication and PND.       Left leg mild swelling s/p vein harvest for CABG in 2006  Gastrointestinal: Negative for heartburn, nausea, vomiting, abdominal  pain, diarrhea, constipation, blood in stool and melena.  Genitourinary: Negative for dysuria, urgency, frequency, hematuria and flank pain.  Musculoskeletal: Positive for back pain and joint pain. Negative for myalgias and falls.       Right knee pain  Skin: Negative for itching and rash.  Neurological: Negative for dizziness, tingling, tremors, sensory change, speech change, focal weakness, seizures, loss of consciousness, weakness and headaches.  Endo/Heme/Allergies: Negative for environmental allergies and polydipsia. Does not bruise/bleed easily.  Psychiatric/Behavioral: Negative for depression, suicidal ideas, hallucinations, memory loss and substance abuse. The patient is not nervous/anxious and does not have insomnia.     Blood pressure 147/83, pulse 68, temperature 97.8 F (36.6 C), height 5\' 8"  (1.727 m), weight 92.534 kg (204 lb), SpO2 96.00%. Physical Exam  Constitutional: He is oriented to person, place, and time. He appears well-developed and well-nourished.  HENT:  Head: Normocephalic and atraumatic.  Mouth/Throat: Oropharynx is clear and moist.  Eyes: Conjunctivae and EOM are normal. Pupils are equal, round, and reactive to light.  Neck: Normal range of motion. Neck supple.  Cardiovascular: Normal rate and regular rhythm.   Murmur heard. Respiratory: Effort normal and breath sounds normal.  GI: Soft. Bowel sounds are normal.  Genitourinary:       Not pertinent to current symptomatology therefore not examined.  Musculoskeletal:       Examination of his right knee reveals significant varus deformity diffuse pain 1+ crepitation 1+ synovitis range of motion 0-120 degrees knee is stable with normal patella tracking. Left knee reveals well healed total knee incisions without swelling or pain, range of motion 0-120 degrees knee is stable with normal patella tracking.   Neurological: He is alert and oriented to person, place, and time.  Skin: Skin is warm and dry.  Psychiatric:  He has a normal mood and affect. His behavior is normal. Judgment and thought content normal.   XRAY: AP,LATERAL, FLEXION, and SUNRISE views show significant joint space narrowing, with periarticular osteophytes, and subchondral sclerosis.   Assessment Patient Active Problem List  Diagnoses  . HYPERLIPIDEMIA  . AFTER-CATARACT NEC  . CATARACT NEC  . HYPERTENSION, ESSENTIAL NOS  . MYOCARDIAL INFARCTION, HX OF  . CAD  . RBBB  . GERD  . OSTEOARTHRITIS  . ARTHRITIS  . SLEEP APNEA, CHRONIC  . OTHER ABNORMAL GLUCOSE  . ELEVATED PROSTATE SPECIFIC ANTIGEN  . ABNORMAL ELECTROCARDIOGRAM  . COLONIC POLYPS, HX OF  . Arthritis of right knee       Plan We inject the right knee under sterile conditions with Depo-Medrol/Marcaine and he tolerates this well. He wants to proceed with right total knee replacement in the near future. He needs to be cleared by Dr. Eden Emms.  He has an appointment tomorrow with [facility]  for surgical clearanceDiscussed risks benefits and possible complications of the surgery in detail and he understands this completely.   Earlisha Sharples J 08/12/2011, 3:06 PM

## 2011-08-25 NOTE — Anesthesia Postprocedure Evaluation (Signed)
Anesthesia Post Note  Patient: Benjamin Adkins  Procedure(s) Performed:  TOTAL KNEE ARTHROPLASTY - DR Anne Ng 90 MINUTES FOR SURGERY  Anesthesia type: general  Patient location: PACU  Post pain: Pain level controlled  Post assessment: Patient's Cardiovascular Status Stable  Last Vitals:  Filed Vitals:   08/25/11 0930  BP:   Temp: 36.3 C    Post vital signs: Reviewed and stable  Level of consciousness: sedated  Complications: No apparent anesthesia complications

## 2011-08-25 NOTE — Progress Notes (Signed)
CPM APPLIED TO RIGHT KNEE AT 0-90 DEGREES. PT TOL WELL.

## 2011-08-25 NOTE — Progress Notes (Signed)
RT called to PACU to place patient on CPAP.  Patient had his home mask but did not know his settings.  Set patient up on our machine with his nasal mask and bled in 4L O2.  Machine is set to [employer] with a pressure of 7 cmH2O.  Patient is tolerating well at this time.  RT will continue to monitor.

## 2011-08-25 NOTE — Anesthesia Preprocedure Evaluation (Addendum)
Anesthesia Evaluation  Patient identified by MRN, date of birth, ID band Patient awake    Reviewed: Allergy & Precautions, H&P , NPO status , Patient's Chart, lab work & pertinent test results, reviewed documented beta blocker date and time   Airway Mallampati: II TM Distance: >3 FB Neck ROM: Full    Dental  (+) Teeth Intact and Dental Advisory Given   Pulmonary sleep apnea and Continuous Positive Airway Pressure Ventilation ,          Cardiovascular hypertension, Pt. on home beta blockers and Pt. on medications + CAD, + Past MI and + Cardiac Stents + dysrhythmias     Neuro/Psych    GI/Hepatic GERD-  Controlled and Medicated,  Endo/Other    Renal/GU      Musculoskeletal   Abdominal   Peds  Hematology   Anesthesia Other Findings   Reproductive/Obstetrics                          Anesthesia Physical Anesthesia Plan  ASA: III  Anesthesia Plan: General   Post-op Pain Management: MAC Combined w/ Regional for Post-op pain   Induction: Intravenous  Airway Management Planned: Oral ETT  Additional Equipment:   Intra-op Plan:   Post-operative Plan: Extubation in OR  Informed Consent: I have reviewed the patients History and Physical, chart, labs and discussed the procedure including the risks, benefits and alternatives for the proposed anesthesia with the patient or authorized representative who has indicated his/her understanding and acceptance.   Dental advisory given  Plan Discussed with: CRNA and Surgeon  Anesthesia Plan Comments:        Anesthesia Quick Evaluation

## 2011-08-25 NOTE — Plan of Care (Signed)
Problem: Consults Goal: Diagnosis- Total Joint Replacement Primary Total Knee     

## 2011-08-25 NOTE — Transfer of Care (Signed)
Immediate Anesthesia Transfer of Care Note  Patient: Benjamin Adkins  Procedure(s) Performed:  TOTAL KNEE ARTHROPLASTY - DR Anne Ng 90 MINUTES FOR SURGERY  Patient Location: PACU  Anesthesia Type: General and GA combined with regional for post-op pain  Level of Consciousness: sedated  Airway & Oxygen Therapy: Patient Spontanous Breathing, Patient connected to nasal cannula oxygen and Patient connected to face mask oxygen  Post-op Assessment: Report given to PACU RN, Post -op Vital signs reviewed and stable and Patient moving all extremities  Post vital signs: Reviewed and stable  Complications: No apparent anesthesia complications

## 2011-08-25 NOTE — Progress Notes (Signed)
Placed pt on CPAP with home nasal mask set at 7.0 for rest. Pt tolerating well with O2 bleed in at previous flow of 4L. RT will continue to monitor.

## 2011-08-25 NOTE — Preoperative (Signed)
Beta Blockers   Reason not to administer Beta Blockers:pt took toprol 08-25-11

## 2011-08-25 NOTE — Brief Op Note (Signed)
08/25/2011  9:00 AM  PATIENT:  Benjamin Adkins  72 y.o. male  PRE-OPERATIVE DIAGNOSIS:  Degenerative joint disease, right knee  POST-OPERATIVE DIAGNOSIS:  Degenerative joint disease, right knee  PROCEDURE:  Procedure(s): TOTAL KNEE ARTHROPLASTY RIGHT  SURGEON:  Surgeon(s): Nilda Simmer, MD  PHYSICIAN ASSISTANT: Julien Girt PA-C  ASSISTANTS: Julien Girt PA-C   ANESTHESIA:   general  EBL:  Total I/O In: 1000 [I.V.:1000] Out: 100 [Urine:100]  BLOOD ADMINISTERED:none  DRAINS: (2) Hemovact drain(s) in the RIGHT with  Suction Clamped   LOCAL MEDICATIONS USED:  NONE  SPECIMEN:  No Specimen  DISPOSITION OF SPECIMEN:  N/A  COUNTS:  YES  TOURNIQUET:   Total Tourniquet Time Documented: Thigh (Right) - 61 minutes  DICTATION: .Note written in EPIC  PLAN OF CARE: Admit to inpatient   PATIENT DISPOSITION:  PACU - hemodynamically stable.   Delay start of Pharmacological VTE agent (>24hrs) due to surgical blood loss or risk of bleeding:  NO

## 2011-08-25 NOTE — Progress Notes (Signed)
Orthopedic Tech Progress Note Patient Details:  Benjamin Adkins July 30, 1940 [id number]  CPM Right Knee Right Knee Flexion (Degrees): 90  Right Knee Extension (Degrees): 0  Additional Comments: trapeze bar   Shawnie Pons 08/25/2011, 5:23 PM

## 2011-08-26 ENCOUNTER — Encounter (HOSPITAL_COMMUNITY): Payer: Self-pay | Admitting: Orthopedic Surgery

## 2011-08-26 DIAGNOSIS — D62 Acute posthemorrhagic anemia: Secondary | ICD-10-CM

## 2011-08-26 LAB — BASIC METABOLIC PANEL
BUN: 18 mg/dL (ref 6–23)
Chloride: 101 mEq/L (ref 96–112)
Creatinine, Ser: 0.89 mg/dL (ref 0.50–1.35)
GFR calc Af Amer: >90 mL/min (ref >90)

## 2011-08-26 LAB — CBC
HCT: 34.3 % — ABNORMAL LOW (ref 39.0–52.0)
MCHC: 35.6 g/dL (ref 30.0–36.0)
MCV: 88.9 fL (ref 78.0–100.0)
RDW: 12.7 % (ref 11.5–15.5)
WBC: 13.6 10*3/uL — ABNORMAL HIGH (ref 4.0–10.5)

## 2011-08-26 MED ORDER — METOCLOPRAMIDE HCL 5 MG/ML IJ SOLN
5.0000 mg | Freq: Four times a day (QID) | INTRAMUSCULAR | Status: AC
  Filled 2011-08-26 (×8): qty 2

## 2011-08-26 MED ORDER — SODIUM CHLORIDE 0.9 % IV BOLUS (SEPSIS)
500.0000 mL | Freq: Once | INTRAVENOUS | Status: AC
  Administered 2011-08-26: 500 mL via INTRAVENOUS

## 2011-08-26 MED ORDER — METOCLOPRAMIDE HCL 5 MG PO TABS
5.0000 mg | ORAL_TABLET | Freq: Four times a day (QID) | ORAL | Status: AC
  Administered 2011-08-26 – 2011-08-28 (×5): 10 mg via ORAL
  Filled 2011-08-26 (×8): qty 2

## 2011-08-26 NOTE — Progress Notes (Signed)
OT Cancellation Note  Treatment cancelled today due to pt in CPM and requested to begin OT in am..  [facility], OTR/L  [phone] 08/26/2011 08/26/2011, 3:45 PM

## 2011-08-26 NOTE — Progress Notes (Signed)
CSW Psychosocial Assessment and FL2 placed in shadow chart in wallaroo. Will proceed with SNF plans for [facility].  Reece Levy, MSW, Theresia Majors [phone]

## 2011-08-26 NOTE — Op Note (Signed)
NAMEREMO, KIRSCHENMANN NO.:  [id number]  MEDICAL RECORD NO.:  [id number]  LOCATION:  5028                         FACILITY:  [facility]  PHYSICIAN:  Sylvan Lahm A. Thurston Hole, M.D. DATE OF BIRTH:  Sep 05, 1940  DATE OF PROCEDURE:  08/25/2011 DATE OF DISCHARGE:                              OPERATIVE REPORT   PREOPERATIVE DIAGNOSIS:  Right knee degenerative joint disease.  POSTOPERATIVE DIAGNOSIS:  Right knee degenerative joint disease.  PROCEDURE: 1. Right total knee replacement using DePuy cemented total knee system     with #4 cemented femur, #4 cemented tibia with 15-mm polyethylene     RP tibial spacer, and 35-mm polyethylene cemented patella. 2. Zinacef impregnated cement.  SURGEON:  Elana Alm. Thurston Hole, MD  ASSISTANT:  Julien Girt, PA-C  ANESTHESIA:  General.  OPERATIVE TIME:  1 hour and 30 minutes.  COMPLICATIONS:  None.  DESCRIPTION OF PROCEDURE:  Mr. Jezewski was brought to the operating room on August 25, 2011, after a femoral nerve block was placed in the holding room by Anesthesia.  He was placed on operative table in supine position.  He received Ancef 2 g IV preoperatively for prophylaxis. After being placed under general anesthesia, he had a Foley catheter placed under sterile conditions.  His right knee was examined.  He had range of motion from -10 to 125 degrees with varus deformity, knee stable, ligamentous exam with normal patellar tracking.  Right leg was prepped using sterile DuraPrep and draped using sterile technique.  Time- out procedure was called and the correct right knee identified.  The right leg was exsanguinated and a thigh tourniquet elevated to 365 mm. Initially, through a 15-cm longitudinal incision based over the patella, initial exposure was made.  The subcutaneous tissues were incised along with skin incision.  A median arthrotomy was performed revealing an excessive amount of normal-appearing joint fluid.  The  articular surfaces were inspected.  He had grade 4 changes medially, laterally, and in the patellofemoral joint.  Osteophytes removed from the femoral condyles and tibial plateau.  The medial and lateral meniscal remnants were removed as well as the anterior cruciate ligament.  Intramedullary drill was then drilled up the femoral canal for placement of distal femoral cutting jig, which was placed in the appropriate manner of rotation and a distal 13-mm cut was made.  Distal femur was incised.  A #4 was found to be the appropriate size.  A #4 cutting jig was placed in the appropriate manner of external rotation and then these cuts were made.  The proximal tibia was then exposed.  The tibial spines were removed with an oscillating saw.  Intramedullary drill was drilled down the tibial canal for placement of proximal tibial cutting jig, which was placed in the appropriate manner of rotation and a proximal 6-mm cut was made based off the medial or lower side.  Spacer blocks were then placed in flexion and extension.  A 15-mm block gave excellent balancing, excellent stability, and excellent correction of his varus deformities. At this point, the proximal tibia was exposed, #4 tibial baseplate trial was placed on the cut tibial surface with an excellent fit and a keel cut was made.  The PCL box cutter was then placed on the distal femur and these cuts were made.  At this point, with the #4 femoral trial in place and a #4 tibial baseplate trial, and a 15-mm polyethylene RP tibial spacer, the knee was reduced and taken through a range of motion from 0-125 degrees with excellent stability and excellent correction of his flexion and varus deformities and normal patellar tracking.  A resurfacing 9-mm cut was then made on the patella and 3 locking holes placed for a 35-mm polyethylene patellar trial and again, patellofemoral tracking was evaluated and found to be normal.  At this point, it was felt  that all the trial components were of excellent size, fit, and stability.  The trial components were then removed, and the wound was irrigated with saline and 3 L of jet lavage solution.  The proximal tibia was then exposed and the #4 tibial baseplate with Zinacef impregnated cement backing was hammered in position with an excellent fit with excess cement being removed from around the edges.  The #4 femoral component with cement backing was hammered into position also with an excellent fit with excess cement being removed from around the edges.  The 15-mm polyethylene RP tibial spacer was placed on tibial baseplate.  The knee reduced, taken through range of motion from 0-125 degrees with excellent stability and excellent correction of his flexion and varus deformities.  The 35-mm polyethylene cement backed patella was then placed in its position and held there with a clamp.  After the cement hardened, again patellofemoral tracking was evaluated and found to be normal.  At this point, it was felt that all the components were of excellent size, fit, and stability.  The wound was further irrigated with saline, and then the tourniquet was released.  Hemostasis was obtained with cautery.  The arthrotomy was then closed with #1 Ethibond suture over 2 medium Hemovac drains.  Subcutaneous tissues closed with 0 and 2-0 Vicryl, subcuticular layer closed with 4-0 Monocryl.  Sterile dressings were applied and a long-leg splint and the patient awakened and taken to recovery room in stable condition.  Needle and sponge count was correct x2 at the end of the case.  Neurovascular status normal. Pulses 2+ and symmetric.     Farrie Sann A. Thurston Hole, M.D.     RAW/MEDQ  D:  08/25/2011  T:  08/26/2011  Job:  [id number]

## 2011-08-26 NOTE — Progress Notes (Signed)
UR COMPLETED  

## 2011-08-26 NOTE — Progress Notes (Signed)
Physical Therapy Evaluation Patient Details Name: Benjamin Adkins MRN: [id number] DOB: 1940/11/22 Today's Date: 08/26/2011  Problem List:  Patient Active Problem List  Diagnoses  . HYPERLIPIDEMIA  . AFTER-CATARACT NEC  . CATARACT NEC  . HYPERTENSION, ESSENTIAL NOS  . MYOCARDIAL INFARCTION, HX OF  . CAD  . RBBB  . GERD  . OSTEOARTHRITIS  . ARTHRITIS  . SLEEP APNEA, CHRONIC  . OTHER ABNORMAL GLUCOSE  . ELEVATED PROSTATE SPECIFIC ANTIGEN  . ABNORMAL ELECTROCARDIOGRAM  . COLONIC POLYPS, HX OF  . Arthritis of right knee  . Preoperative clearance  . Postoperative anemia due to acute blood loss    Past Medical History:  Past Medical History  Diagnosis Date  . Hyperlipidemia     nmr lipoprofile 2004: LDL 154 (1653/711), HDL 42, TG 80.NMR  LDL =,125, but PMH  of MI LDL goal=,70  . Unspecified essential hypertension   . Coronary atherosclerosis of unspecified type of vessel, native or graft   . Personal history of colonic polyps   . Osteoarthrosis, unspecified whether generalized or localized, unspecified site   . Elevated prostate specific antigen (PSA)   . Other cataract   . Coronary atherosclerosis of artery bypass graft   . Esophageal reflux   . Sleep apnea     uses CPAP  . Old myocardial infarction     2006  . Shortness of breath     DOE   Past Surgical History:  Past Surgical History  Procedure Date  . Cataract extraction   . Colonoscopy w/ polypectomy   . Inguinal hernia repair   . Total knee arthroplasty 2005  . Coronary artery bypass graft     X 4   . Cardiac catheterization     2006  . Hernia repair     VENTRAL, LEFT & RIGHT GROIN  . Joint replacement     PT Assessment/Plan/Recommendation PT Assessment Clinical Impression Statement: Pt s/p R TKA presenting with increased R knee pain, decreased R LE strength and R active knee ROM. Patient to go to SNF upon d/c due to patient living home alone. Patient to benefit from skilled PT while in hospital and upon  d/c for maximal functional recovery. PT Recommendation/Assessment: Patient will need skilled PT in the acute care venue PT Problem List: Decreased strength;Decreased range of motion;Decreased activity tolerance;Decreased balance;Decreased mobility Barriers to Discharge: Decreased caregiver support PT Therapy Diagnosis : Difficulty walking;Abnormality of gait;Generalized weakness;Acute pain PT Plan PT Frequency: 7X/week PT Treatment/Interventions: DME instruction;Gait training;Stair training;Functional mobility training;Therapeutic activities;Therapeutic exercise PT Recommendation Follow Up Recommendations: Skilled nursing facility Equipment Recommended: None recommended by PT PT Goals  Acute Rehab PT Goals PT Goal Formulation: With patient Time For Goal Achievement: 7 days Pt will go Supine/Side to Sit: with modified independence PT Goal: Supine/Side to Sit - Progress: Goal set today Pt will go Sit to Stand: with modified independence PT Goal: Sit to Stand - Progress: Goal set today Pt will Ambulate: >150 feet;with modified independence;with rolling walker PT Goal: Ambulate - Progress: Goal set today Pt will Go Up / Down Stairs: 3-5 stairs;with supervision;with rail(s) PT Goal: Up/Down Stairs - Progress: Goal set today Pt will Perform Home Exercise Program: Independently PT Goal: Perform Home Exercise Program - Progress: Goal set today  PT Evaluation Precautions/Restrictions  Precautions Precautions: Knee Required Braces or Orthoses: Yes Knee Immobilizer: On when out of bed or walking (R LE) Restrictions Weight Bearing Restrictions: Yes RLE Weight Bearing: Weight bearing as tolerated Prior Functioning  Home Living Lives With:  Alone (patient going to [facility] upon d/c) Receives Help From: Family Type of Home: House Home Layout: Multi-level Alternate Level Stairs-Rails: Right Alternate Level Stairs-Number of Steps: 6 Home Access: Stairs to enter Entrance Stairs-Rails:  Right Entrance Stairs-Number of Steps: 3 Bathroom Shower/Tub: Health visitor:  (has 3-n-1 commode) Bathroom Accessibility: Yes How Accessible: Accessible via walker Home Adaptive Equipment: Bedside commode/3-in-1;Walker - rolling Prior Function Level of Independence: Independent with basic ADLs;Independent with homemaking with ambulation;Independent with gait;Independent with transfers Able to Take Stairs?: Yes Driving: Yes Vocation: Retired 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[employer]) Cognition Cognition Arousal/Alertness: Awake/alert Overall Cognitive Status: Appears within functional limits for tasks assessed Orientation Level: Oriented X4 Sensation/Coordination Sensation Light Touch: Appears Intact  Pain: 6/10  Extremity Assessment RUE Assessment RUE Assessment: Within Functional Limits LUE Assessment LUE Assessment: Within Functional Limits RLE Assessment RLE Assessment: Not tested (pt s/p R TKA) LLE Assessment LLE Assessment: Within Functional Limits Mobility (including Balance) Bed Mobility Bed Mobility: Yes Supine to Sit: 4: Min assist Supine to Sit Details (indicate cue type and reason): pt with minimal use of hand rails, assist for R LE managment Transfers Transfers: Yes Sit to Stand: 4: Min assist Sit to Stand Details (indicate cue type and reason): verbal cues for hand placement and R LE positioning Ambulation/Gait Ambulation/Gait: Yes Ambulation/Gait Assistance: 4: Min assist Ambulation Distance (Feet): 12 Feet Assistive device: Rolling walker Gait Pattern: Step-to pattern;Decreased step length - right;Decreased stance time - right;Antalgic Gait velocity: decreased cadence Stairs: No  Posture/Postural Control Posture/Postural Control: No significant limitations Balance Balance Assessed: No Exercise  Total Joint Exercises Ankle Circles/Pumps: Both;10 reps;Supine Quad Sets: AROM;Both;10 reps;Supine Heel Slides: AAROM;Right;10 reps;Supine End of  Session PT - End of Session Equipment Utilized During Treatment: Gait belt;Right knee immobilizer Activity Tolerance: Patient tolerated treatment well (pt c/o nausea) Patient left: in chair;with call bell in reach;with family/visitor present Nurse Communication: Mobility status for transfers;Mobility status for ambulation General Behavior During Session: [facility] for tasks performed Cognition: [facility] for tasks performed  Marcene Brawn 08/26/2011, 11:44 AM  Lewis Shock, PT, DPT Pager #: [phone] Office #: [phone]

## 2011-08-26 NOTE — Progress Notes (Signed)
CARE MANAGEMENT NOTE 08/26/2011 Discharge planning. Patient is for shortterm rehab at [facility].

## 2011-08-26 NOTE — Progress Notes (Signed)
Spoke with patient regarding cpap.  Cpap setup in room at previous settings used in Pacu, 7cm h2o.  Pt stated he was comfortable with that pressure last night.  Pt advised me that he will place cpap mask on himself later when ready. Rn advised.

## 2011-08-26 NOTE — Progress Notes (Signed)
Physical Therapy Treatment Patient Details Name: Benjamin Adkins MRN: [id number] DOB: [birth year] Today's Date: 08/26/2011  PT Assessment/Plan  PT - Assessment/Plan Comments on Treatment Session: Patient s/p R TKA. Patient progressed very well today. Able to increase ambulation this afternoon.  PT Plan: Discharge plan remains appropriate PT Frequency: 7X/week Follow Up Recommendations: Skilled nursing facility Equipment Recommended: None recommended by PT PT Goals  Acute Rehab PT Goals PT Goal Formulation: With patient Time For Goal Achievement: 7 days Pt will go Supine/Side to Sit: with modified independence PT Goal: Supine/Side to Sit - Progress: Goal set today Pt will go Sit to Stand: with modified independence PT Goal: Sit to Stand - Progress: Progressing toward goal Pt will Ambulate: >150 feet;with modified independence;with rolling walker PT Goal: Ambulate - Progress: Progressing toward goal Pt will Go Up / Down Stairs: 3-5 stairs;with supervision;with rail(s) PT Goal: Up/Down Stairs - Progress: Goal set today Pt will Perform Home Exercise Program: Independently PT Goal: Perform Home Exercise Program - Progress: Progressing toward goal  PT Treatment Precautions/Restrictions  Precautions Precautions: Knee Required Braces or Orthoses: Yes Knee Immobilizer: On when out of bed or walking Restrictions Weight Bearing Restrictions: Yes RLE Weight Bearing: Weight bearing as tolerated Mobility (including Balance) Bed Mobility Bed Mobility: Yes Supine to Sit: 4: Min assist Supine to Sit Details (indicate cue type and reason): pt with minimal use of hand rails, assist for R LE managment Sit to Supine: 5: Supervision;With rail Sit to Supine - Details (indicate cue type and reason): Patient utilized tucking left LE under right LE to elevate onto bed.  Transfers Transfers: Yes Sit to Stand: Other (comment);From chair/3-in-1;With upper extremity assist;With armrests (MinGuard A) Sit  to Stand Details (indicate cue type and reason): Patient with good technique Stand to Sit: 5: Supervision;To bed;With upper extremity assist Stand to Sit Details: Cues for safe hand placement Ambulation/Gait Ambulation/Gait: Yes Ambulation/Gait Assistance: Other (comment) (MiNGuard A) Ambulation Distance (Feet): 90 Feet Assistive device: Rolling walker Gait Pattern: Step-to pattern;Decreased stride length Gait velocity: decreased Stairs: No  Posture/Postural Control Posture/Postural Control: No significant limitations Balance Balance Assessed: No Exercise  Total Joint Exercises Ankle Circles/Pumps: Both;10 reps;Supine Quad Sets: AROM;Strengthening;Right;10 reps Heel Slides: AAROM;Strengthening;Right;10 reps Straight Leg Raises: AAROM;Strengthening;Right;5 reps End of Session PT - End of Session Equipment Utilized During Treatment: Gait belt;Right knee immobilizer Activity Tolerance: Patient tolerated treatment well Patient left: in bed;in CPM Nurse Communication: Mobility status for transfers;Mobility status for ambulation General Behavior During Session: [facility] for tasks performed Cognition: [facility] for tasks performed  Robinette, Adline Potter 08/26/2011, 2:23 PM 08/26/2011 Fredrich Birks PTA [phone] pager [phone] office

## 2011-08-26 NOTE — Progress Notes (Signed)
Patient ID: Benjamin Adkins, male   DOB: August 01, 1940, 71 y.o.   MRN: [id number] PATIENT ID: Benjamin Adkins  MRN: [id number]  DOB/AGE:  Feb 27, 1941 / 71 y.o.  1 Day Post-Op Procedure(s) (LRB): TOTAL KNEE ARTHROPLASTY (Right)    PROGRESS NOTE Subjective: Patient is alert, oriented, yes Nausea, no Vomiting, no passing gas, no Bowel Movement. Taking PO OK. Denies SOB, Chest or Calf Pain. Using Incentive Spirometer, PAS in place. Ambulate not yet, CPM 0-90 Patient reports pain as 5 on 0-10 scale  .    Objective: Vital signs in last 24 hours: Filed Vitals:   08/25/11 1200 08/25/11 1400 08/25/11 2154 08/26/11 0629  BP: 150/79 132/75 125/64 128/62  Pulse: 66 70 67 62  Temp: 97.3 F (36.3 C) 97 F (36.1 C) 97.9 F (36.6 C) 97.7 F (36.5 C)  TempSrc: Oral Oral    Resp: 16 18 18 18   SpO2: 92% 94% 93% 96%      Intake/Output from previous day: I/O last 3 completed shifts: In: 2000 [I.V.:2000] Out: 2350 [Urine:1425; Drains:850; Blood:75]   Intake/Output this shift:     LABORATORY DATA:  Basename 08/26/11 0530  WBC 13.6*  HGB 12.2*  HCT 34.3*  PLT 152  NA 137  K 4.3  CL 101  CO2 30  BUN 18  CREATININE 0.89  GLUCOSE 117*  GLUCAP --  INR --  CALCIUM 8.6    Examination: ABD soft Neurovascular intact Sensation intact distally Intact pulses distally Dorsiflexion/Plantar flexion intact Incision: dressing C/D/I Compartment soft}  Assessment:   1 Day Post-Op Procedure(s) (LRB): TOTAL KNEE ARTHROPLASTY (Right) ADDITIONAL DIAGNOSIS:  Acute Blood Loss Anemia, Hypertension, Sleep Apnea and nausea, coronary artery disease  Plan: PT/OT WBAT, CPM 5/hrs day until ROM 0-90 degrees, then D/C CPM DVT Prophylaxis:  Lovenox\Coumadin bridge target INR 1.5-2.0 DISCHARGE PLAN: Skilled Nursing Facility/Rehab  NEEDS: IV fluid bolus, reglan, FL2 on chart, saline lock this afternoon     Lorraina Spring J 08/26/2011, 7:49 AM

## 2011-08-27 LAB — BASIC METABOLIC PANEL
BUN: 12 mg/dL (ref 6–23)
Calcium: 8.6 mg/dL (ref 8.4–10.5)
Creatinine, Ser: 0.95 mg/dL (ref 0.50–1.35)
GFR calc Af Amer: >90 mL/min (ref >90)
GFR calc non Af Amer: 82 mL/min — ABNORMAL LOW (ref >90)
Potassium: 3.7 mEq/L (ref 3.5–5.1)

## 2011-08-27 LAB — CBC
HCT: 31.4 % — ABNORMAL LOW (ref 39.0–52.0)
MCH: 31.6 pg (ref 26.0–34.0)
MCHC: 34.7 g/dL (ref 30.0–36.0)
RDW: 12.8 % (ref 11.5–15.5)

## 2011-08-27 MED ORDER — MAGNESIUM CITRATE PO SOLN
1.0000 | Freq: Once | ORAL | Status: DC
  Filled 2011-08-27: qty 296

## 2011-08-27 MED ORDER — MAGNESIUM HYDROXIDE 400 MG/5ML PO SUSP
30.0000 mL | Freq: Every day | ORAL | Status: AC
  Administered 2011-08-27: 30 mL via ORAL
  Filled 2011-08-27: qty 30

## 2011-08-27 NOTE — Progress Notes (Signed)
Physical Therapy Treatment Patient Details Name: BURDELL PEED MRN: [id number] DOB: September 24, 1940 Today's Date: 08/27/2011  PT Assessment/Plan  PT - Assessment/Plan Comments on Treatment Session: Pt still progressing well but states today that he is more sore and that he didn't sleep well last night.  PT Plan: Discharge plan remains appropriate PT Frequency: 7X/week Follow Up Recommendations: Skilled nursing facility Equipment Recommended: None recommended by PT PT Goals  Acute Rehab PT Goals PT Goal: Sit to Stand - Progress: Progressing toward goal PT Goal: Ambulate - Progress: Progressing toward goal PT Goal: Perform Home Exercise Program - Progress: Progressing toward goal  PT Treatment Precautions/Restrictions  Precautions Precautions: Knee Required Braces or Orthoses: Yes Knee Immobilizer: On when out of bed or walking Restrictions Weight Bearing Restrictions: No RLE Weight Bearing: Weight bearing as tolerated Mobility (including Balance) Bed Mobility Sit to Supine: 6: Modified independent (Device/Increase time) Transfers Sit to Stand: 5: Supervision;From chair/3-in-1;With upper extremity assist Stand to Sit: With upper extremity assist;4: Min assist;To bed Stand to Sit Details: A to control descent onto bed. Cues for safe hand placement Ambulation/Gait Ambulation/Gait Assistance: 4: Min assist;Other (comment) (MinGuardA) Ambulation/Gait Assistance Details (indicate cue type and reason): Cues to decrease weight through UEs and attempt to increase weight on LEs Ambulation Distance (Feet): 80 Feet Assistive device: Rolling walker Gait Pattern: Step-to pattern;Decreased stance time - right;Decreased stride length;Trunk flexed Gait velocity: decreased    Exercise  Total Joint Exercises Ankle Circles/Pumps: AROM;Both;10 reps Quad Sets: AROM;Right;10 reps Short Arc QuadBarbaraann Boys;Right;10 reps Heel Slides: AAROM;Right;10 reps Hip ABduction/ADduction: AAROM;Right;10 reps End  of Session PT - End of Session Equipment Utilized During Treatment: Gait belt Activity Tolerance: Patient limited by fatigue;Patient limited by pain Patient left: in bed Nurse Communication: Mobility status for transfers;Mobility status for ambulation General Behavior During Session: [facility] for tasks performed Cognition: [facility] for tasks performed  Fredrich Birks 08/27/2011, 10:18 AM 08/27/2011 Fredrich Birks PTA [phone] pager [phone] office

## 2011-08-27 NOTE — Progress Notes (Signed)
Pt seen to assess for cpap. Pt stated that pressure was a little too much for him.  Cpap setting decreased from 7 to 6cm h2o per pt comfort with 4l o2 bleedin.  Pt is wearing his nasal mask from home and seems to tolerate cpap better  At this time.  Pt's hr 100, sats 92%.

## 2011-08-27 NOTE — Progress Notes (Signed)
Consult received.  Noted that pt. Is scheduled for D/C to SNF tomorrow 08/28/11.  Will defer OT eval to SNF.  If D/C plans change, will then initiate acute OT eval.  Thanks!  Jeani Hawking, OTR/L [phone]

## 2011-08-27 NOTE — Progress Notes (Signed)
SNF bed confirmed for patient at [facility] tentatively for tomorrow- will follow up in a.m. Reece Levy, MSW, Theresia Majors [phone]

## 2011-08-27 NOTE — Progress Notes (Signed)
Patient ID: Benjamin Adkins, male   DOB: [birth year], 71 y.o.   MRN: [id number] PATIENT ID: Benjamin Adkins  MRN: [id number]  DOB/AGE:  August 01, 1940 / 71 y.o.  2 Days Post-Op Procedure(s) (LRB): TOTAL KNEE ARTHROPLASTY (Right)    PROGRESS NOTE Subjective: Patient is alert, oriented, less Nausea, no Vomiting, yes} passing gas, yes Bowel Movement. Taking PO yes Denies SOB, Chest or Calf Pain. Using Incentive Spirometer, PAS in place. Ambulate well, CPM 0-70 Patient reports pain as 6 on 0-10 scale  .    Objective: Vital signs in last 24 hours: Filed Vitals:   08/26/11 0629 08/26/11 1400 08/26/11 2204 08/27/11 0652  BP: 128/62 113/57 144/78 131/73  Pulse: 62 82 96 117  Temp: 97.7 F (36.5 C) 99.6 F (37.6 C) 99.4 F (37.4 C) 99.5 F (37.5 C)  TempSrc:      Resp: 18 18 19 18   SpO2: 96% 92% 92% 94%      Intake/Output from previous day: I/O last 3 completed shifts: In: 1580 [P.O.:1080; IV Piggyback:500] Out: 3050 [Urine:2350; Drains:700]   Intake/Output this shift:     LABORATORY DATA:  Basename 08/27/11 0515 08/26/11 0530  WBC 9.7 13.6*  HGB 10.9* 12.2*  HCT 31.4* 34.3*  PLT 127* 152  NA 138 137  K 3.7 4.3  CL 102 101  CO2 31 30  BUN 12 18  CREATININE 0.95 0.89  GLUCOSE 116* 117*  GLUCAP -- --  INR -- --  CALCIUM 8.6 --    Examination: ABD soft Neurovascular intact Sensation intact distally Intact pulses distally Dorsiflexion/Plantar flexion intact Incision: scant drainage}  Assessment:   2 Days Post-Op Procedure(s) (LRB): TOTAL KNEE ARTHROPLASTY (Right) ADDITIONAL DIAGNOSIS:  Acute Blood Loss Anemia, Hypertension, Cardiac Arrythmia right bundle brach block and Sleep Apnea  Plan: PT/OT WBAT, CPM 5/hrs day until ROM 0-90 degrees, then D/C CPM DVT Prophylaxis:  Lovenox\Coumadin bridge target INR 1.5-2.0 DISCHARGE PLAN: Skilled Nursing Facility/Rehab DISCHARGE NEEDS: HHPT, CPM, Walker and 3-in-1 comode seat Bowel management today     Zanayah Shadowens  J 08/27/2011, 11:19 AM

## 2011-08-28 LAB — BASIC METABOLIC PANEL
Calcium: 9 mg/dL (ref 8.4–10.5)
GFR calc Af Amer: >90 mL/min (ref >90)
GFR calc non Af Amer: 87 mL/min — ABNORMAL LOW (ref >90)
Potassium: 4.1 mEq/L (ref 3.5–5.1)
Sodium: 138 mEq/L (ref 135–145)

## 2011-08-28 LAB — CBC
MCHC: 35.2 g/dL (ref 30.0–36.0)
Platelets: 160 10*3/uL (ref 150–400)
RDW: 12.7 % (ref 11.5–15.5)

## 2011-08-28 MED ORDER — OXYCODONE-ACETAMINOPHEN 5-325 MG PO TABS: ORAL_TABLET | ORAL | Status: DC

## 2011-08-28 MED ORDER — BISACODYL 5 MG PO TBEC: 10.0000 mg | DELAYED_RELEASE_TABLET | Freq: Every day | ORAL | Status: AC | PRN

## 2011-08-28 MED ORDER — BISACODYL 10 MG RE SUPP
10.0000 mg | Freq: Once | RECTAL | Status: AC
  Administered 2011-08-28: 10 mg via RECTAL
  Filled 2011-08-28: qty 1

## 2011-08-28 MED ORDER — DSS 100 MG PO CAPS: 100.0000 mg | ORAL_CAPSULE | Freq: Two times a day (BID) | ORAL | Status: AC

## 2011-08-28 MED ORDER — ENOXAPARIN SODIUM 30 MG/0.3ML ~~LOC~~ SOLN: 40.0000 mg | SUBCUTANEOUS | Status: DC

## 2011-08-28 NOTE — Progress Notes (Signed)
Physical Therapy Treatment Patient Details Name: Benjamin Adkins MRN: [id number] DOB: 24-Apr-1941 Today's Date: 08/28/2011  PT Assessment/Plan  PT - Assessment/Plan Comments on Treatment Session: Patient with con't progress towards all goals and is started to ambulate with daughter in hallways. PT Plan: Discharge plan remains appropriate PT Frequency: 7X/week Follow Up Recommendations: Skilled nursing facility Equipment Recommended: Defer to next venue PT Goals  Acute Rehab PT Goals PT Goal: Supine/Side to Sit - Progress: Progressing toward goal PT Goal: Sit to Stand - Progress: Progressing toward goal PT Goal: Ambulate - Progress: Progressing toward goal PT Goal: Up/Down Stairs - Progress: Progressing toward goal PT Goal: Perform Home Exercise Program - Progress: Met  PT Treatment Precautions/Restrictions  Precautions Precautions: Knee Required Braces or Orthoses: Yes Knee Immobilizer: Discontinue post op day 2 Restrictions Weight Bearing Restrictions: No RLE Weight Bearing: Weight bearing as tolerated Mobility (including Balance) Bed Mobility Bed Mobility:  (patient received sitting up in chair) Transfers Sit to Stand: 5: Supervision Sit to Stand Details (indicate cue type and reason): increased time Stand to Sit: 5: Supervision Stand to Sit Details: increased time, patient able to tolerate increased R knee bending Ambulation/Gait Ambulation/Gait: Yes Ambulation/Gait Assistance: 5: Supervision Ambulation Distance (Feet): 150 Feet Assistive device: Rolling walker Gait Pattern: Step-to pattern;Decreased step length - right;Decreased stance time - right Gait velocity: decreased cadence Stairs: No Wheelchair Mobility Wheelchair Mobility: No  Posture/Postural Control Posture/Postural Control: No significant limitations Exercise  Total Joint Exercises Ankle Circles/Pumps: AROM;Both;10 reps;Seated Quad Sets: AROM;Right;10 reps;Seated (with LEs elevated) Short Arc Quad:  AROM;Right;10 reps;Seated Heel Slides: AROM;AAROM;Right;10 reps;Seated Pain:  7/10, RN provided pain medication  End of Session PT - End of Session Equipment Utilized During Treatment: Gait belt Activity Tolerance: Patient tolerated treatment well Patient left: in chair;with call bell in reach;with family/visitor present General Behavior During Session: [facility] for tasks performed Cognition: [facility] for tasks performed  Marcene Brawn 08/28/2011, 12:20 PM  Lewis Shock, PT, DPT Pager #: [phone] Office #: [phone]

## 2011-08-28 NOTE — Discharge Summary (Signed)
Physician Discharge Summary  Patient ID: Benjamin Adkins MRN: [id number] DOB/AGE: 12/02/1940 71 y.o.  Admit date: 08/25/2011 Discharge date: 08/28/2011  Admission Diagnoses: Past Medical History  Diagnosis Date  . Hyperlipidemia     nmr lipoprofile 2004: LDL 154 (1653/711), HDL 42, TG 80.NMR  LDL =,125, but PMH  of MI LDL goal=,70  . Unspecified essential hypertension   . Coronary atherosclerosis of unspecified type of vessel, native or graft   . Personal history of colonic polyps   . Osteoarthrosis, unspecified whether generalized or localized, unspecified site   . Elevated prostate specific antigen (PSA)   . Other cataract   . Coronary atherosclerosis of artery bypass graft   . Esophageal reflux   . Sleep apnea     uses CPAP  . Old myocardial infarction     2006  . Shortness of breath     DOE   Discharge Diagnoses:  Principal Problem:  *Arthritis of right knee Active Problems:  HYPERTENSION, ESSENTIAL NOS  CAD  RBBB  GERD  SLEEP APNEA, CHRONIC  Postoperative anemia due to acute blood loss   Discharged Condition: good  Hospital Course: 08/25/11   Right femoral nerve block.  Right total knee.  CPM in recovery.  CPAP started.  08/26/11  Up with physical therapy.  Metabolically stable.  Hgb 11.  Needing IV pain meds.  CPAP at night.  Difficulty tolerating CPAP due to mask ill fitting.  08/26/10  Thrombocytopenia.  Decreased Lovenox to once a day.  Right knee more swollen today.  Ambulating with min assist.    Consults: None  Significant Diagnostic Studies:  Results for orders placed during the hospital encounter of 08/25/11 (from the past 72 hour(s))  CBC     Status: Abnormal   Collection Time   08/26/11  5:30 AM      Component Value Range Comment   WBC 13.6 (*) 4.0 - 10.5 (K/uL)    RBC 3.86 (*) 4.22 - 5.81 (MIL/uL)    Hemoglobin 12.2 (*) 13.0 - 17.0 (g/dL)    HCT 40.9 (*) 81.1 - 52.0 (%)    MCV 88.9  78.0 - 100.0 (fL)    MCH 31.6  26.0 - 34.0 (pg)    MCHC 35.6  30.0 - 36.0  (g/dL)    RDW 91.4  78.2 - 95.6 (%)    Platelets 152  150 - 400 (K/uL)   BASIC METABOLIC PANEL     Status: Abnormal   Collection Time   08/26/11  5:30 AM      Component Value Range Comment   Sodium 137  135 - 145 (mEq/L)    Potassium 4.3  3.5 - 5.1 (mEq/L)    Chloride 101  96 - 112 (mEq/L)    CO2 30  19 - 32 (mEq/L)    Glucose, Bld 117 (*) 70 - 99 (mg/dL)    BUN 18  6 - 23 (mg/dL)    Creatinine, Ser 2.13  0.50 - 1.35 (mg/dL)    Calcium 8.6  8.4 - 10.5 (mg/dL)    GFR calc non Af Amer 85 (*) >90 (mL/min)    GFR calc Af Amer >90  >90 (mL/min)   CBC     Status: Abnormal   Collection Time   08/27/11  5:15 AM      Component Value Range Comment   WBC 9.7  4.0 - 10.5 (K/uL)    RBC 3.45 (*) 4.22 - 5.81 (MIL/uL)    Hemoglobin 10.9 (*) 13.0 - 17.0 (  g/dL)    HCT 16.1 (*) 09.6 - 52.0 (%)    MCV 91.0  78.0 - 100.0 (fL)    MCH 31.6  26.0 - 34.0 (pg)    MCHC 34.7  30.0 - 36.0 (g/dL)    RDW 04.5  40.9 - 81.1 (%)    Platelets 127 (*) 150 - 400 (K/uL)   BASIC METABOLIC PANEL     Status: Abnormal   Collection Time   08/27/11  5:15 AM      Component Value Range Comment   Sodium 138  135 - 145 (mEq/L)    Potassium 3.7  3.5 - 5.1 (mEq/L)    Chloride 102  96 - 112 (mEq/L)    CO2 31  19 - 32 (mEq/L)    Glucose, Bld 116 (*) 70 - 99 (mg/dL)    BUN 12  6 - 23 (mg/dL)    Creatinine, Ser 9.14  0.50 - 1.35 (mg/dL)    Calcium 8.6  8.4 - 10.5 (mg/dL)    GFR calc non Af Amer 82 (*) >90 (mL/min)    GFR calc Af Amer >90  >90 (mL/min)     Treatments: IV hydration, antibiotics: Ancef, analgesia: Dilaudid and percocet, cardiac meds: metoprolol, anticoagulation: LMW heparin, respiratory therapy: O2 and CPAP, therapies: PT, RN and SW and surgery: right total knee  Discharge Exam: Blood pressure 132/82, pulse 88, temperature 98.7 F (37.1 C), temperature source Oral, resp. rate 16, SpO2 95.00%. General appearance: alert, cooperative and appears stated age Pulses: 2+ and symmetric Incision/Wound:right knee  moderate swelling mild redness no drainage  Disposition: Stable  Discharge Orders    Future Orders Please Complete By Expires   Diet - low sodium heart healthy      Call MD / Call 911      Comments:   If you experience chest pain or shortness of breath, CALL 911 and be transported to the hospital emergency room.  If you develope a fever above 101 F, pus (white drainage) or increased drainage or redness at the wound, or calf pain, call your surgeon's office.   Constipation Prevention      Comments:   Drink plenty of fluids.  Prune juice may be helpful.  You may use a stool softener, such as Colace (over the counter) 100 mg twice a day.  Use MiraLax (over the counter) for constipation as needed.   Increase activity slowly as tolerated      Weight Bearing as taught in Physical Therapy      Comments:   Use a walker or crutches as instructed.   CPM      Comments:   Continuous passive motion machine (CPM):      Use the CPM from 0 to 90 for 6 hours per day.       You may break it up into 2 or 3 sessions per day.      Use CPM for 2 weeks or until you are told to stop.   TED hose      Comments:   Use stockings (TED hose) for 2 weeks on both leg(s).  You may remove them at night for sleeping.   Change dressing      Comments:   Change the dressing daily with sterile 4 x 4 inch gauze dressing and apply TED hose.  You may clean the incision with alcohol prior to redressing.   Do not put a pillow under the knee. Place it under the heel.  Comments:   Place yellow foam block, yellow side up under heel at all times except when in CPM or when walking.  DO NOT modify, tear, cut, or change in any way the yellow foam block.     Medication List  As of 08/28/2011  7:17 AM   STOP taking these medications         aspirin 325 MG tablet      glucosamine-chondroitin 500-400 MG tablet         TAKE these medications         atorvastatin 40 MG tablet   Commonly known as: LIPITOR      bisacodyl 5  MG EC tablet   Commonly known as: DULCOLAX   Take 2 tablets (10 mg total) by mouth daily as needed for constipation.      celecoxib 100 MG capsule   Commonly known as: CELEBREX   Take 100 mg by mouth 2 (two) times daily as needed. Arthritis pain      DSS 100 MG Caps   Take 100 mg by mouth 2 (two) times daily.      enoxaparin 30 MG/0.3ML Soln   Commonly known as: LOVENOX   Inject 0.4 mLs (40 mg total) into the skin daily.      metoprolol succinate 25 MG 24 hr tablet   Commonly known as: TOPROL-XL      multivitamin capsule   Take 1 capsule by mouth daily.      nitroGLYCERIN 0.4 MG SL tablet   Commonly known as: NITROSTAT   Place 0.4 mg under the tongue every 5 (five) minutes as needed. Chest pain      omeprazole 20 MG capsule   Commonly known as: PRILOSEC   Take 20 mg by mouth daily.      oxyCODONE-acetaminophen 5-325 MG per tablet   Commonly known as: PERCOCET   1-2 tab every 4-6 hrs prn pain           Follow-up Information    Follow up with Nilda Simmer, MD on 09/08/2011. (2:30 pm)    Contact information:   [facility] [address], [address] [place] [address] [phone]          Signed: Pascal Lux 08/28/2011, 7:17 AM

## 2011-08-28 NOTE — Plan of Care (Signed)
Problem: Discharge Progression Outcomes Goal: Barriers To Progression Addressed/Resolved Outcome: Completed/Met Date Met:  08/28/11 BM this am after suppository

## 2011-08-28 NOTE — Progress Notes (Addendum)
D/C instructions were reviewed with pt and family member. Copy of instructions given to pt. Scripts and other information was placed in packet for pt to take to [facility], SNF that pt is being discharged to. Pt and family member verbalized understanding of all instructions. Education provided.  SNF will provide wound care to incision, and administer meds to pt, as well as therapy. Pt d/c'd via wheelchair with belongings and family, escorted by unit NT.

## 2011-08-28 NOTE — Plan of Care (Signed)
Problem: Discharge Progression Outcomes Goal: Incision without S/S infection Outcome: Completed/Met Date Met:  08/28/11 Incision seen by PA this am, dressing was not removed again by RN.

## 2011-08-28 NOTE — Progress Notes (Signed)
Patient for d/c today to SNF bed at [facility]- patient agreeable to plans and will plan transport by his daughter after lunch today. Patient is eager to continue his rehabilitation at SNF-  Reece Levy, MSW, [employer] [phone]

## 2011-09-08 ENCOUNTER — Telehealth: Payer: Self-pay | Admitting: *Deleted

## 2011-09-08 NOTE — Telephone Encounter (Signed)
I called, spoke with pt.    Pt states he is currently at [facility] for rehab recovering from knee surgery.  He states he is using the cpap every night.  However, he is having problems with the humidifier can't sleep without the cpap.  Pt was very upset because he called [facility] approx 1 wk ago and "we're still fooling around with filling out forms."  He states "I thought this had been taken care of."  I explained to pt that [facility] had sent the forms to Dr. Quintella Reichert bc he hasn't been seen by Pulmonary in a while, and per Virgil Law, pts need to see providers at least yearly in order for orders/forms to be signed.  I apologized for this inconvience to him.  Our first available appt with a sleep dr is March 6 with [facility]  I advised pt I will speak with director and will call him back regarding this.  He verbalized understanding and requesting I call him back ASAP to get this figured out.

## 2011-09-08 NOTE — Telephone Encounter (Signed)
Spoke with Dr. Delford Field regarding this -- he will sign order to have machine fixed BUT pt will need to schedule an appt with Sleep dr and keep this appt.    I called pt back.  I advised we will work on trying to get his machine fixed but he will need to make and appt with a sleep dr and keep this appt for any other forms to be filled out or signed.  He verbalized understanding of this and requesting an appt in March d/t recent surgery.  A sleep consult was scheduled with Dr. Shelle Iron on March 6 at 11:30 am.  I advised pt to arrive at 11:15 am.  Office address was given.  Pt verbalized understanding and aware I will call sleep med.  I called sleep med in [place].  Received message stating they are open from 9am - 12 pm and 1pm - 4 pm.  Will call back.

## 2011-09-08 NOTE — Telephone Encounter (Signed)
I called [facility] and spoke with Rosezella Florida.    Per Misty Stanley, the pt had a friend bring in his CPAP.  She tested it, and it is not broken.  She states the humidifier and the machine are working fine but pt wants a new machine.  She will fax this order to [employer] but states she will not be able to set pt up with a new machine until he has actually seen a physician regarding this d/t medicare guidelines of needing OV documentation regarding cpap and pt's cpap is not broken.   Advised I will speak with Dr. Delford Field regarding this.  If he still wants to proceed with this, will have him sign order and will fax back.    Dr. Delford Field, Misty Stanley states you can sign the order for a new cpap but she still will not be able to set pt up for a new cpap until he is actually seen by a physician regarding this.  Given the above, do you want to sign the order?  I placed it in your look at folder.

## 2011-09-08 NOTE — Telephone Encounter (Signed)
Message copied by Valentino Hue on Mon Sep 08, 2011 12:15 PM ------      Message from: Shan Levans E      Created: Mon Sep 08, 2011  6:02 AM       Benjamin Adkins.  We will get him back in ...            Thanks             Lance Muss      Please get this pt an appt with one of the Sleep MDs for Sleep medicine re evaluation            pw            ----- Message -----         From: Pecola Lawless, MD         Sent: 09/07/2011  10:21 AM           To: Shan Levans, MD            I'll FAX form for CPAP equipment; last evaluated by you in 2006 ? I  Have never seen him for this (Dx not in Epic). You may want him to F/U with you or one of your peers. Thanks, [employer]

## 2011-09-08 NOTE — Telephone Encounter (Signed)
I will sign the order to get the pt a new machine.  If [facility] wont do this, find another DME company that will

## 2011-09-11 ENCOUNTER — Telehealth: Payer: Self-pay | Admitting: Cardiovascular Disease

## 2011-09-11 NOTE — Telephone Encounter (Signed)
Per Tammy D., pt will need to come in for this.  We do not have a documented visit to supply the DME companies with stating the needed medicare information in order for them to provide the equipment. This is a medicare requirement.  I called, spoke with pt.  I explained to him that unfortunately, even if we do sign the order, DME will not be able to supply the machine because we they will not have the needed documentation that medicare requires.  He verbalized understanding of this and has already spoken with [facility] about this.  [facility] had a cancellation tomorrow at 2:30 -- we scheduled pt for this time.  I did advise him to arrive at 2:15.  He verbalized understanding.  Nothing further needed at this time.

## 2011-09-11 NOTE — Telephone Encounter (Signed)
New Problem   Patient would like to know if the 11/2011 is necessary, since he had stress test 1/24 & Surgical clearance 1/23 w/ Herma Carson. Patient can be reached at hm# [phone] please advise.

## 2011-09-11 NOTE — Telephone Encounter (Signed)
PT LAST  VISIT [facility] IN 07-2011   PER OFFICE NOTE DOES NOT NEED TO  COME BACK  FOR YEAR TO SEE DR Eden Emms . PT AWARE AND IN MEANTIME IF HAS PROBLEMS WILL CALL FOR EARLIER APPT./CY

## 2011-09-12 ENCOUNTER — Encounter: Payer: Self-pay | Admitting: Pulmonary Disease

## 2011-09-12 ENCOUNTER — Ambulatory Visit (INDEPENDENT_AMBULATORY_CARE_PROVIDER_SITE_OTHER): Payer: Medicare Other | Admitting: Pulmonary Disease

## 2011-09-12 VITALS — BP 138/84 | HR 75 | Temp 98.3°F | Ht 68.0 in | Wt 193.4 lb

## 2011-09-12 DIAGNOSIS — G4730 Sleep apnea, unspecified: Secondary | ICD-10-CM

## 2011-09-12 NOTE — Progress Notes (Signed)
  Subjective:    Patient ID: Benjamin Adkins, male    DOB: [birth year], 71 y.o.   MRN: [id number]  HPI The patient is a 71 year old male who I've been asked to see for management of obstructive sleep apnea.  The patient was diagnosed with severe sleep apnea in 2006, with an AHI of 66 events per hour.  He was started on CPAP, and has been on the device every since.  He saw significant improvement in his symptoms, but most recently he has had trouble with his machine and the humidifier has stopped working.  He currently wears a nasal CPAP mask, and it is new.  The patient feels that he sleeps well at night, and is rested in the mornings upon arising.  He is satisfied with his daytime alertness.  His Epworth sleepiness score today is normal at 2.  Sleep Questionnaire: What time do you typically go to bed?( Between what hours) 10:00 pm How long does it take you to fall asleep? 15 mins How many times during the night do you wake up? 2 What time do you get out of bed to start your day? 0600 Do you drive or operate heavy machinery in your occupation? No How much has your weight changed (up or down) over the past two years? (In pounds) 5 lb (2.268 kg) Have you ever had a sleep study before? Yes If yes, location of study? [facility] If yes, date of study? 2006 Do you currently use CPAP? Yes If so, what pressure? Do you wear oxygen at any time? No    Review of Systems  Constitutional: Negative for fever and unexpected weight change.  HENT: Negative for ear pain, nosebleeds, congestion, sore throat, rhinorrhea, sneezing, trouble swallowing, dental problem, postnasal drip and sinus pressure.   Eyes: Negative for redness and itching.  Respiratory: Positive for shortness of breath. Negative for cough, chest tightness and wheezing.   Cardiovascular: Negative for palpitations and leg swelling.  Gastrointestinal: Negative for nausea and vomiting.  Genitourinary: Negative for dysuria.  Musculoskeletal: Positive for joint  swelling.  Skin: Negative for rash.  Neurological: Negative for headaches.  Hematological: Does not bruise/bleed easily.  Psychiatric/Behavioral: Negative for dysphoric mood. The patient is not nervous/anxious.        Objective:   Physical Exam Constitutional:  Well developed, no acute distress  HENT:  Nares patent without discharge  Oropharynx without exudate, palate and uvula are moderately elongated.   Eyes:  Perrla, eomi, no scleral icterus  Neck:  No JVD, no TMG  Cardiovascular:  Normal rate, regular rhythm, no rubs or gallops.  No murmurs        Intact distal pulses  Pulmonary :  Normal breath sounds, no stridor or respiratory distress   No rales, rhonchi, or wheezing  Abdominal:  Soft, nondistended, bowel sounds present.  No tenderness noted.   Musculoskeletal:  mild lower extremity edema noted.  Lymph Nodes:  No cervical lymphadenopathy noted  Skin:  No cyanosis noted  Neurologic:  Alert, appropriate, moves all 4 extremities without obvious deficit.         Assessment & Plan:

## 2011-09-12 NOTE — Patient Instructions (Signed)
Will get setup on new machine at same pressure. If doing well, will see you back in one year.  Please call if having issues.

## 2011-09-12 NOTE — Assessment & Plan Note (Signed)
The patient has a history of severe sleep apnea, and has been treated successfully with CPAP since 2006.  He is overdue for a new machine, and we'll send an order to his medical equipment company for this.  The patient is satisfied with his current sleep and daytime alertness on his current settings.  I have asked him to keep up with his mask changes as well as supplies, and to followup with me in one year.

## 2011-09-24 ENCOUNTER — Institutional Professional Consult (permissible substitution): Payer: Medicare Other | Admitting: Pulmonary Disease

## 2011-10-26 ENCOUNTER — Other Ambulatory Visit: Payer: Self-pay | Admitting: Internal Medicine

## 2011-11-13 ENCOUNTER — Ambulatory Visit (INDEPENDENT_AMBULATORY_CARE_PROVIDER_SITE_OTHER): Payer: Medicare Other | Admitting: Internal Medicine

## 2011-11-13 ENCOUNTER — Encounter: Payer: Self-pay | Admitting: Internal Medicine

## 2011-11-13 VITALS — BP 122/84 | HR 66 | Temp 98.4°F | Wt 198.0 lb

## 2011-11-13 DIAGNOSIS — B351 Tinea unguium: Secondary | ICD-10-CM

## 2011-11-13 DIAGNOSIS — R7309 Other abnormal glucose: Secondary | ICD-10-CM

## 2011-11-13 DIAGNOSIS — IMO0002 Reserved for concepts with insufficient information to code with codable children: Secondary | ICD-10-CM

## 2011-11-13 MED ORDER — KETOCONAZOLE 2 % EX CREA: TOPICAL_CREAM | Freq: Two times a day (BID) | CUTANEOUS | Status: AC

## 2011-11-13 MED ORDER — CEPHALEXIN 500 MG PO CAPS: 500.0000 mg | ORAL_CAPSULE | Freq: Two times a day (BID) | ORAL | Status: AC

## 2011-11-13 NOTE — Progress Notes (Signed)
  Subjective:    Patient ID: Benjamin Adkins, male    DOB: 17-Apr-1941, 71 y.o.   MRN: [id number]  HPI Several months ago he noted an irregularity in the right great toenail which resolved on its own with nail growth. The last few days he stated some tenderness of that nail with some greenish discoloration.  He denies fever, chills, or sweats.  He had a total knee replacement 2/for/13. Those labs were reviewed. Serial nonfasting glucoses were mildly elevated; 116-133. He has no history of diabetes   Review of Systems He denies polyuria, polyphagia, or polydipsia.     Objective:   Physical Exam He is healthy and well-nourished in appearance  The operative scar of the right knee is well-healed.  Pedal pulses are intact without ischemic changes  There is lateral deviation of at the base of the right great toe  Light touch is normal over feet  Mild onycholysis is present with faintly green discoloration at the central base of the right great toenail.        Assessment & Plan:   #1 fungal nail infection  #2 hyperglycemia  Plan: See orders and recommendations

## 2011-11-13 NOTE — Patient Instructions (Signed)
After showering  use a hairdryer to blow the nails dry. Also do this after any activities which cause sweating. Change socks repeatedly through the day if the feet do sweat. Tennis shoes or other foot wear which appear somewhat moldy should be discarded.  Please try to go on My Chart within the next 24 hours to allow me to release the results directly to you.

## 2011-12-02 ENCOUNTER — Encounter: Payer: Self-pay | Admitting: Internal Medicine

## 2011-12-19 ENCOUNTER — Other Ambulatory Visit: Payer: Self-pay | Admitting: Cardiovascular Disease

## 2011-12-19 NOTE — Telephone Encounter (Signed)
..   Requested Prescriptions   Pending Prescriptions Disp Refills  . metoprolol succinate (TOPROL-XL) 25 MG 24 hr tablet [Pharmacy Med Name: METOPROLOL SUCC ER 25 MG TAB] 90 tablet 3    Sig: TAKE 1 TABLET BY MOUTH DAILY

## 2011-12-30 ENCOUNTER — Other Ambulatory Visit: Payer: Self-pay | Admitting: Internal Medicine

## 2011-12-30 NOTE — Telephone Encounter (Signed)
#  30 sent per previous note.

## 2011-12-30 NOTE — Telephone Encounter (Signed)
This agent has increased cardiac and GI risk if taken on a regular basis. It is for this reason it has a "black box" warning. It should be taken as infrequently as possible. # 30

## 2011-12-30 NOTE — Telephone Encounter (Signed)
Last OV 11-12-11,

## 2012-01-27 ENCOUNTER — Other Ambulatory Visit: Payer: Self-pay | Admitting: Internal Medicine

## 2012-03-20 ENCOUNTER — Other Ambulatory Visit: Payer: Self-pay | Admitting: Internal Medicine

## 2012-03-23 MED ORDER — CELECOXIB 100 MG PO CAPS: ORAL_CAPSULE | ORAL | Status: DC

## 2012-03-23 NOTE — Telephone Encounter (Signed)
RX sent in

## 2012-03-23 NOTE — Telephone Encounter (Signed)
Celebrex request [Last Rx 06.11.13 #30x0]/SLS Please advise.

## 2012-03-23 NOTE — Telephone Encounter (Signed)
Last OV 11-13-11 last refill 12-30-11 #30 no refills

## 2012-03-23 NOTE — Telephone Encounter (Signed)
#   30 , NR.This medication is " as needed" and should not be taken on a regular basis.  Regular use can increase risk of gastrointestinal bleeding or cardiovascular disease. Tramadol 50 mg one half to 1 every 8 hours as needed would be a safer alternative as it does not have the cardiovascular or gastrointestinal warnings. 

## 2012-03-23 NOTE — Telephone Encounter (Signed)
#   30 , NR.This medication is " as needed" and should not be taken on a regular basis.  Regular use can increase risk of gastrointestinal bleeding or cardiovascular disease. Tramadol 50 mg one half to 1 every 8 hours as needed would be a safer alternative as it does not have the cardiovascular or gastrointestinal warnings.

## 2012-03-23 NOTE — Addendum Note (Signed)
Addended by: Maurice Small on: 03/23/2012 05:20 PM   Modules accepted: Orders

## 2012-04-01 ENCOUNTER — Other Ambulatory Visit: Payer: Self-pay | Admitting: Internal Medicine

## 2012-04-01 NOTE — Telephone Encounter (Signed)
Caller: [name]/Patient; Patient Name: Benjamin Adkins; PCP: Marga Melnick; Best Callback Phone Number: [phone]; Reason for call: Other Right great toenail infection. Patient states he had one in April 2013 and Dr. Alwyn Ren gave him Nizoral Cream and an antibiotiic.  He noted that toe was starting to have the same appearance again at the nailbed . Right Great toe. Onset 2 weeks ago 03/19/12.  Denies any ingrown nail.  NO pain , no swelling, no drainage, "white around the cuticle area". Afebrile.  He requesting Antibiotic medication for the toe again to get ahead of the infections.  Emergent s/sx ruled out per Foot Non-injury protocol with exception to All other situations. Request for medication. Advised I would send message to the office per request.  Understanding expressed Reviewed instructions from April 25th, 2013 Epic visit with Dr. Alwyn Ren for Fungal nail infection . Rx for Nizoral and Cephelxin at last visit.

## 2012-04-01 NOTE — Telephone Encounter (Signed)
Dr.Hopper please advise if rx to be given or OV required

## 2012-04-01 NOTE — Telephone Encounter (Signed)
Scheduled appointment for tomorrow

## 2012-04-01 NOTE — Telephone Encounter (Signed)
Nizoral 15 g; apply twice a day and blow dry with hair drier Office visit would be needed for antibiotic prescription

## 2012-04-02 ENCOUNTER — Encounter: Payer: Self-pay | Admitting: Internal Medicine

## 2012-04-02 ENCOUNTER — Ambulatory Visit (INDEPENDENT_AMBULATORY_CARE_PROVIDER_SITE_OTHER): Payer: Medicare Other | Admitting: Internal Medicine

## 2012-04-02 VITALS — BP 124/78 | HR 67 | Temp 98.7°F | Wt 202.4 lb

## 2012-04-02 DIAGNOSIS — B351 Tinea unguium: Secondary | ICD-10-CM

## 2012-04-02 DIAGNOSIS — Z23 Encounter for immunization: Secondary | ICD-10-CM

## 2012-04-02 NOTE — Progress Notes (Signed)
  Subjective:    Patient ID: Benjamin Adkins, male    DOB: 1940-12-28, 72 y.o.   MRN: [id number]  HPI He has had recurrent fungal nail changes of the right great toenail for several weeks. He called to have the cephalexin refilled; this had been prescribed for paronychia of the nail. He still has some leftover Nizoral.  Because of these changes and elevated glucose of 133 in February of this year; an  A1c was checked 4/25. This was in the nondiabetic range of 5.7. One uncle did have diabetes    Review of Systems He has no associated inguinal area rash. There's been no associated fever, chills, purulent discharge from the nail.     Objective:   Physical Exam  He appears healthy and well-nourished in no distress  Pedal pulses are slightly decreased but there is no ischemic change of his feet.  Skin is healthy.  There is hallux deformity at the base of the right great toe  He has classic fungal changes of the right great toenail. There is dehiscence of nail distally, suggesting some loss of viability of the nail.          Assessment & Plan:  #1 fungal toenail changes right great toenail  #2 hyperglycemia with normal A1c  Plan: Focus on keeping the nail clean and dry was discussed. He'll use a hairdryer after showering. If he does not see a significant response; podiatry consult recommended. There is no evidence of paronychia and no need for an antibiotic at this time. Unfortunately he denies real will not help the subungual fungal infection

## 2012-04-02 NOTE — Addendum Note (Signed)
Addended by: Maurice Small on: 04/02/2012 01:56 PM   Modules accepted: Orders

## 2012-04-02 NOTE — Patient Instructions (Addendum)
After showering  use a hairdryer to blow the nails dry. Also do this after any activities which cause sweating. Change socks repeatedly through the day if the feet do sweat. Tennis shoes or other foot wear which appear somewhat moldy should be discarded. 

## 2012-04-05 ENCOUNTER — Ambulatory Visit: Payer: Medicare Other | Admitting: Internal Medicine

## 2012-04-13 ENCOUNTER — Other Ambulatory Visit: Payer: Self-pay | Admitting: Internal Medicine

## 2012-06-02 ENCOUNTER — Other Ambulatory Visit: Payer: Self-pay | Admitting: Internal Medicine

## 2012-06-02 NOTE — Telephone Encounter (Signed)
#   20;  4 pills one hour prior to dental procedures as subacute bacterial endocarditis prophylaxis

## 2012-06-02 NOTE — Telephone Encounter (Signed)
OV 04/02/12 don't see med on list or a recent Dx that warrants Rx refill for amoxicillin Plz advise     MW

## 2012-06-02 NOTE — Telephone Encounter (Signed)
Phoned in Rx. [facility] Pharmacist advised Rx will be available soon.   MW

## 2012-06-30 ENCOUNTER — Other Ambulatory Visit: Payer: Self-pay | Admitting: Internal Medicine

## 2012-08-03 ENCOUNTER — Other Ambulatory Visit: Payer: Self-pay | Admitting: Internal Medicine

## 2012-10-18 ENCOUNTER — Encounter: Payer: Self-pay | Admitting: Internal Medicine

## 2012-10-18 NOTE — Telephone Encounter (Signed)
Last appointment is 04/02/12, Hopp please advise if patient to have OV or give recommendations

## 2012-10-21 ENCOUNTER — Encounter: Payer: Self-pay | Admitting: Internal Medicine

## 2012-10-21 NOTE — Telephone Encounter (Signed)
NOT Neosporin unless pus & fever present; use Lotrimin AF twice a day

## 2012-12-02 ENCOUNTER — Other Ambulatory Visit: Payer: Self-pay | Admitting: Internal Medicine

## 2012-12-15 ENCOUNTER — Other Ambulatory Visit: Payer: Self-pay | Admitting: *Deleted

## 2012-12-15 MED ORDER — METOPROLOL SUCCINATE ER 25 MG PO TB24: 25.0000 mg | ORAL_TABLET | Freq: Every day | ORAL | Status: DC

## 2013-02-06 ENCOUNTER — Other Ambulatory Visit: Payer: Self-pay | Admitting: Internal Medicine

## 2013-02-23 ENCOUNTER — Other Ambulatory Visit: Payer: Self-pay

## 2013-02-28 ENCOUNTER — Encounter: Payer: Self-pay | Admitting: Internal Medicine

## 2013-03-25 ENCOUNTER — Other Ambulatory Visit: Payer: Self-pay | Admitting: Internal Medicine

## 2013-03-29 NOTE — Telephone Encounter (Signed)
Celebrex refill request.  Last OV 04-02-12 Med filled on 12-02-2012 #60 with 0  No upcoming appts.

## 2013-03-29 NOTE — Telephone Encounter (Signed)
OK # 30 F/U needed prior to additional refills ; last seen 9/13

## 2013-03-30 ENCOUNTER — Other Ambulatory Visit: Payer: Self-pay | Admitting: General Practice

## 2013-03-30 MED ORDER — CELECOXIB 100 MG PO CAPS: ORAL_CAPSULE | ORAL | Status: DC

## 2013-03-30 NOTE — Telephone Encounter (Signed)
Rx already sent to the pharmacy.//AB/CMA

## 2013-04-20 ENCOUNTER — Encounter: Payer: Self-pay | Admitting: Internal Medicine

## 2013-04-20 ENCOUNTER — Ambulatory Visit (INDEPENDENT_AMBULATORY_CARE_PROVIDER_SITE_OTHER): Payer: Medicare (Managed Care) | Admitting: Internal Medicine

## 2013-04-20 VITALS — BP 113/63 | HR 64 | Temp 98.9°F | Resp 12 | Wt 202.2 lb

## 2013-04-20 DIAGNOSIS — I10 Essential (primary) hypertension: Secondary | ICD-10-CM

## 2013-04-20 DIAGNOSIS — D62 Acute posthemorrhagic anemia: Secondary | ICD-10-CM

## 2013-04-20 DIAGNOSIS — E785 Hyperlipidemia, unspecified: Secondary | ICD-10-CM

## 2013-04-20 DIAGNOSIS — M129 Arthropathy, unspecified: Secondary | ICD-10-CM

## 2013-04-20 DIAGNOSIS — R7309 Other abnormal glucose: Secondary | ICD-10-CM

## 2013-04-20 MED ORDER — OMEPRAZOLE 20 MG PO CPDR: DELAYED_RELEASE_CAPSULE | ORAL | Status: DC

## 2013-04-20 MED ORDER — CELECOXIB 100 MG PO CAPS: ORAL_CAPSULE | ORAL | Status: DC

## 2013-04-20 MED ORDER — TRAMADOL HCL 50 MG PO TABS: 50.0000 mg | ORAL_TABLET | Freq: Three times a day (TID) | ORAL | Status: DC | PRN

## 2013-04-20 NOTE — Assessment & Plan Note (Signed)
CBC & dif 

## 2013-04-20 NOTE — Progress Notes (Signed)
Subjective:    Patient ID: Benjamin Adkins, male    DOB: Jul 25, 1940, 72 y.o.   MRN: [id number]  HPI  He is here for refills of medication,specifically his PPI as well as Celebrex.  The Celebrex is an as needed medication rather than daily. He is taking 2-3 times per week when he plays golf.  He has arthralgias involving low back as well as the PIP and DIP joints of the hands. He denies significant morning stiffness or pain.  He is on a beta blocker; he monitors his blood pressure at physician and dental visits. Blood pressure has been well controlled. He's had no adverse effects from the medications  He has had an elevated fasting glucose in the past, up to 133. His last A1c was nondiabetic at 5.7%.  He is on high-dose statin; there are no active liver function test or lipids on record.  He is physically active employee stationary bike and walking. In addition to playing golf he does work in the yard. He has noted that certain yard work will exacerbate his low back symptoms requiring the use of Celebrex.    Review of Systems  He is on a PPI daily. He denies significant hoarseness, dysphagia, abdominal pain, unexplained weight loss, melena, rectal bleeding. He was found to be anemic in February 2013 which was in the setting of his knee surgery.  He specifically denies any significant headaches; chest pain; palpitations; claudication; or paroxysmal nocturnal dyspnea. He also denies exertional dyspnea. He has no lightheadedness or presyncope. He has no edema.  Polyuria, polyphagia, polydipsia are not present.  There've been no symptoms of weakness, numbness, tingling in his extremities.  He has no nonhealing skin lesions.  Visual symptoms of blurring, double vision, or loss of vision or negative  He is compliant with his statin and denies myalgias.    Objective:   Physical Exam Gen.:  well-nourished in appearance. Alert, appropriate and cooperative throughout exam. Head:  Normocephalic without obvious abnormalities;  pattern alopecia  Eyes: No corneal or conjunctival inflammation noted. Pupils unequal due to service Mouth: Oral mucosa and oropharynx reveal no lesions or exudates. Teeth in good repair. Neck: No deformities, masses, or tenderness noted. Range of motion good. Lungs: Normal respiratory effort; chest expands symmetrically. Lungs are clear to auscultation without rales, wheezes, or increased work of breathing. Heart: Normal rate and rhythm. Normal S1 and S2. No gallop, click, or rub. S4 w/o murmur. Abdomen: Bowel sounds normal; abdomen soft and nontender. No masses or organomegaly. Ventral hernia noted.                                  Musculoskeletal/extremities:  Accentuated curvature of upper thoracic  Spine. No clubbing, cyanosis, or edema noted.Tone & strength  Normal. Joints  reveal mixed PIP& DIP changes. Nail health good. Able to lie down & sit up w/o help. Negative SLR bilaterally. ROM decreased @ knees Vascular: Carotid, radial artery, dorsalis pedis and  posterior tibial pulses are full and equal. No bruits present. Neurologic: Alert and oriented x3. Deep tendon reflexes symmetrical and normal.         Skin: Intact without suspicious lesions or rashes. Lymph: No cervical, axillary lymphadenopathy present. Psych: Mood and affect are normal. Normally interactive  Assessment & Plan:  See Current Assessment & Plan in Problem List under specific Diagnosis

## 2013-04-20 NOTE — Patient Instructions (Addendum)
Reflux of gastric acid may be asymptomatic as this may occur mainly during sleep.The triggers for reflux  include stress; the "aspirin family" ; alcohol; peppermint; and caffeine (coffee, tea, cola, and chocolate). The aspirin family would include aspirin and the nonsteroidal agents such as ibuprofen &  Naproxen. Tylenol would not cause reflux. If having symptoms ; food & drink should be avoided for @ least 2 hours before going to bed.  

## 2013-04-20 NOTE — Assessment & Plan Note (Signed)
Fasting lipids, LFTs, CK

## 2013-04-20 NOTE — Assessment & Plan Note (Signed)
BMET 

## 2013-04-20 NOTE — Assessment & Plan Note (Addendum)
RA,  sed rate

## 2013-04-25 ENCOUNTER — Encounter: Payer: Self-pay | Admitting: Lab

## 2013-04-26 ENCOUNTER — Other Ambulatory Visit (INDEPENDENT_AMBULATORY_CARE_PROVIDER_SITE_OTHER): Payer: Medicare (Managed Care)

## 2013-04-26 DIAGNOSIS — M129 Arthropathy, unspecified: Secondary | ICD-10-CM

## 2013-04-26 DIAGNOSIS — E785 Hyperlipidemia, unspecified: Secondary | ICD-10-CM

## 2013-04-26 LAB — HEPATIC FUNCTION PANEL
ALT: 23 U/L (ref 0–53)
Alkaline Phosphatase: 65 U/L (ref 39–117)
Bilirubin, Direct: 0 mg/dL (ref 0.0–0.3)
Total Bilirubin: 0.8 mg/dL (ref 0.3–1.2)
Total Protein: 7 g/dL (ref 6.0–8.3)

## 2013-04-26 LAB — LIPID PANEL
HDL: 50.6 mg/dL (ref >39.00)
LDL Cholesterol: 104 mg/dL — ABNORMAL HIGH (ref 0–99)
Total CHOL/HDL Ratio: 3
Triglycerides: 114 mg/dL (ref 0.0–149.0)

## 2013-04-26 LAB — TSH: TSH: 0.75 u[IU]/mL (ref 0.35–5.50)

## 2013-04-26 LAB — SEDIMENTATION RATE: Sed Rate: 9 mm/hr (ref 0–22)

## 2013-04-26 LAB — CK: Total CK: 164 U/L (ref 7–232)

## 2013-04-28 ENCOUNTER — Ambulatory Visit (INDEPENDENT_AMBULATORY_CARE_PROVIDER_SITE_OTHER): Payer: Medicare (Managed Care)

## 2013-04-28 DIAGNOSIS — Z23 Encounter for immunization: Secondary | ICD-10-CM

## 2013-06-05 ENCOUNTER — Other Ambulatory Visit: Payer: Self-pay | Admitting: Internal Medicine

## 2013-06-06 NOTE — Telephone Encounter (Signed)
Atorvastatin refilled per protocol 

## 2013-07-13 IMAGING — CR DG CHEST 2V
2 series · 2 of 2 positions shown · non-contrast
Comparison: 09/07/2004

CLINICAL DATA: Preoperative radiograph for total knee replacement.

CHEST - 2 VIEW

[view not recorded (1 of 2)]
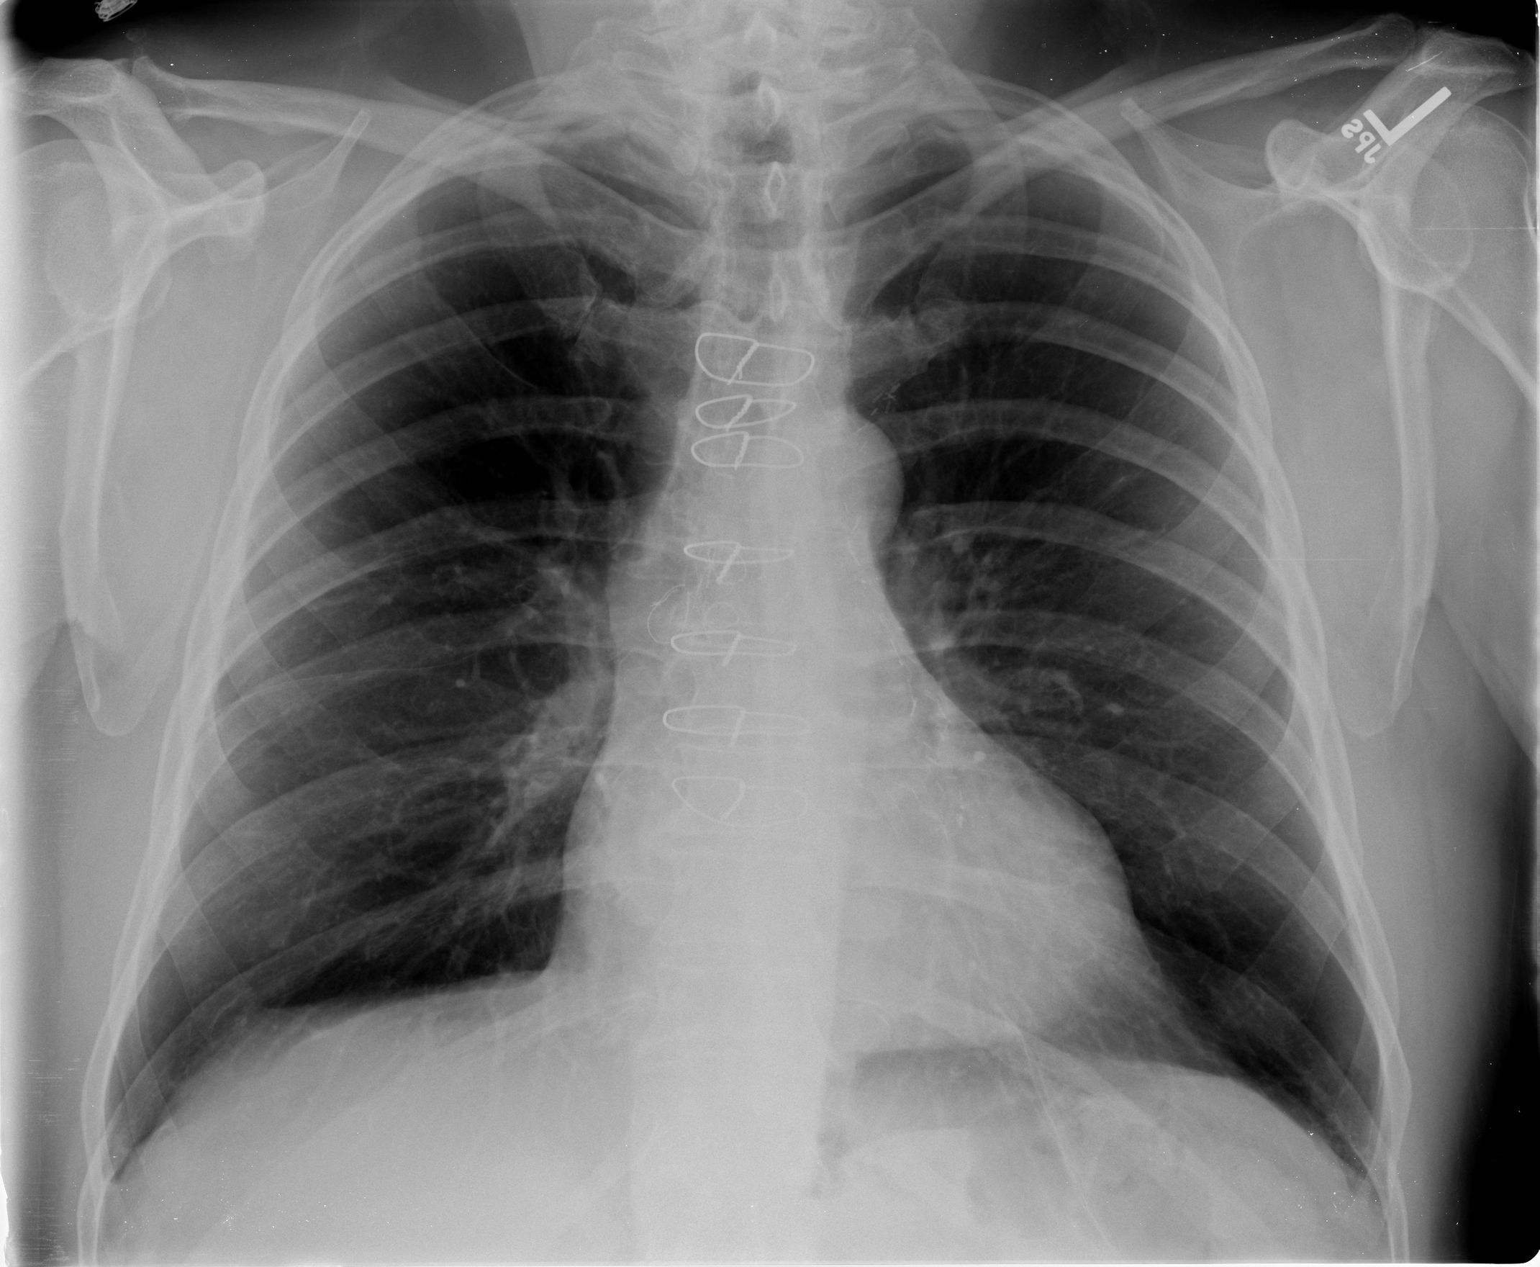

[view not recorded (2 of 2)]
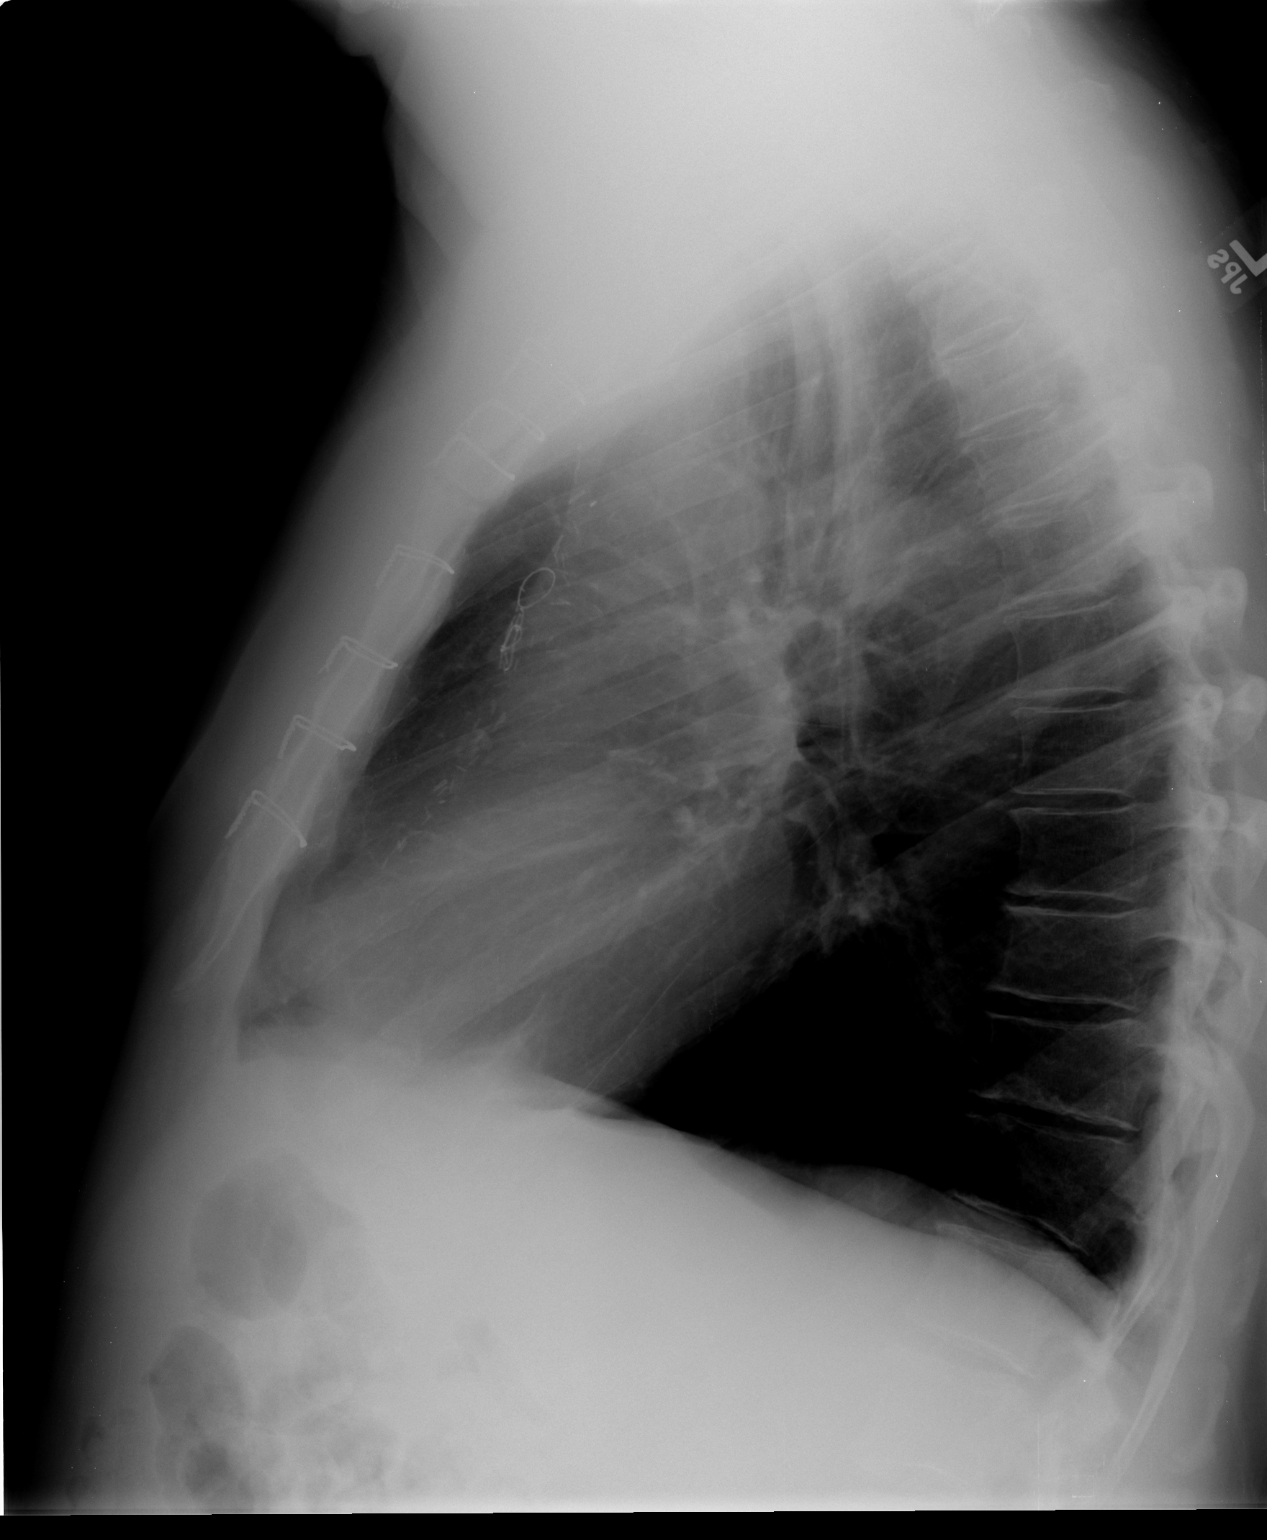

[2 of 2 positions shown; findings below may reference images not displayed]

FINDINGS: Status post median sternotomy and CABG.  Heart size upper
normal limits to mildly enlarged. Increased AP diameter and
flattened hemidiaphragms without focal consolidation.  No pleural
effusion or pneumothorax.  No acute osseous abnormality.
IMPRESSION: Status post median sternotomy and CABG.  Heart size upper normal
limits to mildly enlarged.

Mild hyperinflation without focal consolidation.

## 2013-08-03 ENCOUNTER — Ambulatory Visit (INDEPENDENT_AMBULATORY_CARE_PROVIDER_SITE_OTHER): Payer: Medicare Other | Admitting: Internal Medicine

## 2013-08-03 ENCOUNTER — Encounter: Payer: Self-pay | Admitting: Internal Medicine

## 2013-08-03 ENCOUNTER — Telehealth: Payer: Self-pay | Admitting: Internal Medicine

## 2013-08-03 VITALS — BP 143/86 | HR 72 | Temp 98.1°F | Wt 204.0 lb

## 2013-08-03 DIAGNOSIS — D72829 Elevated white blood cell count, unspecified: Secondary | ICD-10-CM

## 2013-08-03 DIAGNOSIS — K625 Hemorrhage of anus and rectum: Secondary | ICD-10-CM

## 2013-08-03 MED ORDER — HYDROCORTISONE ACETATE 25 MG RE SUPP: 25.0000 mg | Freq: Every day | RECTAL | Status: DC | PRN

## 2013-08-03 NOTE — Progress Notes (Signed)
Subjective:    Patient ID: Benjamin Adkins, male    DOB: [birth year], 73 y.o.   MRN: [id number]  HPI Acute visit Since the weekend has seen  a small amount of red blood per rectum associated with bowel movements x 2 times, it was few drops, was not mixed with the stools. He has a history of hemorrhoids but has not seen blood in the stools before. He did some heavy lifting during the weekend and took two tramadol and 1 Celebrex. Chart reviewed, had 2 colonoscopies in the past, see assessment and plan.  Past Medical History  Diagnosis Date  . Hyperlipidemia     nmr lipoprofile 2004: LDL 154 (1653/711), HDL 42, TG 80.NMR  LDL =,125, but PMH  of MI LDL goal=,70  . Unspecified essential hypertension   . Coronary atherosclerosis of unspecified type of vessel, native or graft   . Personal history of colonic polyps   . Osteoarthrosis, unspecified whether generalized or localized, unspecified site   . Elevated prostate specific antigen (PSA)   . Other cataract   . Coronary atherosclerosis of artery bypass graft   . Esophageal reflux   . Sleep apnea     uses CPAP  . Old myocardial infarction     2006  . Shortness of breath     DOE   Past Surgical History  Procedure Laterality Date  . Cataract extraction    . Colonoscopy w/ polypectomy    . Inguinal hernia repair    . Total knee arthroplasty  2005  . Coronary artery bypass graft      X 4   . Cardiac catheterization      2006  . Hernia repair      VENTRAL, LEFT & RIGHT GROIN  . Joint replacement    . Total knee arthroplasty  08/25/2011    Procedure: TOTAL KNEE ARTHROPLASTY;  Surgeon: Lorn Junes, MD;  Location: [place];  Service: Orthopedics;  Laterality: Right;  DR Noemi Chapel WANTS 35 MINUTES FOR SURGERY   History   Social History  . Marital Status: Widowed    Spouse Name: N/A    Number of Children: Y  . Years of Education: N/A   Occupational History  . retired    Social History Main Topics  . Smoking status: Former Smoker  -- 0.25 packs/day for 3 years    Types: Cigarettes    Quit date: 08/12/1961  . Smokeless tobacco: Former Systems developer    Quit date: 08/18/1961     Comment: during college  . Alcohol Use: 4.2 oz/week    4 Glasses of wine, 3 Cans of beer per week     Comment: Social  . Drug Use: No  . Sexual Activity: Not on file   Other Topics Concern  . Not on file   Social History Narrative   Lives alone.      Review of Systems Denies any fever or chills.No weight loss. No diarrhea, abdominal pain. Besides  blood, his stools are normal in contour. Not taking any OTC Advil or Motrin. He admits to mild cough.      Objective:   Physical Exam BP 143/86  Pulse 72  Temp(Src) 98.1 F (36.7 C)  Wt 204 lb (92.534 kg)  SpO2 94% General -- alert, well-developed, NAD.  HEENT-- Not pale.   Abdomen-- Not distended, good bowel sounds,soft, non-tender. No rebound or rigidity. No mass,organomegaly. Rectal-- 1 skin tag noted  externally . Normal sphincter tone. No rectal masses or tenderness.  Brown stool  Prostate--Prostate gland firm and smooth, no enlargement, nodularity, tenderness, mass, asymmetry or induration. Anoscopy-- Multiple, small internal hemorrhoids, they are  slightly red but no mucus or mucositis noted Extremities-- no pretibial edema bilaterally  Neurologic--  alert & oriented X3. Speech normal, gait normal, strength normal in all extremities.  Psych-- Cognition and judgment appear intact. Cooperative with normal attention span and concentration. No anxious or depressed appearing.      Assessment & Plan:  Rectal bleeding Had  2 episodes of rectal bleeding, examination showed a single external skin tag and internal hemorrhoids that look slt irritated. Had a colonoscopy 07/17/2000 and 02-2006 (tics, int hemorrhoids) Per chart review his next colonoscopy is scheduled for 2017 Rectal bleeding is most likely benign, only way to be sure is to repeat a colonoscopy and  the option to refer to GI  was discussed however I'm not convinced that is necessary unless he has more severe or persistent symptoms. See instructions. Check a CBC to be sure there is no anemia

## 2013-08-03 NOTE — Progress Notes (Signed)
Pre visit review using our clinic review tool, if applicable. No additional management support is needed unless otherwise documented below in the visit note. 

## 2013-08-03 NOTE — Telephone Encounter (Signed)
Encounter closed due to appt with Paz.

## 2013-08-03 NOTE — Telephone Encounter (Signed)
Patient Information:  Caller Name: Harrington  Phone: [phone]  Patient: Benjamin Adkins, Benjamin Adkins  Gender: Male  DOB: [birth year]  Age: 73 Years  PCP: Unice Cobble  Office Follow Up:  Does the office need to follow up with this patient?: No  Instructions For The Office: N/A   Symptoms  Reason For Call & Symptoms: Reports blood in stool x 2 days.  Reviewed Health History In EMR: Yes  Reviewed Medications In EMR: Yes  Reviewed Allergies In EMR: Yes  Reviewed Surgeries / Procedures: Yes  Date of Onset of Symptoms: 08/02/2013  Treatments Tried: Tramadol for back pain was taken one day the past week.  Treatments Tried Worked: No  Guideline(s) Used:  Rectal Bleeding  Disposition Per Guideline:   See Today in Office  Reason For Disposition Reached:   All other patients with rectal bleeding (Exceptions: blood just on toilet paper, few drops, streaks on surface of normal formed BM)  Advice Given:  N/A  Patient Will Follow Care Advice:  YES  Appointment Scheduled:  08/03/2013 15:45:00 Appointment Scheduled Provider:  Kathlene November

## 2013-08-03 NOTE — Patient Instructions (Signed)
Drink plenty of fluids, eat  fruits and vegetables Metamucil 2 capsules daily with fluids  Use the suppositories once a day for 3 days and then as needed Call anytime if you have pain, itching or severe/recurrent bleeding.

## 2013-08-04 LAB — CBC WITH DIFFERENTIAL/PLATELET
BASOS PCT: 0.1 % (ref 0.0–3.0)
Basophils Absolute: 0 10*3/uL (ref 0.0–0.1)
EOS PCT: 0.3 % (ref 0.0–5.0)
Eosinophils Absolute: 0 10*3/uL (ref 0.0–0.7)
HCT: 43.7 % (ref 39.0–52.0)
HEMOGLOBIN: 15.3 g/dL (ref 13.0–17.0)
Lymphocytes Relative: 12.7 % (ref 12.0–46.0)
Lymphs Abs: 1.6 10*3/uL (ref 0.7–4.0)
MCHC: 35 g/dL (ref 30.0–36.0)
MCV: 91.8 fl (ref 78.0–100.0)
MONOS PCT: 2.1 % — AB (ref 3.0–12.0)
Monocytes Absolute: 0.3 10*3/uL (ref 0.1–1.0)
Neutro Abs: 10.6 10*3/uL — ABNORMAL HIGH (ref 1.4–7.7)
Neutrophils Relative %: 84.8 % — ABNORMAL HIGH (ref 43.0–77.0)
Platelets: 227 10*3/uL (ref 150.0–400.0)
RBC: 4.76 Mil/uL (ref 4.22–5.81)
RDW: 13.1 % (ref 11.5–14.6)
WBC: 12.4 10*3/uL — ABNORMAL HIGH (ref 4.5–10.5)

## 2013-08-05 ENCOUNTER — Encounter: Payer: Self-pay | Admitting: Internal Medicine

## 2013-08-08 ENCOUNTER — Telehealth: Payer: Self-pay | Admitting: *Deleted

## 2013-08-08 NOTE — Telephone Encounter (Signed)
Patient has contacted the office to schedule the Pneumonia and Tdap injections. In lieu of the elevated WBC, do you want him to wait on this  until evaluated by hematology?

## 2013-08-08 NOTE — Telephone Encounter (Signed)
   The elevated white count would not preclude these injections. We would want his immune status evaluated before he took the shingles shot however.

## 2013-08-08 NOTE — Addendum Note (Signed)
Addended by: Peggyann Shoals on: 08/08/2013 08:51 AM   Modules accepted: Orders

## 2013-08-09 ENCOUNTER — Ambulatory Visit (INDEPENDENT_AMBULATORY_CARE_PROVIDER_SITE_OTHER): Payer: Medicare Other | Admitting: *Deleted

## 2013-08-09 ENCOUNTER — Encounter: Payer: Self-pay | Admitting: Internal Medicine

## 2013-08-09 DIAGNOSIS — Z23 Encounter for immunization: Secondary | ICD-10-CM

## 2013-08-11 ENCOUNTER — Ambulatory Visit (HOSPITAL_BASED_OUTPATIENT_CLINIC_OR_DEPARTMENT_OTHER): Payer: 59 | Admitting: Hematology & Oncology

## 2013-08-11 ENCOUNTER — Ambulatory Visit: Payer: Medicare Other

## 2013-08-11 ENCOUNTER — Other Ambulatory Visit (HOSPITAL_BASED_OUTPATIENT_CLINIC_OR_DEPARTMENT_OTHER): Payer: Medicare Other | Admitting: Lab

## 2013-08-11 ENCOUNTER — Encounter: Payer: Self-pay | Admitting: Hematology & Oncology

## 2013-08-11 VITALS — BP 155/87 | HR 59 | Temp 98.3°F | Resp 18 | Ht 68.0 in | Wt 205.0 lb

## 2013-08-11 DIAGNOSIS — D72819 Decreased white blood cell count, unspecified: Secondary | ICD-10-CM

## 2013-08-11 DIAGNOSIS — D72829 Elevated white blood cell count, unspecified: Secondary | ICD-10-CM

## 2013-08-11 LAB — CHCC SATELLITE - SMEAR

## 2013-08-11 LAB — CBC WITH DIFFERENTIAL (CANCER CENTER ONLY)
BASO#: 0 10*3/uL (ref 0.0–0.2)
BASO%: 0.2 % (ref 0.0–2.0)
EOS%: 2 % (ref 0.0–7.0)
Eosinophils Absolute: 0.2 10*3/uL (ref 0.0–0.5)
HCT: 42.7 % (ref 38.7–49.9)
HEMOGLOBIN: 14.8 g/dL (ref 13.0–17.1)
LYMPH#: 2.4 10*3/uL (ref 0.9–3.3)
LYMPH%: 28.9 % (ref 14.0–48.0)
MCH: 31.3 pg (ref 28.0–33.4)
MCHC: 34.7 g/dL (ref 32.0–35.9)
MCV: 90 fL (ref 82–98)
MONO#: 0.8 10*3/uL (ref 0.1–0.9)
MONO%: 9.9 % (ref 0.0–13.0)
NEUT#: 4.8 10*3/uL (ref 1.5–6.5)
NEUT%: 59 % (ref 40.0–80.0)
PLATELETS: 161 10*3/uL (ref 145–400)
RBC: 4.73 10*6/uL (ref 4.20–5.70)
RDW: 12.6 % (ref 11.1–15.7)
WBC: 8.2 10*3/uL (ref 4.0–10.0)

## 2013-08-12 NOTE — Progress Notes (Signed)
This office note has been dictated.

## 2013-08-15 NOTE — Progress Notes (Signed)
CC:   Kathlene November, MD  DIAGNOSIS:  Transient leukocytosis.  HISTORY OF PRESENT ILLNESS:  Mr. Plascencia is a very nice 73 year old white gentleman.  He is originally from [facility].  He works for [employer].  He is going to [place] in a couple of weeks for a tour.  This is sponsored by, I think, his church.  He is looking forward to this.  He is followed by Dr. Linna Darner.  However, since Dr. Linna Darner is leaving, his care is being transferred over to Dr. Kathlene November.  Dr. Larose Kells noted that he had some leukocytosis.  Going through his record, he has had leukocytosis for a while.  Going back to February 2013, his white cell count was 13.6.  Back in December 2010, his white cell count was 3.2.  In January of this year, a CBC was done which showed a white cell count of 12.4, hemoglobin 15.3, hematocrit 43.7, platelet count 227.  MCV was 92.  His white cell differential showed 85 segs, 13 lymphs, 2 monos.  He has had some rectal bleeding.  He does have some hemorrhoidal issues.  He has had no fever.  He has had no swollen lymph nodes.  He has had no weight loss or weight gain.  He has had no new medications given.  There has been no __________, otherwise.  He does use hydrocortisone suppositories.  He said he started using __________ about, I think, 4 days ago.  He has not noted any changes in vision.  There is no headache.  We were asked to see him to see if there is any issues with respect to his bone marrow that could be causing this leukocytosis.  Overall, his performance status is ECOG 0.  He has had knee surgery in the past.  He has been pretty active.  He wants to try to lose a little weight.  He, I think, strained his body a little bit.  He thinks that hemorrhoidal bleeding might be from moving the rocks in his backyard.  PAST MEDICAL HISTORY:  Remarkable for: 1. Coronary artery disease, status post myocardial infarction, status     post bypass graft. 2. Hyperlipidemia. 3.  Hypertension. 4. Obstructive sleep apnea. 5. GERD.  ALLERGIES:  None.  MEDICATIONS: 1. Lipitor 40 mg p.o. daily. 2. Celebrex 100 mg p.o. daily as needed. 3. Toprol-XL 25 mg p.o. daily. 4. Prilosec 20 mg p.o. daily. 5. Ultram 50 mg p.o. as needed.  SOCIAL HISTORY:  Remarkable for past tobacco use, this was while in college.  He has occasional alcohol use.  He has no obvious occupational exposures.  FAMILY HISTORY:  Remarkable for: 1. Father died of stomach cancer at 30 years old. 2. Breast cancer in his mother and sister. 3. Crohn disease. 4. Hypertension.  REVIEW OF SYSTEMS:  As stated in history of present illness.  PHYSICAL EXAMINATION:  General:  This is a well-developed, well- nourished white gentleman, in no obvious distress.  Vital Signs: Temperature of 98.3, pulse 59, respiratory rate 18, blood pressure 155/87.  Weight is 205 pounds.  Head and Neck:  Normocephalic, atraumatic skull.  He has no scleral icterus.  There are no oral lesions.  There is no adenopathy in the neck.  Lungs:  Clear bilaterally.  Cardiac:  Regular rate and rhythm with a normal S1 and S2. There are no murmurs, rubs, or bruits.  Abdomen:  Soft.  He has good bowel sounds.  There is no fluid wave.  There is no palpable  abdominal mass.  There is no palpable hepatosplenomegaly.  Axillary:  No bilateral axillary adenopathy.  Back:  No tenderness over the spine, ribs, or hips.  Extremities:  No clubbing, cyanosis, or edema.  He has good range motion of his joints.  Skin:  No rashes, ecchymoses, or petechiae. Neurologic:  No focal neurological deficits.  LABORATORY STUDIES:  White cell count 8.2, hemoglobin 14.8, hematocrit 42.7, platelet count is 161.  MCV is 90.  White cell differential shows 59 segs, 29 lymphs, 10 monos.  On his peripheral smear, he has a normochromic, normocytic population of red blood cells.  There are no nucleated red blood cells.  I see no teardrop cells.  He has no rouleaux  formation.  There are no target cells.  White cells appeared normal in morphology and maturation.  I do not see any immature myeloid or lymphoid forms.  There are no hypersegmented polys.  There are no atypical lymphocytes.  I see no blasts.  Platelets are adequate in number and size.  IMPRESSION:  Mr. Ranganathan is a really nice 73 year old white gentleman.  He has transient leukocytosis.  I have to believe this is reactive.  I find it interesting that just a week ago, his white cell count was elevated.  He had predominance of neutrophils.  __________ as if he was on steroids.  I just wonder if the Anusol suppositories that he uses might have been a factor.  I did not see anything that would suggest an underlying bone marrow myeloproliferative state.  His exam pretty much is unremarkable.  His blood smear certainly did not show anything that would suggest a bone marrow disorder.  Again, I have to believe that this transient leukocytosis is somehow reactive.  I do not see any problems with him going over to [place].  I told him that he must take baby aspirin.  He also needs to drink a lot of water.  I would like to see back in 3 months.  I think if everything looks fine in 3 months, then I do not see that we would have to see him again.  Again, I do not know if this hemorrhoidal bleeding that he has might cause some transient demargination of white cells and thus increases white cell count.  I spent greater than 45-50 minutes with him.  I answered all his questions.  I reviewed his lab work with him.    ______________________________ Volanda Napoleon, M.D. PRE/MEDQ  D:  08/12/2013  T:  08/13/2013  Job:  [id number]

## 2013-08-23 ENCOUNTER — Ambulatory Visit (INDEPENDENT_AMBULATORY_CARE_PROVIDER_SITE_OTHER): Payer: Medicare Other | Admitting: *Deleted

## 2013-08-23 DIAGNOSIS — Z23 Encounter for immunization: Secondary | ICD-10-CM

## 2013-09-08 ENCOUNTER — Ambulatory Visit (INDEPENDENT_AMBULATORY_CARE_PROVIDER_SITE_OTHER): Payer: 59 | Admitting: Internal Medicine

## 2013-09-08 ENCOUNTER — Encounter: Payer: Self-pay | Admitting: Internal Medicine

## 2013-09-08 VITALS — BP 149/76 | HR 77 | Temp 97.6°F | Wt 207.0 lb

## 2013-09-08 DIAGNOSIS — R05 Cough: Secondary | ICD-10-CM

## 2013-09-08 DIAGNOSIS — R059 Cough, unspecified: Secondary | ICD-10-CM

## 2013-09-08 MED ORDER — HYDROCODONE-HOMATROPINE 5-1.5 MG/5ML PO SYRP: 5.0000 mL | ORAL_SOLUTION | Freq: Every evening | ORAL | Status: DC | PRN

## 2013-09-08 MED ORDER — AZELASTINE HCL 0.1 % NA SOLN
2.0000 | Freq: Every evening | NASAL | Status: DC | PRN
Start: 2013-09-08 — End: 2015-12-18

## 2013-09-08 MED ORDER — AZITHROMYCIN 250 MG PO TABS: ORAL_TABLET | ORAL | Status: DC

## 2013-09-08 NOTE — Progress Notes (Signed)
Pre visit review using our clinic review tool, if applicable. No additional management support is needed unless otherwise documented below in the visit note. 

## 2013-09-08 NOTE — Patient Instructions (Signed)
Rest, fluids , tylenol For cough, take Mucinex DM twice a day as needed  For severe cough at night, try hydrocodone, will cause drowsiness  For congestion use  OTC Nasocort: 2 nasal sprays on each side of the nose in the morning until you feel better Astelin 2 nasal sprays on each side at night until better    Take the antibiotic as prescribed  (zithromax)  Call if no better in few days Call anytime if the symptoms are severe

## 2013-09-08 NOTE — Progress Notes (Signed)
Subjective:    Patient ID: Benjamin Adkins, male    DOB: [birth year], 73 y.o.   MRN: [id number]  DOS:  09/08/2013  Acute visit, The patient had a flu like illness in Christmas, since then he has persisting cough, worse at night. He has a very small amount of sputum production. He just came back from a trip to [place].    ROS Denies chest pain or difficulty breathing No fevers No sinus pain. + Postnasal dripping which he thinks is causing some of the  cough. Also is blowing and has yellow nasal discharge   Past Medical History  Diagnosis Date  . Hyperlipidemia     nmr lipoprofile 2004: LDL 154 (1653/711), HDL 42, TG 80.NMR  LDL =,125, but PMH  of MI LDL goal=,70  . Unspecified essential hypertension   . Coronary atherosclerosis of unspecified type of vessel, native or graft   . Personal history of colonic polyps   . Osteoarthrosis, unspecified whether generalized or localized, unspecified site   . Elevated prostate specific antigen (PSA)   . Other cataract   . Coronary atherosclerosis of artery bypass graft   . Esophageal reflux   . Sleep apnea     uses CPAP  . Old myocardial infarction     2006  . Shortness of breath     DOE    Past Surgical History  Procedure Laterality Date  . Cataract extraction    . Colonoscopy w/ polypectomy    . Inguinal hernia repair    . Total knee arthroplasty  2005  . Coronary artery bypass graft      X 4   . Cardiac catheterization      2006  . Hernia repair      VENTRAL, LEFT & RIGHT GROIN  . Joint replacement    . Total knee arthroplasty  08/25/2011    Procedure: TOTAL KNEE ARTHROPLASTY;  Surgeon: Lorn Junes, MD;  Location: [facility];  Service: Orthopedics;  Laterality: Right;  DR Noemi Chapel WANTS 62 MINUTES FOR SURGERY    History   Social History  . Marital Status: Widowed    Spouse Name: N/A    Number of Children: Y  . Years of Education: N/A   Occupational History  . retired    Social History Main Topics  . Smoking status:  Former Smoker -- 0.25 packs/day for 3 years    Types: Cigarettes    Start date: 04/12/1959    Quit date: 08/12/1961  . Smokeless tobacco: Never Used     Comment: during college  . Alcohol Use: 4.2 oz/week    4 Glasses of wine, 3 Cans of beer per week     Comment: Social  . Drug Use: No  . Sexual Activity: Not on file   Other Topics Concern  . Not on file   Social History Narrative   Lives alone.         Medication List       This list is accurate as of: 09/08/13  4:27 PM.  Always use your most recent med list.               amoxicillin 500 MG capsule  Commonly known as:  AMOXIL  TAKE 1 HOUR PRIOR TO PROCEDURE AS DIRECTED     atorvastatin 40 MG tablet  Commonly known as:  LIPITOR  Take 40 mg by mouth daily.     azelastine 137 MCG/SPRAY nasal spray  Commonly known as:  ASTELIN  Place  2 sprays into both nostrils at bedtime as needed for rhinitis.     azithromycin 250 MG tablet  Commonly known as:  ZITHROMAX Z-PAK  As directed     celecoxib 100 MG capsule  Commonly known as:  CELEBREX  Take 100 mg by mouth as needed. Marland Kitchen     HYDROcodone-homatropine 5-1.5 MG/5ML syrup  Commonly known as:  HYCODAN  Take 5 mLs by mouth at bedtime as needed for cough.     metoprolol succinate 25 MG 24 hr tablet  Commonly known as:  TOPROL-XL  TAKE 1 TABLET BY MOUTH DAILY     multivitamin capsule  Take 1 capsule by mouth daily.     nitroGLYCERIN 0.4 MG SL tablet  Commonly known as:  NITROSTAT  Place 0.4 mg under the tongue every 5 (five) minutes as needed. Chest pain     omeprazole 20 MG capsule  Commonly known as:  PRILOSEC  1 by mouth daily     traMADol 50 MG tablet  Commonly known as:  ULTRAM  Take 50 mg by mouth as needed.           Objective:   Physical Exam BP 149/76  Pulse 77  Temp(Src) 97.6 F (36.4 C)  Wt 207 lb (93.895 kg)  SpO2 98% General -- alert, well-developed, NAD.   HEENT-- Not pale. TMs normal, throat symmetric, no redness or discharge. Face  symmetric, sinuses not tender to palpation. Nose  congested. Lungs -- normal respiratory effort, no intercostal retractions, no accessory muscle use, and normal breath sounds.  Heart-- normal rate, regular rhythm, no murmur.   Neurologic--  alert & oriented X3. Speech normal, gait normal, strength normal in all extremities.  Psych-- Cognition and judgment appear intact. Cooperative with normal attention span and concentration. No anxious or depressed appearing.      Assessment & Plan:   Cough, Cough for several weeks, symptoms started after a flulike illness. he could have an atypical infection or sx could be related to postnasal dripping. Plan: Zithromax, treat nasal congestion and postnasal dripping with Astelin and nasal steroids. If not better he will need further eval. See instructions

## 2013-09-09 ENCOUNTER — Encounter: Payer: Self-pay | Admitting: Internal Medicine

## 2013-11-17 ENCOUNTER — Other Ambulatory Visit: Payer: Self-pay | Admitting: Internal Medicine

## 2013-11-21 ENCOUNTER — Telehealth: Payer: Self-pay | Admitting: Internal Medicine

## 2013-11-21 ENCOUNTER — Other Ambulatory Visit: Payer: Self-pay | Admitting: Internal Medicine

## 2013-11-21 DIAGNOSIS — K649 Unspecified hemorrhoids: Secondary | ICD-10-CM

## 2013-11-21 NOTE — Telephone Encounter (Signed)
Refill for anusol sent to [facility] in [place]

## 2014-01-02 ENCOUNTER — Telehealth: Payer: Self-pay | Admitting: Internal Medicine

## 2014-01-02 NOTE — Telephone Encounter (Signed)
Patient scheduled for Medicare Wellness on [place] 01/26/14 at 8:30.

## 2014-01-02 NOTE — Telephone Encounter (Signed)
Caller name: [name]  Call back number: [phone]  Reason for call:   Pt got a letter in the mail stating that if he got his medical wellness exam done by the end of June, he would get a 75 gift card.  Pt wants to know if he can get squeezed in or will he have to wait until the next available apt.  Please advise.

## 2014-01-06 ENCOUNTER — Other Ambulatory Visit: Payer: Self-pay | Admitting: Internal Medicine

## 2014-01-10 ENCOUNTER — Other Ambulatory Visit: Payer: Self-pay | Admitting: Internal Medicine

## 2014-01-11 ENCOUNTER — Telehealth: Payer: Self-pay | Admitting: *Deleted

## 2014-01-11 NOTE — Telephone Encounter (Signed)
Ok 30 and 1 RF 

## 2014-01-11 NOTE — Telephone Encounter (Signed)
rx refill celebrex 100mg   Last refilled - historical provider- okay to refill ?  appointment pending 01/26/14 @ DR.PAZ

## 2014-01-11 NOTE — Telephone Encounter (Signed)
Done

## 2014-01-13 ENCOUNTER — Ambulatory Visit: Payer: 59 | Admitting: Internal Medicine

## 2014-01-25 ENCOUNTER — Telehealth: Payer: Self-pay | Admitting: *Deleted

## 2014-01-25 NOTE — Telephone Encounter (Signed)
Medication List and allergies:  Reviewed and updated  90 Day supply/mail order: N/A Local prescriptions: [facility] [place], 90 day  Immunizations Due: UTD  A/P FH, PSH, or Personal Hx: Reviewed and updated Flu vaccine: 04/2013 Tdap: 08/2103 PNA: 07/2013 Shingles:06/2011 CCS: 02/2006 Diverticulosis, internal hemorrhoids-->10 years PSA: 05/2008 3.29  To Discuss with Provider: Right great toe with toenail fungus for over 1.5 years. Has used Fungi nail but has not helped.

## 2014-01-26 ENCOUNTER — Ambulatory Visit (INDEPENDENT_AMBULATORY_CARE_PROVIDER_SITE_OTHER): Payer: 59 | Admitting: Internal Medicine

## 2014-01-26 ENCOUNTER — Encounter: Payer: Self-pay | Admitting: Internal Medicine

## 2014-01-26 VITALS — BP 118/74 | HR 65 | Temp 97.8°F | Ht 69.0 in | Wt 207.0 lb

## 2014-01-26 DIAGNOSIS — I10 Essential (primary) hypertension: Secondary | ICD-10-CM

## 2014-01-26 DIAGNOSIS — Z0000 Encounter for general adult medical examination without abnormal findings: Secondary | ICD-10-CM | POA: Insufficient documentation

## 2014-01-26 DIAGNOSIS — R972 Elevated prostate specific antigen [PSA]: Secondary | ICD-10-CM

## 2014-01-26 DIAGNOSIS — B351 Tinea unguium: Secondary | ICD-10-CM

## 2014-01-26 DIAGNOSIS — E785 Hyperlipidemia, unspecified: Secondary | ICD-10-CM

## 2014-01-26 DIAGNOSIS — I2510 Atherosclerotic heart disease of native coronary artery without angina pectoris: Secondary | ICD-10-CM

## 2014-01-26 DIAGNOSIS — M1990 Unspecified osteoarthritis, unspecified site: Secondary | ICD-10-CM

## 2014-01-26 LAB — COMPREHENSIVE METABOLIC PANEL
ALT: 25 U/L (ref 0–53)
AST: 28 U/L (ref 0–37)
Albumin: 4.3 g/dL (ref 3.5–5.2)
Alkaline Phosphatase: 71 U/L (ref 39–117)
BILIRUBIN TOTAL: 0.8 mg/dL (ref 0.2–1.2)
BUN: 19 mg/dL (ref 6–23)
CALCIUM: 9.6 mg/dL (ref 8.4–10.5)
CHLORIDE: 102 meq/L (ref 96–112)
CO2: 22 meq/L (ref 19–32)
CREATININE: 0.9 mg/dL (ref 0.4–1.5)
GFR: 84.71 mL/min (ref >60.00)
Glucose, Bld: 120 mg/dL — ABNORMAL HIGH (ref 70–99)
Potassium: 4.4 mEq/L (ref 3.5–5.1)
Sodium: 134 mEq/L — ABNORMAL LOW (ref 135–145)
Total Protein: 7.2 g/dL (ref 6.0–8.3)

## 2014-01-26 LAB — LIPID PANEL
CHOLESTEROL: 154 mg/dL (ref 0–200)
HDL: 44 mg/dL (ref >39.00)
LDL Cholesterol: 87 mg/dL (ref 0–99)
NonHDL: 110
TRIGLYCERIDES: 115 mg/dL (ref 0.0–149.0)
Total CHOL/HDL Ratio: 4
VLDL: 23 mg/dL (ref 0.0–40.0)

## 2014-01-26 LAB — PSA: PSA: 5.1 ng/mL — ABNORMAL HIGH (ref 0.10–4.00)

## 2014-01-26 MED ORDER — EFINACONAZOLE 10 % EX SOLN: CUTANEOUS | Status: DC

## 2014-01-26 NOTE — Assessment & Plan Note (Signed)
BP  controlled, check a BMP

## 2014-01-26 NOTE — Assessment & Plan Note (Signed)
DRE normal, no recent PSAs, labs

## 2014-01-26 NOTE — Patient Instructions (Signed)
Get your blood work before you leave     Next visit is for routine check up in 6-8 months  No need to come back fasting Please make an appointment

## 2014-01-26 NOTE — Progress Notes (Signed)
Subjective:    Patient ID: Benjamin Adkins, male    DOB: 1940-10-10, 73 y.o.   MRN: [id number]  DOS:  01/26/2014 Type of visit - description: Here for Medicare AWV:  1. Risk factors based on Past M, S, F history: reviewed 2. Physical Activities:  Golf, yard work, stationary bike 3. Depression/mood: neg screening  4. Hearing:  No problemss noted or reported  5. ADL's:  Independent  6. Fall Risk: no recent fall, precautions discussed  7. home Safety: does feel safe at home  8. Height, weight, & visual acuity: see VS, vision normal, does have regular checkups, h/o catarct B 9. Counseling: provided 10. Labs ordered based on risk factors: if needed  11. Referral Coordination: if needed 12. Care Plan, see assessment and plan  13. Cognitive Assessment: Motor skills and cognition seems to be > average for  age  In addition, today we discussed the following: Hyperlipidemia, good compliance with medication DJD, on tramadol and Celebrex as needed only GERD, symptoms well controlled with PPIs 1.5 years history of dystrophic right toe nail, has use OTC medications without relief, interested in further treatment.  ROS No  CP, SOB No palpitations, no lower extremity edema Some  DOE at baseline Denies  nausea, vomiting diarrhea, blood in the stools  (-) cough, sputum production (-) wheezing, chest congestion No dysuria, gross hematuria, difficulty urinating     Past Medical History  Diagnosis Date  . Hyperlipidemia     nmr lipoprofile 2004: LDL 154 (1653/711), HDL 42, TG 80.NMR  LDL =,125, but PMH  of MI LDL goal=,70  . HTN (hypertension)   . Coronary atherosclerosis of unspecified type of vessel, native or graft     MI 2006, h/o CABG  . Personal history of colonic polyps   . DJD (degenerative joint disease)   . Elevated prostate specific antigen (PSA)   . Other cataract   . Esophageal reflux   . Sleep apnea     uses CPAP  . Shortness of breath     DOE    Past Surgical History    Procedure Laterality Date  . Cataract extraction    . Colonoscopy w/ polypectomy    . Inguinal hernia repair    . Total knee arthroplasty  2005  . Coronary artery bypass graft  2006    X 4   . Cardiac catheterization      2006  . Hernia repair      VENTRAL, LEFT & RIGHT GROIN  . Total knee arthroplasty      2006 (before MI)  . Total knee arthroplasty  08/25/2011    Procedure: TOTAL KNEE ARTHROPLASTY;  Surgeon: Lorn Junes, MD;  Location: [facility];  Service: Orthopedics;  Laterality: Right;  DR Noemi Chapel WANTS 84 MINUTES FOR SURGERY    History   Social History  . Marital Status: Widowed    Spouse Name: N/A    Number of Children: 1  . Years of Education: N/A   Occupational History  . retired    Social History Main Topics  . Smoking status: Former Smoker -- 0.25 packs/day for 3 years    Types: Cigarettes    Start date: 04/12/1959    Quit date: 08/12/1961  . Smokeless tobacco: Never Used     Comment: during college  . Alcohol Use: 4.2 oz/week    4 Glasses of wine, 3 Cans of beer per week     Comment: Social  . Drug Use: No  .  Sexual Activity: Not on file   Other Topics Concern  . Not on file   Social History Narrative   Lives alone.      Family History  Problem Relation Age of Onset  . Cancer Father     stomach  . Stomach cancer Father   . Hypertension Mother   . Breast cancer Mother   . Coronary artery disease Mother   . Heart disease Mother   . Breast cancer Sister   . Crohn's disease Sister   . Arthritis Sister   . Heart attack Paternal Grandfather 56  . Arthritis Brother   . Colon cancer Neg Hx   . Prostate cancer Neg Hx        Medication List       This list is accurate as of: 01/26/14  4:12 PM.  Always use your most recent med list.               atorvastatin 40 MG tablet  Commonly known as:  LIPITOR  Take 40 mg by mouth daily.     azelastine 0.1 % nasal spray  Commonly known as:  ASTELIN  Place 2 sprays into both nostrils at bedtime as  needed for rhinitis.     celecoxib 100 MG capsule  Commonly known as:  CELEBREX  TAKE ONE CAPSULE BY MOUTH TWICE A DAY AS NEEDED REGULAR USE CAN INCREASE RISK OF BLEEDING     Efinaconazole 10 % Soln  Commonly known as:  JUBLIA  Apply daily to toe nail x 48 weeks total     hydrocortisone 25 MG suppository  Commonly known as:  ANUSOL-HC  PLACE 1 SUPPOSITORY (25 MG TOTAL) RECTALLY DAILY AS NEEDED FOR HEMORRHOIDS OR ITCHING.     metoprolol succinate 25 MG 24 hr tablet  Commonly known as:  TOPROL-XL  TAKE 1 TABLET BY MOUTH DAILY     multivitamin capsule  Take 1 capsule by mouth daily.     omeprazole 20 MG capsule  Commonly known as:  PRILOSEC  TAKE ONE CAPSULE BY MOUTH EVERY DAY     traMADol 50 MG tablet  Commonly known as:  ULTRAM  Take 50 mg by mouth as needed.           Objective:   Physical Exam BP 118/74  Pulse 65  Temp(Src) 97.8 F (36.6 C)  Ht 5\' 9"  (1.753 m)  Wt 207 lb (93.895 kg)  BMI 30.55 kg/m2  SpO2 97% General -- alert, well-developed, NAD.  Neck --no thyromegaly , normal carotid pulse  HEENT-- Not pale.   Lungs -- normal respiratory effort, no intercostal retractions, no accessory muscle use, and normal breath sounds.  Heart-- normal rate, regular rhythm, no murmur.  Abdomen-- Not distended, good bowel sounds,soft, non-tender. No bruit . Rectal--   Normal sphincter tone. No rectal masses or tenderness. Stool brown   Prostate--Prostate gland firm and smooth, no enlargement, nodularity, tenderness, mass, asymmetry or induration. Extremities-- no pretibial edema bilaterally ; dystrophic R great toe nail Neurologic--  alert & oriented X3. Speech normal, gait appropriate for age, strength symmetric and appropriate for age.  Psych-- Cognition and judgment appear intact. Cooperative with normal attention span and concentration. No anxious or depressed appearing.      Assessment & Plan:

## 2014-01-26 NOTE — Assessment & Plan Note (Signed)
Currently asymptomatic, continue with aspirin, statins and beta blockers. Last visit with cardiology 2013,   Myoview was neg   For now we'll continue with present care and controlling cardiovascular risk factors

## 2014-01-26 NOTE — Assessment & Plan Note (Signed)
Last LDL > 100, continue with Lipitor , labs

## 2014-01-26 NOTE — Assessment & Plan Note (Addendum)
Tdap: 08/2103  PNA: 07/2013  prevnar-- declined, will think about it  Shingles:06/2011   CCS: 02/2006 Diverticulosis, internal hemorrhoids-->10 years  Cont healthy lifestyle Toe nail fungus-- discussed oral vs topical rx, elected jublia

## 2014-01-26 NOTE — Progress Notes (Signed)
Pre visit review using our clinic review tool, if applicable. No additional management support is needed unless otherwise documented below in the visit note. 

## 2014-01-26 NOTE — Assessment & Plan Note (Signed)
Controlled on ultram-celebrex prn

## 2014-01-27 ENCOUNTER — Encounter: Payer: Self-pay | Admitting: *Deleted

## 2014-01-27 ENCOUNTER — Telehealth: Payer: Self-pay | Admitting: *Deleted

## 2014-01-27 ENCOUNTER — Telehealth: Payer: Self-pay | Admitting: Internal Medicine

## 2014-01-27 NOTE — Telephone Encounter (Signed)
Relevant patient education assigned to patient using Emmi. ° °

## 2014-01-27 NOTE — Telephone Encounter (Signed)
Message copied by Harl Bowie on Fri Jan 27, 2014  4:24 PM ------      Message from: Kathlene November E      Created: Fri Jan 27, 2014  9:04 AM       Advise patient,      Cholesterol is under excellent control.      The PSA is modestly elevated, needs to be rechecked in 6 months when he comes back pain      Good results, continue taking the same medications ------

## 2014-01-27 NOTE — Telephone Encounter (Signed)
LMOM @ (4:24pm) asking the pt to RTC regarding recent lab results.//AB/CMA

## 2014-01-27 NOTE — Telephone Encounter (Signed)
Spoke with the pt and informed him of recent lab results and note below.  Unable to schedule lab appt,so pt agreed to call back in Dec to schedule an lab appt to recheck PSA.//AB/CMA

## 2014-04-12 ENCOUNTER — Other Ambulatory Visit: Payer: Self-pay

## 2014-04-12 MED ORDER — METOPROLOL SUCCINATE ER 25 MG PO TB24: ORAL_TABLET | ORAL | Status: DC

## 2014-04-13 ENCOUNTER — Other Ambulatory Visit: Payer: Self-pay | Admitting: *Deleted

## 2014-04-13 MED ORDER — METOPROLOL SUCCINATE ER 25 MG PO TB24: ORAL_TABLET | ORAL | Status: DC

## 2014-05-08 ENCOUNTER — Other Ambulatory Visit: Payer: Self-pay

## 2014-05-08 MED ORDER — METOPROLOL SUCCINATE ER 25 MG PO TB24: ORAL_TABLET | ORAL | Status: DC

## 2014-05-17 ENCOUNTER — Encounter: Payer: Self-pay | Admitting: Cardiovascular Disease

## 2014-05-17 ENCOUNTER — Ambulatory Visit (INDEPENDENT_AMBULATORY_CARE_PROVIDER_SITE_OTHER): Payer: Medicare Other | Admitting: Cardiovascular Disease

## 2014-05-17 VITALS — BP 120/72 | HR 66 | Ht 69.0 in | Wt 205.0 lb

## 2014-05-17 DIAGNOSIS — E785 Hyperlipidemia, unspecified: Secondary | ICD-10-CM

## 2014-05-17 DIAGNOSIS — I4510 Unspecified right bundle-branch block: Secondary | ICD-10-CM

## 2014-05-17 DIAGNOSIS — I10 Essential (primary) hypertension: Secondary | ICD-10-CM

## 2014-05-17 DIAGNOSIS — M159 Polyosteoarthritis, unspecified: Secondary | ICD-10-CM

## 2014-05-17 DIAGNOSIS — M150 Primary generalized (osteo)arthritis: Secondary | ICD-10-CM

## 2014-05-17 DIAGNOSIS — I252 Old myocardial infarction: Secondary | ICD-10-CM

## 2014-05-17 NOTE — Progress Notes (Signed)
Patient ID: Benjamin Adkins, male   DOB: Nov 03, 1940, 73 y.o.   MRN: [id number] This is a 73 year old white male patient last seen by PA 2013 to clear for right t knee surgery . In 2005 he had a perioperative MI after left knee replacement. He required urgent stenting to the RCA and subsequent CABG. He did have a negative treadmill prior to his knee surgery in 2005. He has done well since that time. He has not had any stress test or echo since then. He has a history of a right bundle branch block.his lipids are controlled. He has no history of diabetes and does not smoke.   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  All other systems reviewed and negative  General: Affect appropriate Healthy:  appears stated age 73: normal Neck supple with no adenopathy JVP normal no bruits no thyromegaly Lungs clear with no wheezing and good diaphragmatic motion Heart:  S1/S2 no murmur, no rub, gallop or click PMI normal Abdomen: benighn, BS positve, no tenderness, no AAA no bruit.  No HSM or HJR Distal pulses intact with no bruits  S/p bilateral TKR;s  No edema Neuro non-focal Skin warm and dry No muscular weakness   Current Outpatient Prescriptions  Medication Sig Dispense Refill  . atorvastatin (LIPITOR) 40 MG tablet Take 40 mg by mouth daily.       Marland Kitchen azelastine (ASTELIN) 137 MCG/SPRAY nasal spray Place 2 sprays into both nostrils at bedtime as needed for rhinitis.  30 mL  0  . celecoxib (CELEBREX) 100 MG capsule TAKE ONE CAPSULE BY MOUTH TWICE A DAY AS NEEDED  REGULAR USE CAN INCREASE RISK OF BLEEDING  30 capsule  1  . Efinaconazole (JUBLIA) 10 % SOLN Apply daily to toe nail x 48 weeks total  8 mL  6  . hydrocortisone (ANUSOL-HC) 25 MG suppository PLACE 1 SUPPOSITORY (25 MG TOTAL) RECTALLY DAILY AS NEEDED FOR HEMORRHOIDS OR ITCHING.  12 suppository  0  . metoprolol succinate (TOPROL-XL) 25 MG 24 hr tablet TAKE 1 TABLET BY MOUTH DAILY  30 tablet  0  . Multiple Vitamin (MULTIVITAMIN) capsule Take 1 capsule by mouth daily.        Marland Kitchen omeprazole (PRILOSEC) 20 MG capsule TAKE ONE CAPSULE BY MOUTH EVERY DAY  90 capsule  1  . traMADol (ULTRAM) 50 MG tablet Take 50 mg by mouth as needed.       No current facility-administered medications for this visit.    Allergies  Review of patient's allergies indicates no known allergies.  Electrocardiogram: SR RBBB LAFB  Old IMI  2013  Today NSR RBBB LAFB  Old IMI no change   Assessment and Plan

## 2014-05-17 NOTE — Assessment & Plan Note (Signed)
Bifasicular block no change since MI  No high grade AV block  F/U ECG in a year

## 2014-05-17 NOTE — Assessment & Plan Note (Signed)
NO change in ECG  EF ok  Myovue 2013 non ischemic  Stable with no angina and good activity level.  Continue medical Rx

## 2014-05-17 NOTE — Assessment & Plan Note (Signed)
Cholesterol is at goal.  Continue current dose of statin and diet Rx.  No myalgias or side effects.  F/U  LFT's in 6 months. Lab Results  Component Value Date   LDLCALC 87 01/26/2014

## 2014-05-17 NOTE — Patient Instructions (Signed)
Your physician wants you to follow-up in: YEAR WITH DR NISHAN  You will receive a reminder letter in the mail two months in advance. If you don't receive a letter, please call our office to schedule the follow-up appointment.  Your physician recommends that you continue on your current medications as directed. Please refer to the Current Medication list given to you today. 

## 2014-05-17 NOTE — Assessment & Plan Note (Signed)
Well controlled.  Continue current medications and low sodium Dash type diet.    

## 2014-05-17 NOTE — Assessment & Plan Note (Signed)
S/P bilateral TKR;s  With good results back golfing regularly

## 2014-05-31 ENCOUNTER — Other Ambulatory Visit: Payer: Self-pay | Admitting: Cardiovascular Disease

## 2014-06-06 ENCOUNTER — Other Ambulatory Visit: Payer: Self-pay | Admitting: Internal Medicine

## 2014-06-09 ENCOUNTER — Other Ambulatory Visit: Payer: Self-pay | Admitting: Internal Medicine

## 2014-06-24 ENCOUNTER — Other Ambulatory Visit: Payer: Self-pay | Admitting: Internal Medicine

## 2014-08-10 ENCOUNTER — Ambulatory Visit (INDEPENDENT_AMBULATORY_CARE_PROVIDER_SITE_OTHER): Payer: Medicare Other

## 2014-08-10 DIAGNOSIS — Z23 Encounter for immunization: Secondary | ICD-10-CM

## 2014-08-10 NOTE — Progress Notes (Signed)
Pre visit review using our clinic review tool, if applicable. No additional management support is needed unless otherwise documented below in the visit note. Patient tolerated well.  

## 2014-08-12 ENCOUNTER — Telehealth: Payer: Self-pay | Admitting: Internal Medicine

## 2014-08-12 NOTE — Telephone Encounter (Signed)
Advise pt-- due for a ROV, we also need to check his PSA , please arrange

## 2014-08-14 NOTE — Telephone Encounter (Signed)
Letter printed and mailed to Pt informing him he is due for F/U with Dr. Larose Kells.

## 2014-08-31 ENCOUNTER — Other Ambulatory Visit: Payer: Self-pay

## 2014-08-31 ENCOUNTER — Encounter: Payer: Self-pay | Admitting: Medical

## 2014-08-31 ENCOUNTER — Telehealth: Payer: Self-pay | Admitting: Internal Medicine

## 2014-08-31 ENCOUNTER — Ambulatory Visit (INDEPENDENT_AMBULATORY_CARE_PROVIDER_SITE_OTHER): Payer: Medicare Other | Admitting: Medical

## 2014-08-31 VITALS — BP 158/79 | HR 67 | Temp 98.7°F | Ht 69.0 in | Wt 210.2 lb

## 2014-08-31 DIAGNOSIS — S46819A Strain of other muscles, fascia and tendons at shoulder and upper arm level, unspecified arm, initial encounter: Secondary | ICD-10-CM | POA: Insufficient documentation

## 2014-08-31 DIAGNOSIS — S46811A Strain of other muscles, fascia and tendons at shoulder and upper arm level, right arm, initial encounter: Secondary | ICD-10-CM

## 2014-08-31 MED ORDER — CYCLOBENZAPRINE HCL 10 MG PO TABS: 10.0000 mg | ORAL_TABLET | Freq: Every day | ORAL | Status: DC

## 2014-08-31 MED ORDER — CELECOXIB 100 MG PO CAPS: 100.0000 mg | ORAL_CAPSULE | Freq: Two times a day (BID) | ORAL | Status: DC

## 2014-08-31 MED ORDER — CYCLOBENZAPRINE HCL 10 MG PO TABS
10.0000 mg | ORAL_TABLET | Freq: Every day | ORAL | Status: DC
Start: 2014-08-31 — End: 2014-08-31

## 2014-08-31 MED ORDER — KETOROLAC TROMETHAMINE 60 MG/2ML IM SOLN
60.0000 mg | Freq: Once | INTRAMUSCULAR | Status: AC
  Administered 2014-08-31: 60 mg via INTRAMUSCULAR

## 2014-08-31 NOTE — Telephone Encounter (Signed)
Caller name: Eldor Relation to pt: self Call back number: [phone] Pharmacy:  Reason for call:   Patient states that his insurance company is requiring a prior auth for cyclobenzaprine (FLEXERIL). [employer] and medicare.

## 2014-08-31 NOTE — Patient Instructions (Signed)
Trapezius strain We gave you toradol 60 mg im today. Start flexeril 10 mg po every night for 7 days. Start celebrex tomorrow.  Follow up in 7 days or as needed.  Any mid cervical pain then will get xray. If pain persists may refer to PT

## 2014-08-31 NOTE — Assessment & Plan Note (Signed)
We gave you toradol 60 mg im today. Start flexeril 10 mg po every night for 7 days. Start celebrex tomorrow.  Follow up in 7 days or as needed.  Any mid cervical pain then will get xray. If pain persists may refer to PT

## 2014-08-31 NOTE — Progress Notes (Signed)
Subjective:    Patient ID: Benjamin Adkins, male    DOB: [birth year], 74 y.o.   MRN: [id number]  HPI   Pt in with some back pain. He points to his upper back/neck area. Pain shoooting to his rt shoulder and shoulder blade. When he moves his shoulder pain is worse. Playing golf other day but can't remember any injury. No pain on breating. No chest pain.  On discussion describes pain as more in trapezius.  Pt used old pain tablet. It appears he used tramadol.   Pt has celebrex at home. Past Medical History  Diagnosis Date  . Hyperlipidemia     nmr lipoprofile 2004: LDL 154 (1653/711), HDL 42, TG 80.NMR  LDL =,125, but PMH  of MI LDL goal=,70  . HTN (hypertension)   . Coronary atherosclerosis of unspecified type of vessel, native or graft     MI 2006, h/o CABG  . Personal history of colonic polyps   . DJD (degenerative joint disease)   . Elevated prostate specific antigen (PSA)   . Other cataract   . Esophageal reflux   . Sleep apnea     uses CPAP  . Shortness of breath     DOE    History   Social History  . Marital Status: Widowed    Spouse Name: N/A  . Number of Children: 1  . Years of Education: N/A   Occupational History  . retired    Social History Main Topics  . Smoking status: Former Smoker -- 0.25 packs/day for 3 years    Types: Cigarettes    Start date: 04/12/1959    Quit date: 08/12/1961  . Smokeless tobacco: Never Used     Comment: during college  . Alcohol Use: 4.2 oz/week    4 Glasses of wine, 3 Cans of beer per week     Comment: Social  . Drug Use: No  . Sexual Activity: Not on file   Other Topics Concern  . Not on file   Social History Narrative   Lives alone.     Past Surgical History  Procedure Laterality Date  . Cataract extraction    . Colonoscopy w/ polypectomy    . Inguinal hernia repair    . Total knee arthroplasty  2005  . Coronary artery bypass graft  2006    X 4   . Cardiac catheterization      2006  . Hernia repair     VENTRAL, LEFT & RIGHT GROIN  . Total knee arthroplasty      2006 (before MI)  . Total knee arthroplasty  08/25/2011    Procedure: TOTAL KNEE ARTHROPLASTY;  Surgeon: Lorn Junes, MD;  Location: [facility];  Service: Orthopedics;  Laterality: Right;  DR Noemi Chapel WANTS 70 MINUTES FOR SURGERY    Family History  Problem Relation Age of Onset  . Cancer Father     stomach  . Stomach cancer Father   . Hypertension Mother   . Breast cancer Mother   . Coronary artery disease Mother   . Heart disease Mother   . Breast cancer Sister   . Crohn's disease Sister   . Arthritis Sister   . Heart attack Paternal Grandfather 61  . Arthritis Brother   . Colon cancer Neg Hx   . Prostate cancer Neg Hx     No Known Allergies  Current Outpatient Prescriptions on File Prior to Visit  Medication Sig Dispense Refill  . atorvastatin (LIPITOR) 40 MG tablet Take  40 mg by mouth daily.     Marland Kitchen azelastine (ASTELIN) 137 MCG/SPRAY nasal spray Place 2 sprays into both nostrils at bedtime as needed for rhinitis. 30 mL 0  . Efinaconazole (JUBLIA) 10 % SOLN Apply daily to toe nail x 48 weeks total 8 mL 6  . hydrocortisone (ANUSOL-HC) 25 MG suppository PLACE 1 SUPPOSITORY (25 MG TOTAL) RECTALLY DAILY AS NEEDED FOR HEMORRHOIDS OR ITCHING. 12 suppository 0  . metoprolol succinate (TOPROL-XL) 25 MG 24 hr tablet TAKE 1 TABLET BY MOUTH DAILY 30 tablet 11  . Multiple Vitamin (MULTIVITAMIN) capsule Take 1 capsule by mouth daily.      Marland Kitchen omeprazole (PRILOSEC) 20 MG capsule TAKE ONE CAPSULE BY MOUTH EVERY DAY 90 capsule 1  . traMADol (ULTRAM) 50 MG tablet Take 50 mg by mouth as needed.     No current facility-administered medications on file prior to visit.    BP 158/79 mmHg  Pulse 67  Temp(Src) 98.7 F (37.1 C) (Oral)  Ht 5\' 9"  (1.753 m)  Wt 210 lb 3.2 oz (95.346 kg)  BMI 31.03 kg/m2  SpO2 94%     Review of Systems  Constitutional: Negative for fever, chills and fatigue.  Respiratory: Negative for cough, shortness of  breath and wheezing.   Cardiovascular: Negative for chest pain and palpitations.  Musculoskeletal: Negative for back pain.       Rt side trapezius pain.  Neurological: Negative for seizures, syncope, facial asymmetry, speech difficulty, weakness and light-headedness.  Hematological: Negative for adenopathy. Does not bruise/bleed easily.       Objective:   Physical Exam   General- No acute distress. Pleasant patient. Neck- Full range of motion, no jvd. No mid cervical spine tenderness. Rt trapezius tender to palpation. Pain mostly on palpation mid trapezius area medial to scapula. Lungs- Clear, even and unlabored. Heart- regular rate and rhythm. Neurologic- CNII- XII grossly intact.  Rt shoulder- good rom. No pain.  Back- upper thorax(except for rt trap) and mid tspine no tenderness.     Assessment & Plan:

## 2014-08-31 NOTE — Progress Notes (Signed)
Pre visit review using our clinic review tool, if applicable. No additional management support is needed unless otherwise documented below in the visit note. 

## 2014-09-01 ENCOUNTER — Ambulatory Visit: Payer: Medicare Other | Admitting: Internal Medicine

## 2014-09-01 NOTE — Telephone Encounter (Signed)
Clarify on flexeril request.

## 2014-09-01 NOTE — Telephone Encounter (Signed)
Prior authorization initiated. JG//CMA

## 2014-09-03 MED ORDER — METHOCARBAMOL 500 MG PO TABS: ORAL_TABLET | ORAL | Status: DC

## 2014-09-03 NOTE — Telephone Encounter (Signed)
Will advise pt he can use robaxin at night. Will send that rx in.

## 2014-09-04 NOTE — Telephone Encounter (Signed)
Ulyess Mort, RN           Just talked to you about this one. [facility] [address] states that they did not received robaxine

## 2014-09-04 NOTE — Telephone Encounter (Signed)
Benjamin Adkins,  Pharmacist stated that Robaxin was not covered by insurance.  Benjamin Adkins gave the following number to call [phone] for PA.  PA initiated.  Currently under clinical review.

## 2014-09-04 NOTE — Addendum Note (Signed)
Addended by: Rudene Anda on: 09/04/2014 10:33 AM   Modules accepted: Orders, Medications

## 2014-09-04 NOTE — Telephone Encounter (Addendum)
Pt notified and made aware.  No further questions or concerns voiced.  Flexeril removed from medication list.  Robaxin already listed.

## 2014-09-18 ENCOUNTER — Other Ambulatory Visit: Payer: Self-pay

## 2014-09-19 ENCOUNTER — Encounter: Payer: Self-pay | Admitting: Internal Medicine

## 2014-09-19 ENCOUNTER — Ambulatory Visit (INDEPENDENT_AMBULATORY_CARE_PROVIDER_SITE_OTHER): Payer: Medicare Other | Admitting: Internal Medicine

## 2014-09-19 VITALS — BP 132/78 | HR 75 | Temp 97.6°F | Ht 69.0 in | Wt 210.1 lb

## 2014-09-19 DIAGNOSIS — I10 Essential (primary) hypertension: Secondary | ICD-10-CM

## 2014-09-19 DIAGNOSIS — R739 Hyperglycemia, unspecified: Secondary | ICD-10-CM

## 2014-09-19 DIAGNOSIS — R972 Elevated prostate specific antigen [PSA]: Secondary | ICD-10-CM

## 2014-09-19 DIAGNOSIS — S46811A Strain of other muscles, fascia and tendons at shoulder and upper arm level, right arm, initial encounter: Secondary | ICD-10-CM

## 2014-09-19 LAB — PSA: PSA: 3.57 ng/mL (ref 0.10–4.00)

## 2014-09-19 LAB — HEMOGLOBIN A1C: HEMOGLOBIN A1C: 5.8 % (ref 4.6–6.5)

## 2014-09-19 NOTE — Assessment & Plan Note (Signed)
Resolved

## 2014-09-19 NOTE — Progress Notes (Signed)
Pre visit review using our clinic review tool, if applicable. No additional management support is needed unless otherwise documented below in the visit note. 

## 2014-09-19 NOTE — Assessment & Plan Note (Signed)
Last PSA slightly elevated, recheck today, DRE 01-2015 negative, patient is asymptomatic

## 2014-09-19 NOTE — Assessment & Plan Note (Signed)
Good compliance of medication, BP initially slightly elevated, recheck 132/78. Plan: No change

## 2014-09-19 NOTE — Patient Instructions (Signed)
Get your blood work before you leave   Check the  blood pressure  monthly   Be sure your blood pressure is between 110/65 and  145/85.  if it is consistently higher or lower, let me know      Please go to the front and schedule a visit  By 01-2015 for a physical exam     Come back fasting

## 2014-09-19 NOTE — Assessment & Plan Note (Signed)
History of mild hyperglycemia, last fasting CBG 120, checking an A1c

## 2014-09-19 NOTE — Progress Notes (Signed)
Subjective:    Patient ID: Benjamin Adkins, male    DOB: [birth year], 74 y.o.   MRN: [id number]  DOS:  09/19/2014 Type of visit - description : rov Interval history: No major concerns Had back pain 4 weeks ago, better now. Good compliance with all medications. No recent ambulatory BPs Saw cardiology, felt to be stable. labs reviewed, due for a A1c and a PSA     Review of Systems Trying to do better with diet and exercise, he just joined a gym Denies chest pain or difficulty breathing No dysuria, gross hematuria difficulty urinating  Past Medical History  Diagnosis Date  . Hyperlipidemia     nmr lipoprofile 2004: LDL 154 (1653/711), HDL 42, TG 80.NMR  LDL =,125, but PMH  of MI LDL goal=,70  . HTN (hypertension)   . Coronary atherosclerosis of unspecified type of vessel, native or graft     MI 2006, h/o CABG  . Personal history of colonic polyps   . DJD (degenerative joint disease)   . Elevated prostate specific antigen (PSA)   . Other cataract   . Esophageal reflux   . Sleep apnea     uses CPAP  . Shortness of breath     DOE    Past Surgical History  Procedure Laterality Date  . Cataract extraction    . Colonoscopy w/ polypectomy    . Inguinal hernia repair    . Total knee arthroplasty  2005  . Coronary artery bypass graft  2006    X 4   . Cardiac catheterization      2006  . Hernia repair      VENTRAL, LEFT & RIGHT GROIN  . Total knee arthroplasty      2006 (before MI)  . Total knee arthroplasty  08/25/2011    Procedure: TOTAL KNEE ARTHROPLASTY;  Surgeon: Lorn Junes, MD;  Location: [facility];  Service: Orthopedics;  Laterality: Right;  DR Noemi Chapel WANTS 77 MINUTES FOR SURGERY    History   Social History  . Marital Status: Widowed    Spouse Name: N/A  . Number of Children: 1  . Years of Education: N/A   Occupational History  . retired    Social History Main Topics  . Smoking status: Former Smoker -- 0.25 packs/day for 3 years    Types: Cigarettes   Start date: 04/12/1959    Quit date: 08/12/1961  . Smokeless tobacco: Never Used     Comment: during college  . Alcohol Use: 4.2 oz/week    4 Glasses of wine, 3 Cans of beer per week     Comment: Social  . Drug Use: No  . Sexual Activity: Not on file   Other Topics Concern  . Not on file   Social History Narrative   Lives alone.         Medication List       This list is accurate as of: 09/19/14  6:27 PM.  Always use your most recent med list.               atorvastatin 40 MG tablet  Commonly known as:  LIPITOR  Take 40 mg by mouth daily.     azelastine 0.1 % nasal spray  Commonly known as:  ASTELIN  Place 2 sprays into both nostrils at bedtime as needed for rhinitis.     celecoxib 100 MG capsule  Commonly known as:  CELEBREX  Take 1 capsule (100 mg total) by mouth 2 (two)  times daily.     Efinaconazole 10 % Soln  Commonly known as:  JUBLIA  Apply daily to toe nail x 48 weeks total     hydrocortisone 25 MG suppository  Commonly known as:  ANUSOL-HC  PLACE 1 SUPPOSITORY (25 MG TOTAL) RECTALLY DAILY AS NEEDED FOR HEMORRHOIDS OR ITCHING.     methocarbamol 500 MG tablet  Commonly known as:  ROBAXIN  1 tab po q hs     metoprolol succinate 25 MG 24 hr tablet  Commonly known as:  TOPROL-XL  TAKE 1 TABLET BY MOUTH DAILY     multivitamin capsule  Take 1 capsule by mouth daily.     omeprazole 20 MG capsule  Commonly known as:  PRILOSEC  TAKE ONE CAPSULE BY MOUTH EVERY DAY     traMADol 50 MG tablet  Commonly known as:  ULTRAM  Take 50 mg by mouth as needed.           Objective:   Physical Exam BP 132/78 mmHg  Pulse 75  Temp(Src) 97.6 F (36.4 C) (Oral)  Ht 5\' 9"  (1.753 m)  Wt 210 lb 2 oz (95.312 kg)  BMI 31.02 kg/m2  SpO2 95%  General:   Well developed, well nourished . NAD.  HEENT:  Normocephalic . Face symmetric, atraumatic Lungs:  CTA B Normal respiratory effort, no intercostal retractions, no accessory muscle use. Heart: RRR,  no murmur.   Muscle skeletal: no pretibial edema bilaterally  Skin: Not pale. Not jaundice Neurologic:  alert & oriented X3.  Speech normal, gait appropriate for age and unassisted Psych--  Cognition and judgment appear intact.  Cooperative with normal attention span and concentration.  Behavior appropriate. No anxious or depressed appearing.       Assessment & Plan:

## 2014-10-23 ENCOUNTER — Other Ambulatory Visit: Payer: Self-pay | Admitting: *Deleted

## 2014-10-23 MED ORDER — METOPROLOL SUCCINATE ER 25 MG PO TB24: 25.0000 mg | ORAL_TABLET | Freq: Every day | ORAL | Status: DC

## 2014-10-24 ENCOUNTER — Other Ambulatory Visit: Payer: Self-pay | Admitting: Cardiovascular Disease

## 2014-10-25 ENCOUNTER — Other Ambulatory Visit: Payer: Self-pay

## 2014-10-30 ENCOUNTER — Other Ambulatory Visit: Payer: Self-pay | Admitting: Medical

## 2014-10-31 ENCOUNTER — Other Ambulatory Visit: Payer: Self-pay

## 2014-10-31 NOTE — Telephone Encounter (Signed)
I filled this celebrex yesterday.

## 2014-12-03 ENCOUNTER — Other Ambulatory Visit: Payer: Self-pay | Admitting: Internal Medicine

## 2014-12-28 ENCOUNTER — Telehealth: Payer: Self-pay | Admitting: Internal Medicine

## 2014-12-28 ENCOUNTER — Other Ambulatory Visit: Payer: Self-pay | Admitting: Internal Medicine

## 2014-12-28 MED ORDER — AMOXICILLIN 500 MG PO CAPS: 2000.0000 mg | ORAL_CAPSULE | Freq: Once | ORAL | Status: DC

## 2014-12-28 NOTE — Telephone Encounter (Signed)
Caller name: Thorsten Climer Relationship to patient: self Can be reached: [phone] Pharmacy:  Reason for call: Pt has dental appt today at 10:40am. He states that he had a knee replacement and is to take the amoxicillin every dental procedure. Dr. Linna Darner used to fill for him any time needed. He is upset that the refill was denied. Please contact ASAP. This is for 500 mg capsule. He is to take 4 of them (2000mg ) before a procedure. Pt is also wondering if that is a correct dose.

## 2014-12-28 NOTE — Telephone Encounter (Signed)
Rx was originally denied as med was no longer on medication list and has not been refilled since 2014, and was unsure of reason for antibiotic. Please inform Pt, [facility]

## 2014-12-28 NOTE — Telephone Encounter (Signed)
Left msg for pt that RX was sent in for him.

## 2015-01-03 ENCOUNTER — Other Ambulatory Visit: Payer: Self-pay | Admitting: Medical

## 2015-01-04 NOTE — Telephone Encounter (Signed)
Left message for patient to call back regarding status.

## 2015-01-09 ENCOUNTER — Encounter: Payer: Self-pay | Admitting: Internal Medicine

## 2015-01-30 ENCOUNTER — Other Ambulatory Visit: Payer: Self-pay | Admitting: Internal Medicine

## 2015-01-30 NOTE — Telephone Encounter (Signed)
Okay #12, no refills

## 2015-01-30 NOTE — Telephone Encounter (Signed)
Pt is requesting refill on Hydrocortisone suppositories.   Last OV: 09/19/2014 Last Fill: 11/21/2013 #12 0RF  Okay to refill?

## 2015-01-30 NOTE — Telephone Encounter (Signed)
Rx sent to pharmacy   

## 2015-02-08 ENCOUNTER — Telehealth: Payer: Self-pay | Admitting: Internal Medicine

## 2015-02-08 MED ORDER — HYDROCORTISONE ACETATE 25 MG RE SUPP: 25.0000 mg | Freq: Two times a day (BID) | RECTAL | Status: DC | PRN

## 2015-02-08 MED ORDER — LIDOCAINE 5 % EX OINT: 1.0000 "application " | TOPICAL_OINTMENT | CUTANEOUS | Status: DC | PRN

## 2015-02-08 NOTE — Telephone Encounter (Signed)
Please advise 

## 2015-02-08 NOTE — Telephone Encounter (Signed)
°  Relation to pt: self  Call back number: [phone]     Reason for call:  Patient states hydrocortisone (ANUSOL-HC) 25 MG suppository is not working, still exper. hemorrhoid pain. Patient would like to speak with medical assistant directly.

## 2015-02-08 NOTE — Telephone Encounter (Signed)
Spoke with Pt, he informed me that he is not having pain with hemorrhoids but mild bleeding. He was requesting to increase Hydrocortisone suppositories twice daily as the package insert suggests. I informed him he can use up to 2 daily PRN but will need to be seen if not improving in several days. Pt verbalized understanding and is requesting refills due to he is flying to [facility] next week and is wanting to have some on hand. I [facility] but to call in several days if not better. Pt verbalized understanding.

## 2015-02-08 NOTE — Telephone Encounter (Signed)
Reports painful hemorrhoids, chart reviewed: Had a colonoscopy 2007, found to have internal hemorrhoids. Plan: Needs to be seen particularly if he has severe pain or bleeding In the meantime I send a prescription for lidocaine to be use as needed.

## 2015-02-15 ENCOUNTER — Encounter: Payer: Medicare Other | Admitting: Internal Medicine

## 2015-02-20 ENCOUNTER — Other Ambulatory Visit: Payer: Self-pay | Admitting: Internal Medicine

## 2015-02-22 ENCOUNTER — Other Ambulatory Visit: Payer: Self-pay | Admitting: Internal Medicine

## 2015-02-22 NOTE — Telephone Encounter (Signed)
Pt called again today after pharmacy received 2 denied refill requests from me. Pt received #24 suppositories on 02/08/2015 and has used them, and needing refill. Pt can not be seen until Tuesday, February 27, 2015 since he can not get off of work. Pt informed he must be seen after his trip to [place] if he still has problems, Pt agreed. Pt now wanting refill without office visit. Pt informed he must either be seen by PCP or go to urgent care or ED.

## 2015-02-23 ENCOUNTER — Ambulatory Visit (INDEPENDENT_AMBULATORY_CARE_PROVIDER_SITE_OTHER): Payer: Medicare Other | Admitting: Internal Medicine

## 2015-02-23 ENCOUNTER — Encounter: Payer: Self-pay | Admitting: Internal Medicine

## 2015-02-23 VITALS — BP 126/78 | HR 67 | Temp 98.3°F | Ht 69.0 in | Wt 202.2 lb

## 2015-02-23 DIAGNOSIS — K648 Other hemorrhoids: Secondary | ICD-10-CM

## 2015-02-23 MED ORDER — HYDROCORTISONE ACETATE 25 MG RE SUPP: 25.0000 mg | Freq: Every day | RECTAL | Status: DC | PRN

## 2015-02-23 NOTE — Progress Notes (Signed)
Subjective:    Patient ID: Benjamin Adkins, male    DOB: [birth year], 74 y.o.   MRN: [id number]  DOS:  02/23/2015 Type of visit - description : Acute Interval history: Symptoms started approximately 2 weeks ago with red, fresh blood per rectum, few drops in the toilet water and also in the toilet paper. Stools are not mixed w/ blood Symptoms started after episodes of constipation. Denies any rectal itching or pain. Similar episodes before also related to constipation.Marland Kitchen He is using suppositories and today for the first time is better  Review of Systems  No fever chills or weight loss No nausea, vomiting, diarrhea or abdominal pain.  Past Medical History  Diagnosis Date  . Hyperlipidemia     nmr lipoprofile 2004: LDL 154 (1653/711), HDL 42, TG 80.NMR  LDL =,125, but PMH  of MI LDL goal=,70  . HTN (hypertension)   . Coronary atherosclerosis of unspecified type of vessel, native or graft     MI 2006, h/o CABG  . Personal history of colonic polyps   . DJD (degenerative joint disease)   . Elevated prostate specific antigen (PSA)   . Other cataract   . Esophageal reflux   . Sleep apnea     uses CPAP  . Shortness of breath     DOE    Past Surgical History  Procedure Laterality Date  . Cataract extraction    . Colonoscopy w/ polypectomy    . Inguinal hernia repair    . Total knee arthroplasty  2005  . Coronary artery bypass graft  2006    X 4   . Cardiac catheterization      2006  . Hernia repair      VENTRAL, LEFT & RIGHT GROIN  . Total knee arthroplasty      2006 (before MI)  . Total knee arthroplasty  08/25/2011    Procedure: TOTAL KNEE ARTHROPLASTY;  Surgeon: Lorn Junes, MD;  Location: [facility];  Service: Orthopedics;  Laterality: Right;  DR Noemi Chapel WANTS 56 MINUTES FOR SURGERY    History   Social History  . Marital Status: Widowed    Spouse Name: N/A  . Number of Children: 1  . Years of Education: N/A   Occupational History  . retired    Social History Main  Topics  . Smoking status: Former Smoker -- 0.25 packs/day for 3 years    Types: Cigarettes    Start date: 04/12/1959    Quit date: 08/12/1961  . Smokeless tobacco: Never Used     Comment: during college  . Alcohol Use: 4.2 oz/week    4 Glasses of wine, 3 Cans of beer per week     Comment: Social  . Drug Use: No  . Sexual Activity: Not on file   Other Topics Concern  . Not on file   Social History Narrative   Lives alone.         Medication List       This list is accurate as of: 02/23/15  5:38 PM.  Always use your most recent med list.               amoxicillin 500 MG capsule  Commonly known as:  AMOXIL  Take 4 capsules (2,000 mg total) by mouth once. For dental procedure.     atorvastatin 40 MG tablet  Commonly known as:  LIPITOR  Take 40 mg by mouth daily.     azelastine 0.1 % nasal spray  Commonly known  as:  ASTELIN  Place 2 sprays into both nostrils at bedtime as needed for rhinitis.     celecoxib 100 MG capsule  Commonly known as:  CELEBREX  TAKE ONE CAPSULE BY MOUTH TWICE A DAY     Efinaconazole 10 % Soln  Commonly known as:  JUBLIA  Apply daily to toe nail x 48 weeks total     hydrocortisone 25 MG suppository  Commonly known as:  ANUSOL-HC  Place 1 suppository (25 mg total) rectally daily as needed for hemorrhoids or itching.     lidocaine 5 % ointment  Commonly known as:  XYLOCAINE  Apply 1 application topically as needed.     methocarbamol 500 MG tablet  Commonly known as:  ROBAXIN  1 tab po q hs     metoprolol succinate 25 MG 24 hr tablet  Commonly known as:  TOPROL-XL  TAKE 1 TABLET BY MOUTH DAILY     multivitamin capsule  Take 1 capsule by mouth daily.     omeprazole 20 MG capsule  Commonly known as:  PRILOSEC  Take 1 capsule (20 mg total) by mouth daily.     STOOL SOFTENER PO  Take 1 tablet by mouth daily.     traMADol 50 MG tablet  Commonly known as:  ULTRAM  Take 50 mg by mouth as needed.           Objective:    Physical Exam BP 126/78 mmHg  Pulse 67  Temp(Src) 98.3 F (36.8 C) (Oral)  Ht 5\' 9"  (1.753 m)  Wt 202 lb 4 oz (91.74 kg)  BMI 29.85 kg/m2  SpO2 97% General:   Well developed, well nourished . NAD.  HEENT:  Normocephalic . Face symmetric, atraumatic Abdomen:  Not distended, soft, non-tender. No rebound or rigidity.  Anoscopy: External examination: 1 skin tag DRE with no mass Anoscopy + several small internal hemorrhoids a couple of them look slightly irritated. Neurologic:  alert & oriented X3.  Speech normal, gait appropriate for age and unassisted Psych--  Cognition and judgment appear intact.  Cooperative with normal attention span and concentration.  Behavior appropriate. No anxious or depressed appearing.    Assessment & Plan:

## 2015-02-23 NOTE — Progress Notes (Signed)
Pre visit review using our clinic review tool, if applicable. No additional management support is needed unless otherwise documented below in the visit note. 

## 2015-02-23 NOTE — Assessment & Plan Note (Signed)
Symptoms likely due to internal hemorrhoids. No red flags. Colonoscopy in 2007 showed  internal hemorrhoids. Plan: Prevent constipation with Metamucil and drinking fluids Continue to using suppositories as needed If not gradually improving let me know, also call if symptoms worsen. We'll recommend a colonoscopy next year for screening.

## 2015-02-23 NOTE — Patient Instructions (Addendum)
Come back in 2-3 months for your physical.    Please call if symptoms worsen or go to the urgent care/ER.

## 2015-02-28 ENCOUNTER — Encounter: Payer: Self-pay | Admitting: Internal Medicine

## 2015-03-29 ENCOUNTER — Other Ambulatory Visit: Payer: Self-pay | Admitting: Medical

## 2015-03-30 NOTE — Telephone Encounter (Signed)
It has been six months since I saw him for trapezius pain. Now getting refill request for celebrex. Can refill but advise pt to come in  before he runs out. See Dr. Larose Kells or myself. Assess status of trapezius pain. Could refer to Sports medicine or PT. If he is having pain somewhere else then advise be seen sooner like within next 1-2 wks.

## 2015-04-23 ENCOUNTER — Ambulatory Visit (INDEPENDENT_AMBULATORY_CARE_PROVIDER_SITE_OTHER): Payer: Medicare Other | Admitting: Internal Medicine

## 2015-04-23 ENCOUNTER — Encounter: Payer: Self-pay | Admitting: Internal Medicine

## 2015-04-23 VITALS — BP 124/72 | HR 60 | Temp 97.9°F | Ht 69.0 in | Wt 199.5 lb

## 2015-04-23 DIAGNOSIS — Z0000 Encounter for general adult medical examination without abnormal findings: Secondary | ICD-10-CM

## 2015-04-23 DIAGNOSIS — Z23 Encounter for immunization: Secondary | ICD-10-CM | POA: Diagnosis not present

## 2015-04-23 DIAGNOSIS — I10 Essential (primary) hypertension: Secondary | ICD-10-CM

## 2015-04-23 DIAGNOSIS — Z09 Encounter for follow-up examination after completed treatment for conditions other than malignant neoplasm: Secondary | ICD-10-CM

## 2015-04-23 DIAGNOSIS — R7303 Prediabetes: Secondary | ICD-10-CM

## 2015-04-23 DIAGNOSIS — R972 Elevated prostate specific antigen [PSA]: Secondary | ICD-10-CM

## 2015-04-23 DIAGNOSIS — E785 Hyperlipidemia, unspecified: Secondary | ICD-10-CM

## 2015-04-23 DIAGNOSIS — L989 Disorder of the skin and subcutaneous tissue, unspecified: Secondary | ICD-10-CM

## 2015-04-23 MED ORDER — METOPROLOL SUCCINATE ER 25 MG PO TB24: 25.0000 mg | ORAL_TABLET | Freq: Every day | ORAL | Status: DC

## 2015-04-23 NOTE — Assessment & Plan Note (Signed)
Tdap: 08/2103 ; PNA: 07/2013 ; prevnar--04-2015; shingles:06/2011   Flu shot today CCS: 02/2006 Diverticulosis, internal hemorrhoids-->10 years  PSA: See comments under elevated PSA Cont healthy lifestyle

## 2015-04-23 NOTE — Patient Instructions (Addendum)
Please schedule labs to be done within few days (fasting)   Next visit in 6-8 months for a routine checkup, no fasting. Please make an appointment    Fall Prevention and Home Safety Falls cause injuries and can affect all age groups. It is possible to use preventive measures to significantly decrease the likelihood of falls. There are many simple measures which can make your home safer and prevent falls. OUTDOORS  Repair cracks and edges of walkways and driveways.  Remove high doorway thresholds.  Trim shrubbery on the main path into your home.  Have good outside lighting.  Clear walkways of tools, rocks, debris, and clutter.  Check that handrails are not broken and are securely fastened. Both sides of steps should have handrails.  Have leaves, snow, and ice cleared regularly.  Use sand or salt on walkways during winter months.  In the garage, clean up grease or oil spills. BATHROOM  Install night lights.  Install grab bars by the toilet and in the tub and shower.  Use non-skid mats or decals in the tub or shower.  Place a plastic non-slip stool in the shower to sit on, if needed.  Keep floors dry and clean up all water on the floor immediately.  Remove soap buildup in the tub or shower on a regular basis.  Secure bath mats with non-slip, double-sided rug tape.  Remove throw rugs and tripping hazards from the floors. BEDROOMS  Install night lights.  Make sure a bedside light is easy to reach.  Do not use oversized bedding.  Keep a telephone by your bedside.  Have a firm chair with side arms to use for getting dressed.  Remove throw rugs and tripping hazards from the floor. KITCHEN  Keep handles on pots and pans turned toward the center of the stove. Use back burners when possible.  Clean up spills quickly and allow time for drying.  Avoid walking on wet floors.  Avoid hot utensils and knives.  Position shelves so they are not too high or  low.  Place commonly used objects within easy reach.  If necessary, use a sturdy step stool with a grab bar when reaching.  Keep electrical cables out of the way.  Do not use floor polish or wax that makes floors slippery. If you must use wax, use non-skid floor wax.  Remove throw rugs and tripping hazards from the floor. STAIRWAYS  Never leave objects on stairs.  Place handrails on both sides of stairways and use them. Fix any loose handrails. Make sure handrails on both sides of the stairways are as long as the stairs.  Check carpeting to make sure it is firmly attached along stairs. Make repairs to worn or loose carpet promptly.  Avoid placing throw rugs at the top or bottom of stairways, or properly secure the rug with carpet tape to prevent slippage. Get rid of throw rugs, if possible.  Have an electrician put in a light switch at the top and bottom of the stairs. OTHER FALL PREVENTION TIPS  Wear low-heel or rubber-soled shoes that are supportive and fit well. Wear closed toe shoes.  When using a stepladder, make sure it is fully opened and both spreaders are firmly locked. Do not climb a closed stepladder.  Add color or contrast paint or tape to grab bars and handrails in your home. Place contrasting color strips on first and last steps.  Learn and use mobility aids as needed. Install an electrical emergency response system.  Turn on  lights to avoid dark areas. Replace light bulbs that burn out immediately. Get light switches that glow.  Arrange furniture to create clear pathways. Keep furniture in the same place.  Firmly attach carpet with non-skid or double-sided tape.  Eliminate uneven floor surfaces.  Select a carpet pattern that does not visually hide the edge of steps.  Be aware of all pets. OTHER HOME SAFETY TIPS  Set the water temperature for 120 F (48.8 C).  Keep emergency numbers on or near the telephone.  Keep smoke detectors on every level of the  home and near sleeping areas. Document Released: 06/27/2002 Document Revised: 01/06/2012 Document Reviewed: 09/26/2011 [facility] Patient Information 2015 [place], [place]. This information is not intended to replace advice given to you by your health care provider. Make sure you discuss any questions you have with your health care provider.   Preventive Care for Adults Ages 71 and over  Blood pressure check.** / Every 1 to 2 years.  Lipid and cholesterol check.**/ Every 5 years beginning at age 30.  Lung cancer screening. / Every year if you are aged 81-80 years and have a 30-pack-year history of smoking and currently smoke or have quit within the past 15 years. Yearly screening is stopped once you have quit smoking for at least 15 years or develop a health problem that would prevent you from having lung cancer treatment.  Fecal occult blood test (FOBT) of stool. / Every year beginning at age 110 and continuing until age 30. You may not have to do this test if you get a colonoscopy every 10 years.  Flexible sigmoidoscopy** or colonoscopy.** / Every 5 years for a flexible sigmoidoscopy or every 10 years for a colonoscopy beginning at age 8 and continuing until age 38.  Hepatitis C blood test.** / For all people born from [birth year] through 1965 and any individual with known risks for hepatitis C.  Abdominal aortic aneurysm (AAA) screening.** / A one-time screening for ages 42 to 46 years who are current or former smokers.  Skin self-exam. / Monthly.  Influenza vaccine. / Every year.  Tetanus, diphtheria, and acellular pertussis (Tdap/Td) vaccine.** / 1 dose of Td every 10 years.  Varicella vaccine.** / Consult your health care provider.  Zoster vaccine.** / 1 dose for adults aged 13 years or older.  Pneumococcal 13-valent conjugate (PCV13) vaccine.** / Consult your health care provider.  Pneumococcal polysaccharide (PPSV23) vaccine.** / 1 dose for all adults aged 49 years and  older.  Meningococcal vaccine.** / Consult your health care provider.  Hepatitis A vaccine.** / Consult your health care provider.  Hepatitis B vaccine.** / Consult your health care provider.  Haemophilus influenzae type b (Hib) vaccine.** / Consult your health care provider. **Family history and personal history of risk and conditions may change your health care provider's recommendations. Document Released: 09/02/2001 Document Revised: 07/12/2013 Document Reviewed: 12/02/2010 [facility] Patient Information 2015 [place], [place]. This information is not intended to replace advice given to you by your health care provider. Make sure you discuss any questions you have with your health care provider.

## 2015-04-23 NOTE — Progress Notes (Signed)
Pre visit review using our clinic review tool, if applicable. No additional management support is needed unless otherwise documented below in the visit note. 

## 2015-04-23 NOTE — Progress Notes (Signed)
Subjective:    Patient ID: Benjamin Adkins, male    DOB: 11-09-1940, 74 y.o.   MRN: [id number]  DOS:  04/23/2015  Type of visit - description: Here for Medicare AWV: 1. Risk factors based on Past M, S, F history: reviewed 2. Physical Activities:  Golf, yard work, stationary bike, gym 3. Depression/mood: neg screening   4. Hearing:  No problemss noted or reported   5. ADL's:  Independent   6. Fall Risk: no recent fall, precautions discussed   7. home Safety: does feel safe at home   8. Height, weight, & visual acuity: see VS, vision normal, does have regular checkups, s/p surgery for  catarct B 9. Counseling: provided 10. Labs ordered based on risk factors: if needed   11. Referral Coordination: if needed 12. Care Plan, see assessment and plan   13. Cognitive Assessment: Motor skills and cognition seems to be > average for  age 60. List of providers updated 15. End of life care: Has a HC POA (daughter)  In addition, today we discussed the following: CAD-- He is active, asymptomatic, on on appropriate medications HTN: BP today is very good, on beta blockers.  Review of Systems  Constitutional: No fever. No chills. No unexplained wt changes. No unusual sweats  HEENT: No dental problems, no ear discharge, no facial swelling, no voice changes. No eye discharge, no eye  redness , no  intolerance to light   Respiratory: No wheezing , no  difficulty breathing. No cough , no mucus production  Cardiovascular: No CP, no leg swelling , no  Palpitations  GI: no nausea, no vomiting, no diarrhea , no  abdominal pain.  No blood in the stools. No dysphagia, no odynophagia    Endocrine: No polyphagia, no polyuria , no polydipsia  GU: No dysuria, gross hematuria, difficulty urinating. No urinary urgency, no frequency.  Musculoskeletal: No joint swellings or unusual aches or pains  Skin: Has a number of different skin lesions, likes to see a dermatologist  Allergic, immunologic: No  environmental allergies , no  food allergies  Neurological: No dizziness no  syncope. No headaches. No diplopia, no slurred, no slurred speech, no motor deficits, no facial  Numbness  Hematological: No enlarged lymph nodes, no easy bruising , no unusual bleedings  Psychiatry: No suicidal ideas, no hallucinations, no beavior problems, no confusion.  No unusual/severe anxiety, no depression   Past Medical History  Diagnosis Date  . Hyperlipidemia     nmr lipoprofile 2004: LDL 154 (1653/711), HDL 42, TG 80.NMR  LDL =,125, but PMH  of MI LDL goal=,70  . HTN (hypertension)   . Coronary atherosclerosis of unspecified type of vessel, native or graft     MI 2006, h/o CABG  . Personal history of colonic polyps   . DJD (degenerative joint disease)   . Elevated prostate specific antigen (PSA)   . Other cataract   . Esophageal reflux   . Sleep apnea     uses CPAP  . Shortness of breath     DOE    Past Surgical History  Procedure Laterality Date  . Cataract extraction Bilateral   . Colonoscopy w/ polypectomy    . Inguinal hernia repair    . Total knee arthroplasty  2005  . Coronary artery bypass graft  2006    X 4   . Cardiac catheterization      2006  . Hernia repair      VENTRAL, LEFT & RIGHT GROIN  .  Total knee arthroplasty      2006 (before MI)  . Total knee arthroplasty  08/25/2011    Procedure: TOTAL KNEE ARTHROPLASTY;  Surgeon: Lorn Junes, MD;  Location: [facility];  Service: Orthopedics;  Laterality: Right;  DR Wren    Social History   Social History  . Marital Status: Widowed    Spouse Name: N/A  . Number of Children: 1  . Years of Education: N/A   Occupational History  . retired    Social History Main Topics  . Smoking status: Former Smoker -- 0.25 packs/day for 3 years    Types: Cigarettes    Start date: 04/12/1959    Quit date: 08/12/1961  . Smokeless tobacco: Never Used     Comment: during college  . Alcohol Use: 4.2 oz/week      4 Glasses of wine, 3 Cans of beer per week     Comment: Social  . Drug Use: No  . Sexual Activity: Not on file   Other Topics Concern  . Not on file   Social History Narrative   Lives alone.      Family History  Problem Relation Age of Onset  . Cancer Father     stomach  . Stomach cancer Father   . Hypertension Mother   . Breast cancer Mother   . Coronary artery disease Mother   . Heart disease Mother   . Breast cancer Sister   . Crohn's disease Sister   . Arthritis Sister   . Heart attack Paternal Grandfather 82  . Arthritis Brother   . Colon cancer Neg Hx   . Prostate cancer Neg Hx        Medication List       This list is accurate as of: 04/23/15 11:59 PM.  Always use your most recent med list.               amoxicillin 500 MG capsule  Commonly known as:  AMOXIL  Take 4 capsules (2,000 mg total) by mouth once. For dental procedure.     aspirin 325 MG tablet  Take 325 mg by mouth daily.     atorvastatin 40 MG tablet  Commonly known as:  LIPITOR  Take 20 mg by mouth daily.     azelastine 0.1 % nasal spray  Commonly known as:  ASTELIN  Place 2 sprays into both nostrils at bedtime as needed for rhinitis.     celecoxib 100 MG capsule  Commonly known as:  CELEBREX  TAKE ONE CAPSULE BY MOUTH TWICE A DAY     Efinaconazole 10 % Soln  Commonly known as:  JUBLIA  Apply daily to toe nail x 48 weeks total     hydrocortisone 25 MG suppository  Commonly known as:  ANUSOL-HC  Place 1 suppository (25 mg total) rectally daily as needed for hemorrhoids or itching.     metoprolol succinate 25 MG 24 hr tablet  Commonly known as:  TOPROL-XL  Take 1 tablet (25 mg total) by mouth daily.     multivitamin capsule  Take 1 capsule by mouth daily.     omeprazole 20 MG capsule  Commonly known as:  PRILOSEC  Take 1 capsule (20 mg total) by mouth daily.     STOOL SOFTENER PO  Take 1 tablet by mouth daily.     traMADol 50 MG tablet  Commonly known as:  ULTRAM   Take 50 mg by mouth as needed.  Objective:   Physical Exam BP 124/72 mmHg  Pulse 60  Temp(Src) 97.9 F (36.6 C) (Oral)  Ht 5\' 9"  (1.753 m)  Wt 199 lb 8 oz (90.493 kg)  BMI 29.45 kg/m2  SpO2 96% General:   Well developed, well nourished . NAD.  HEENT:  Normocephalic . Face symmetric, atraumatic Lungs:  CTA B Normal respiratory effort, no intercostal retractions, no accessory muscle use. Heart: RRR,  no murmur.  no pretibial edema bilaterally  Abdomen:  Not distended, soft, non-tender. No rebound or rigidity.  Skin: Not pale. Not jaundice. He has a variety of skin lesions, the one his concerned is skin-colored, papular, 1 mm lesion. Neurologic:  alert & oriented X3.  Speech normal, gait appropriate for age and unassisted Psych--  Cognition and judgment appear intact.  Cooperative with normal attention span and concentration.  Behavior appropriate. No anxious or depressed appearing.    Assessment & Plan:   Assessment > Prediabetes HTN Hyperlipidemia CAD, MI 2006, CABG, RBBB OSA , on CPAP DJD Elevated PSA  Plan: HTN: Continue beta blockers, check a CMP Hyperlipidemia: Continue Lipitor, check FLP, AST, ALT Hemorrhoids: Currently asymptomatic. Elevated PSA:   01-2014   PSA was a slightly elevated, DRE (-) ; PSA subsequently decreased. Recheck today Skin lesions: Arrange a dermatology referral

## 2015-04-24 DIAGNOSIS — Z09 Encounter for follow-up examination after completed treatment for conditions other than malignant neoplasm: Secondary | ICD-10-CM | POA: Insufficient documentation

## 2015-04-24 NOTE — Assessment & Plan Note (Signed)
HTN: Continue beta blockers, check a CMP Hyperlipidemia: Continue Lipitor, check FLP, AST, ALT Hemorrhoids: Currently asymptomatic. Elevated PSA:   01-2014   PSA was a slightly elevated, DRE (-) ; PSA subsequently decreased. Recheck today Skin lesions: Arrange a dermatology referral

## 2015-04-25 ENCOUNTER — Other Ambulatory Visit (INDEPENDENT_AMBULATORY_CARE_PROVIDER_SITE_OTHER): Payer: Medicare Other

## 2015-04-25 DIAGNOSIS — R7303 Prediabetes: Secondary | ICD-10-CM

## 2015-04-25 DIAGNOSIS — I10 Essential (primary) hypertension: Secondary | ICD-10-CM | POA: Diagnosis not present

## 2015-04-25 DIAGNOSIS — E785 Hyperlipidemia, unspecified: Secondary | ICD-10-CM | POA: Diagnosis not present

## 2015-04-25 LAB — CBC WITH DIFFERENTIAL/PLATELET
BASOS ABS: 0 10*3/uL (ref 0.0–0.1)
Basophils Relative: 0.2 % (ref 0.0–3.0)
EOS ABS: 0.1 10*3/uL (ref 0.0–0.7)
Eosinophils Relative: 2.4 % (ref 0.0–5.0)
HEMATOCRIT: 43.9 % (ref 39.0–52.0)
HEMOGLOBIN: 15.2 g/dL (ref 13.0–17.0)
LYMPHS PCT: 29.1 % (ref 12.0–46.0)
Lymphs Abs: 1.6 10*3/uL (ref 0.7–4.0)
MCHC: 34.6 g/dL (ref 30.0–36.0)
MCV: 91.9 fl (ref 78.0–100.0)
MONO ABS: 0.5 10*3/uL (ref 0.1–1.0)
Monocytes Relative: 8.8 % (ref 3.0–12.0)
Neutro Abs: 3.4 10*3/uL (ref 1.4–7.7)
Neutrophils Relative %: 59.5 % (ref 43.0–77.0)
PLATELETS: 169 10*3/uL (ref 150.0–400.0)
RBC: 4.78 Mil/uL (ref 4.22–5.81)
RDW: 13.6 % (ref 11.5–15.5)
WBC: 5.6 10*3/uL (ref 4.0–10.5)

## 2015-04-25 LAB — COMPREHENSIVE METABOLIC PANEL
ALT: 21 U/L (ref 0–53)
AST: 33 U/L (ref 0–37)
Albumin: 4.3 g/dL (ref 3.5–5.2)
Alkaline Phosphatase: 71 U/L (ref 39–117)
BUN: 17 mg/dL (ref 6–23)
CHLORIDE: 103 meq/L (ref 96–112)
CO2: 30 meq/L (ref 19–32)
CREATININE: 1.02 mg/dL (ref 0.40–1.50)
Calcium: 9.5 mg/dL (ref 8.4–10.5)
GFR: 75.89 mL/min (ref >60.00)
GLUCOSE: 111 mg/dL — AB (ref 70–99)
POTASSIUM: 4.6 meq/L (ref 3.5–5.1)
SODIUM: 139 meq/L (ref 135–145)
Total Bilirubin: 1 mg/dL (ref 0.2–1.2)
Total Protein: 7.3 g/dL (ref 6.0–8.3)

## 2015-04-25 LAB — LIPID PANEL
CHOL/HDL RATIO: 3
Cholesterol: 153 mg/dL (ref 0–200)
HDL: 45.6 mg/dL (ref >39.00)
LDL CALC: 89 mg/dL (ref 0–99)
NonHDL: 107.02
Triglycerides: 91 mg/dL (ref 0.0–149.0)
VLDL: 18.2 mg/dL (ref 0.0–40.0)

## 2015-04-25 LAB — HEMOGLOBIN A1C: HEMOGLOBIN A1C: 5.5 % (ref 4.6–6.5)

## 2015-05-21 ENCOUNTER — Encounter: Payer: Self-pay | Admitting: Internal Medicine

## 2015-05-27 NOTE — Progress Notes (Signed)
Patient ID: Benjamin Adkins, male   DOB: 02-06-1941, 74 y.o.   MRN: [id number] This is a 74 y.o.  white male patient f/U CAD  In 2005 he had a perioperative MI after left knee replacement. He required urgent stenting to the RCA and subsequent CABG. He did have a negative treadmill prior to his knee surgery in 2005. He has done well since that time. He has not had any stress test or echo since then. He has a history of a right bundle branch block.his lipids are controlled. He has no history of diabetes and does not smoke.   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  All other systems reviewed and negative  General: Affect appropriate Healthy:  appears stated age 74: normal Neck supple with no adenopathy JVP normal no bruits no thyromegaly Lungs clear with no wheezing and good diaphragmatic motion Heart:  S1/S2 no murmur, no rub, gallop or click PMI normal Abdomen: benighn, BS positve, no tenderness, no AAA no bruit.  No HSM or HJR Distal pulses intact with no bruits  S/p bilateral TKR;s  No edema Neuro non-focal Skin warm and dry No muscular weakness   Current Outpatient Prescriptions  Medication Sig Dispense Refill  . amoxicillin (AMOXIL) 500 MG capsule Take four (4) capsules by mouth as needed for dental procedures.    Marland Kitchen aspirin 325 MG tablet Take 325 mg by mouth daily.    Marland Kitchen atorvastatin (LIPITOR) 40 MG tablet Take 20 mg by mouth daily.     Marland Kitchen azelastine (ASTELIN) 137 MCG/SPRAY nasal spray Place 2 sprays into both nostrils at bedtime as needed for  rhinitis. 30 mL 0  . celecoxib (CELEBREX) 200 MG capsule Take 200 mg by mouth 2 (two) times daily as needed (joint pain).    . DOCUSATE SODIUM PO Take 1 capsule by mouth daily as needed (constipation).    . Efinaconazole (JUBLIA) 10 % SOLN Apply daily to toe nail x 48 weeks total 8 mL 6  . metoprolol succinate (TOPROL-XL) 25 MG 24 hr tablet Take 1 tablet (25 mg total) by mouth daily. 90 tablet 3  . Multiple Vitamin (MULTIVITAMIN) capsule Take 1 capsule by mouth daily.      Marland Kitchen omeprazole (PRILOSEC) 20 MG capsule Take 1 capsule (20 mg total) by mouth daily. 90 capsule 1   No current facility-administered medications for this visit.    Allergies  Review of patient's allergies indicates no known allergies.  Electrocardiogram: SR RBBB LAFB  Old IMI  2013  Today NSR RBBB LAFB  Old IMI no change  05/29/15 SR RBBB old IMI no change  Assessment and Plan CAD:  Stable with no angina and good activity level.  Continue medical Rx Chol:  On statin labs  [facility]  Lab Results  Component Value Date   LDLCALC 89 04/25/2015   GERD:  On prilosec discussed low carb diet    F/U with me in a year  Jenkins Rouge

## 2015-05-29 ENCOUNTER — Other Ambulatory Visit: Payer: Self-pay | Admitting: Internal Medicine

## 2015-05-29 ENCOUNTER — Encounter: Payer: Self-pay | Admitting: Cardiovascular Disease

## 2015-05-29 ENCOUNTER — Ambulatory Visit (INDEPENDENT_AMBULATORY_CARE_PROVIDER_SITE_OTHER): Payer: Medicare Other | Admitting: Cardiovascular Disease

## 2015-05-29 DIAGNOSIS — I2510 Atherosclerotic heart disease of native coronary artery without angina pectoris: Secondary | ICD-10-CM

## 2015-05-29 MED ORDER — NITROGLYCERIN 0.4 MG SL SUBL: 0.4000 mg | SUBLINGUAL_TABLET | SUBLINGUAL | Status: DC | PRN

## 2015-05-29 MED ORDER — METOPROLOL SUCCINATE ER 25 MG PO TB24: 25.0000 mg | ORAL_TABLET | Freq: Every day | ORAL | Status: DC

## 2015-05-29 NOTE — Patient Instructions (Signed)

## 2015-06-06 ENCOUNTER — Other Ambulatory Visit: Payer: Self-pay | Admitting: Medical

## 2015-06-06 ENCOUNTER — Telehealth: Payer: Self-pay | Admitting: Internal Medicine

## 2015-06-06 MED ORDER — CELECOXIB 100 MG PO CAPS: 100.0000 mg | ORAL_CAPSULE | Freq: Two times a day (BID) | ORAL | Status: DC

## 2015-06-06 NOTE — Telephone Encounter (Signed)
Caller name: Self   Can be reached: [phone]  Pharmacy: [id number] - [place], [place] - [address] [phone] (Phone) [phone] (Fax)         Reason for call: Need refill on celecoxib (CELEBREX) 200 MG capsule [id number] Request 90 day supply

## 2015-06-06 NOTE — Telephone Encounter (Signed)
Okay 90 days, no refills

## 2015-06-06 NOTE — Telephone Encounter (Signed)
Pt is requesting refill on Celebrex. Pt requesting 90 day supply.  Last OV: 04/23/2015 Last Fill: 03/30/2015 #30 and 0RF   Okay to refill?

## 2015-06-06 NOTE — Telephone Encounter (Signed)
Rx sent 

## 2015-06-28 ENCOUNTER — Other Ambulatory Visit: Payer: Self-pay | Admitting: Internal Medicine

## 2015-09-10 ENCOUNTER — Other Ambulatory Visit: Payer: Self-pay | Admitting: Internal Medicine

## 2015-09-10 NOTE — Telephone Encounter (Signed)
Rx sent 

## 2015-09-10 NOTE — Telephone Encounter (Signed)
Okay 180, 1 RF

## 2015-09-10 NOTE — Telephone Encounter (Signed)
Pt is requesting refill on Celebrex.   Last OV: 04/23/2015 Last Fill: 06/06/2015 #180 and 0RF Pt sig: 1 tablet BID   Okay to refill?

## 2015-09-19 ENCOUNTER — Telehealth: Payer: Self-pay | Admitting: Internal Medicine

## 2015-09-19 ENCOUNTER — Other Ambulatory Visit: Payer: Self-pay

## 2015-09-19 MED ORDER — AMOXICILLIN 500 MG PO CAPS: ORAL_CAPSULE | ORAL | Status: DC

## 2015-09-19 NOTE — Telephone Encounter (Signed)
Please advise 

## 2015-09-19 NOTE — Telephone Encounter (Signed)
Relation to [id number] Call back number: [phone] Pharmacy: [id number] - [place], [place] [phone] (Phone) [phone] (Fax)         Reason for call:  Patient requesting a year supply amoxicillin (AMOXIL) 500 MG capsule 12 capsules, patient is completely out today and has a dentist appointment today. Please follow up with patient directly

## 2015-09-19 NOTE — Telephone Encounter (Signed)
Spoke w/ Dr. Larose Kells, okay to change to #20 capsules. Rx sent.

## 2015-09-19 NOTE — Telephone Encounter (Signed)
Okay to refill # 12

## 2015-10-16 ENCOUNTER — Encounter: Payer: Self-pay | Admitting: Internal Medicine

## 2015-10-16 DIAGNOSIS — Z1211 Encounter for screening for malignant neoplasm of colon: Secondary | ICD-10-CM

## 2015-10-16 NOTE — Telephone Encounter (Signed)
GI referral placed for repeat cscope.

## 2015-12-18 ENCOUNTER — Other Ambulatory Visit: Payer: Self-pay | Admitting: Family Medicine

## 2015-12-18 ENCOUNTER — Ambulatory Visit (INDEPENDENT_AMBULATORY_CARE_PROVIDER_SITE_OTHER): Payer: Medicare Other | Admitting: Family Medicine

## 2015-12-18 ENCOUNTER — Encounter: Payer: Self-pay | Admitting: Internal Medicine

## 2015-12-18 ENCOUNTER — Encounter: Payer: Self-pay | Admitting: Family Medicine

## 2015-12-18 VITALS — BP 134/76 | HR 67 | Temp 97.8°F | Wt 202.0 lb

## 2015-12-18 DIAGNOSIS — S30860A Insect bite (nonvenomous) of lower back and pelvis, initial encounter: Secondary | ICD-10-CM | POA: Diagnosis not present

## 2015-12-18 DIAGNOSIS — W57XXXA Bitten or stung by nonvenomous insect and other nonvenomous arthropods, initial encounter: Secondary | ICD-10-CM | POA: Diagnosis not present

## 2015-12-18 MED ORDER — DOXYCYCLINE HYCLATE 100 MG PO TABS: 100.0000 mg | ORAL_TABLET | Freq: Two times a day (BID) | ORAL | Status: DC

## 2015-12-18 NOTE — Patient Instructions (Signed)
Tick Bite Information Ticks are insects that attach themselves to the skin and draw blood for food. There are various types of ticks. Common types include wood ticks and deer ticks. Most ticks live in shrubs and grassy areas. Ticks can climb onto your body when you make contact with leaves or grass where the tick is waiting. The most common places on the body for ticks to attach themselves are the scalp, neck, armpits, waist, and groin. Most tick bites are harmless, but sometimes ticks carry germs that cause diseases. These germs can be spread to a person during the tick's feeding process. The chance of a disease spreading through a tick bite depends on:   The type of tick.  Time of year.   How long the tick is attached.   Geographic location.  HOW CAN YOU PREVENT TICK BITES? Take these steps to help prevent tick bites when you are outdoors:  Wear protective clothing. Long sleeves and long pants are best.   Wear white clothes so you can see ticks more easily.  Tuck your pant legs into your socks.   If walking on a trail, stay in the middle of the trail to avoid brushing against bushes.  Avoid walking through areas with long grass.  Put insect repellent on all exposed skin and along boot tops, pant legs, and sleeve cuffs.   Check clothing, hair, and skin repeatedly and before going inside.   Brush off any ticks that are not attached.  Take a shower or bath as soon as possible after being outdoors.  WHAT IS THE PROPER WAY TO REMOVE A TICK? Ticks should be removed as soon as possible to help prevent diseases caused by tick bites. 1. If latex gloves are available, put them on before trying to remove a tick.  2. Using fine-point tweezers, grasp the tick as close to the skin as possible. You may also use curved forceps or a tick removal tool. Grasp the tick as close to its head as possible. Avoid grasping the tick on its body. 3. Pull gently with steady upward pressure until  the tick lets go. Do not twist the tick or jerk it suddenly. This may break off the tick's head or mouth parts. 4. Do not squeeze or crush the tick's body. This could force disease-carrying fluids from the tick into your body.  5. After the tick is removed, wash the bite area and your hands with soap and water or other disinfectant such as alcohol. 6. Apply a small amount of antiseptic cream or ointment to the bite site.  7. Wash and disinfect any instruments that were used.  Do not try to remove a tick by applying a hot match, petroleum jelly, or fingernail polish to the tick. These methods do not work and may increase the chances of disease being spread from the tick bite.  WHEN SHOULD YOU SEEK MEDICAL CARE? Contact your health care provider if you are unable to remove a tick from your skin or if a part of the tick breaks off and is stuck in the skin.  After a tick bite, you need to be aware of signs and symptoms that could be related to diseases spread by ticks. Contact your health care provider if you develop any of the following in the days or weeks after the tick bite:  Unexplained fever.  Rash. A circular rash that appears days or weeks after the tick bite may indicate the possibility of Lyme disease. The rash may resemble   a target with a bull's-eye and may occur at a different part of your body than the tick bite.  Redness and swelling in the area of the tick bite.   Tender, swollen lymph glands.   Diarrhea.   Weight loss.   Cough.   Fatigue.   Muscle, joint, or bone pain.   Abdominal pain.   Headache.   Lethargy or a change in your level of consciousness.  Difficulty walking or moving your legs.   Numbness in the legs.   Paralysis.  Shortness of breath.   Confusion.   Repeated vomiting.    This information is not intended to replace advice given to you by your health care provider. Make sure you discuss any questions you have with your health  care provider.   Document Released: 07/04/2000 Document Revised: 07/28/2014 Document Reviewed: 12/15/2012 Elsevier Interactive Patient Education 2016 Elsevier Inc.  

## 2015-12-18 NOTE — Progress Notes (Signed)
Patient ID: Benjamin Adkins, male    DOB: March 02, 1941  Age: 75 y.o. MRN: [id number]    Subjective:  Subjective HPI GEHRIG SCHIESS presents for + tick bite vs spider bite.  He has been playing golf and has been in the woods.    Review of Systems  Constitutional: Negative for diaphoresis, appetite change, fatigue and unexpected weight change.  Eyes: Negative for pain, redness and visual disturbance.  Respiratory: Negative for cough, chest tightness, shortness of breath and wheezing.   Cardiovascular: Negative for chest pain, palpitations and leg swelling.  Endocrine: Negative for cold intolerance, heat intolerance, polydipsia, polyphagia and polyuria.  Genitourinary: Negative for dysuria, frequency and difficulty urinating.  Skin: Negative for rash.       + bite mark ? Tick / spider  Neurological: Negative for dizziness, light-headedness, numbness and headaches.    History Past Medical History  Diagnosis Date  . Hyperlipidemia     nmr lipoprofile 2004: LDL 154 (1653/711), HDL 42, TG 80.NMR  LDL =,125, but PMH  of MI LDL goal=,70  . HTN (hypertension)   . Coronary atherosclerosis of unspecified type of vessel, native or graft     MI 2006, h/o CABG  . Personal history of colonic polyps   . DJD (degenerative joint disease)   . Elevated prostate specific antigen (PSA)   . Other cataract   . Esophageal reflux   . Sleep apnea     uses CPAP  . Shortness of breath     DOE    He has past surgical history that includes Cataract extraction (Bilateral); Colonoscopy w/ polypectomy; Inguinal hernia repair; Total knee arthroplasty (2005); Coronary artery bypass graft (2006); Cardiac catheterization; Hernia repair; Total knee arthroplasty; and Total knee arthroplasty (08/25/2011).   His family history includes Arthritis in his brother and sister; Breast cancer in his mother and sister; Cancer in his father; Coronary artery disease in his mother; Crohn's disease in his sister; Heart attack (age of  onset: 52) in his paternal grandfather; Heart disease in his mother; Hypertension in his mother; Stomach cancer in his father. There is no history of Colon cancer or Prostate cancer.He reports that he quit smoking about 54 years ago. His smoking use included Cigarettes. He started smoking about 56 years ago. He has a .75 pack-year smoking history. He has never used smokeless tobacco. He reports that he drinks about 4.2 oz of alcohol per week. He reports that he does not use illicit drugs.  Current Outpatient Prescriptions on File Prior to Visit  Medication Sig Dispense Refill  . amoxicillin (AMOXIL) 500 MG capsule Take four (4) capsules by mouth as needed for dental procedures. 20 capsule 0  . aspirin 325 MG tablet Take 325 mg by mouth daily.    Marland Kitchen atorvastatin (LIPITOR) 40 MG tablet Take 1 tablet (40 mg total) by mouth daily. 90 tablet 2  . celecoxib (CELEBREX) 100 MG capsule Take 1 capsule (100 mg total) by mouth 2 (two) times daily. 180 capsule 1  . DOCUSATE SODIUM PO Take 1 capsule by mouth daily as needed (constipation).    . Efinaconazole (JUBLIA) 10 % SOLN Apply daily to toe nail x 48 weeks total 8 mL 6  . metoprolol succinate (TOPROL-XL) 25 MG 24 hr tablet Take 1 tablet (25 mg total) by mouth daily. 90 tablet 3  . Multiple Vitamin (MULTIVITAMIN) capsule Take 1 capsule by mouth daily.      . nitroGLYCERIN (NITROSTAT) 0.4 MG SL tablet Place 1 tablet (0.4 mg  total) under the tongue every 5 (five) minutes as needed. 25 tablet 3  . omeprazole (PRILOSEC) 20 MG capsule Take 1 capsule (20 mg total) by mouth daily. 90 capsule 2   No current facility-administered medications on file prior to visit.     Objective:  Objective Physical Exam  Constitutional: He is oriented to person, place, and time. Vital signs are normal. He appears well-developed and well-nourished. He is sleeping.  HENT:  Head: Normocephalic and atraumatic.  Mouth/Throat: Oropharynx is clear and moist.  Eyes: EOM are normal.  Pupils are equal, round, and reactive to light.  Neck: Normal range of motion. Neck supple. No thyromegaly present.  Cardiovascular: Normal rate and regular rhythm.   No murmur heard. Pulmonary/Chest: Effort normal and breath sounds normal. No respiratory distress. He has no wheezes. He has no rales. He exhibits no tenderness.  Musculoskeletal: He exhibits no edema or tenderness.  Neurological: He is alert and oriented to person, place, and time.  Skin: Skin is warm and dry. No rash noted. There is erythema.     Psychiatric: He has a normal mood and affect. His behavior is normal. Judgment and thought content normal.  Nursing note and vitals reviewed.  BP 134/76 mmHg  Pulse 67  Temp(Src) 97.8 F (36.6 C) (Oral)  Wt 202 lb (91.627 kg)  SpO2 97% Wt Readings from Last 3 Encounters:  12/18/15 202 lb (91.627 kg)  04/23/15 199 lb 8 oz (90.493 kg)  02/23/15 202 lb 4 oz (91.74 kg)     Lab Results  Component Value Date   WBC 5.6 04/25/2015   HGB 15.2 04/25/2015   HCT 43.9 04/25/2015   PLT 169.0 04/25/2015   GLUCOSE 111* 04/25/2015   CHOL 153 04/25/2015   TRIG 91.0 04/25/2015   HDL 45.60 04/25/2015   LDLCALC 89 04/25/2015   ALT 21 04/25/2015   AST 33 04/25/2015   NA 139 04/25/2015   K 4.6 04/25/2015   CL 103 04/25/2015   CREATININE 1.02 04/25/2015   BUN 17 04/25/2015   CO2 30 04/25/2015   TSH 0.75 04/26/2013   PSA 3.57 09/19/2014   INR 1.02 08/19/2011   HGBA1C 5.5 04/25/2015    No results found.   Assessment & Plan:  Plan I have discontinued Mr. Helle azelastine. I am also having him start on doxycycline. Additionally, I am having him maintain his multivitamin, Efinaconazole, aspirin, DOCUSATE SODIUM PO, nitroGLYCERIN, metoprolol succinate, omeprazole, atorvastatin, celecoxib, and amoxicillin.  Meds ordered this encounter  Medications  . doxycycline (VIBRA-TABS) 100 MG tablet    Sig: Take 1 tablet (100 mg total) by mouth 2 (two) times daily.    Dispense:  20  tablet    Refill:  0    Problem List Items Addressed This Visit    None    Visit Diagnoses    Tick bite of back, initial encounter    -  Primary    Relevant Medications    doxycycline (VIBRA-TABS) 100 MG tablet    Other Relevant Orders    CBC with Differential/Platelet    B. burgdorfi antibodies    Rocky mtn spotted fvr ab, IgM-blood       Follow-up: Return if symptoms worsen or fail to improve.  Ann Held, DO

## 2015-12-18 NOTE — Progress Notes (Signed)
Pre visit review using our clinic review tool, if applicable. No additional management support is needed unless otherwise documented below in the visit note. 

## 2015-12-19 ENCOUNTER — Telehealth: Payer: Self-pay | Admitting: *Deleted

## 2015-12-19 LAB — CBC WITH DIFFERENTIAL/PLATELET
Basophils Absolute: 0 10*3/uL (ref 0.0–0.1)
Basophils Relative: 0.3 % (ref 0.0–3.0)
EOS PCT: 2.4 % (ref 0.0–5.0)
Eosinophils Absolute: 0.2 10*3/uL (ref 0.0–0.7)
HEMATOCRIT: 45.2 % (ref 39.0–52.0)
Hemoglobin: 15.3 g/dL (ref 13.0–17.0)
LYMPHS ABS: 2.3 10*3/uL (ref 0.7–4.0)
LYMPHS PCT: 24.6 % (ref 12.0–46.0)
MCHC: 33.9 g/dL (ref 30.0–36.0)
MCV: 93.5 fl (ref 78.0–100.0)
MONOS PCT: 7.3 % (ref 3.0–12.0)
Monocytes Absolute: 0.7 10*3/uL (ref 0.1–1.0)
NEUTROS ABS: 6.1 10*3/uL (ref 1.4–7.7)
NEUTROS PCT: 65.4 % (ref 43.0–77.0)
Platelets: 164 10*3/uL (ref 150.0–400.0)
RBC: 4.83 Mil/uL (ref 4.22–5.81)
RDW: 13.3 % (ref 11.5–15.5)
WBC: 9.3 10*3/uL (ref 4.0–10.5)

## 2015-12-19 NOTE — Telephone Encounter (Signed)
-----   Message from Ann Held, [place] sent at 12/19/2015  4:04 PM EDT ----- normal

## 2015-12-19 NOTE — Telephone Encounter (Signed)
Called and spoke with the pt and informed him of recent lab results and note.  Pt verbalized understanding.  Pt wanted to know if he will need to continue the medication (Doxycycline 100mg ) that was given to him on yesterday.//AB/CMA

## 2015-12-20 ENCOUNTER — Encounter: Payer: Self-pay | Admitting: Gastroenterology

## 2015-12-20 LAB — ROCKY MTN SPOTTED FVR ABS PNL(IGG+IGM)
RMSF IGG: NOT DETECTED
RMSF IgM: NOT DETECTED

## 2015-12-20 LAB — LYME AB/WESTERN BLOT REFLEX

## 2015-12-20 NOTE — Telephone Encounter (Signed)
Yes--  Lyme not back yet

## 2015-12-21 NOTE — Telephone Encounter (Signed)
Called and [facility] @ 8:40am @ [phone]) informing the pt per Dr. Etter Sjogren he is to continue on the Doxycycline.  We have not received the Lyme results back yet.  But when we receive the results we will give him a call with the results.  Asked the pt to give [place] a call back if he has any questions or concerns.//AB/CMA

## 2015-12-26 ENCOUNTER — Encounter: Payer: Self-pay | Admitting: Internal Medicine

## 2015-12-26 ENCOUNTER — Ambulatory Visit (INDEPENDENT_AMBULATORY_CARE_PROVIDER_SITE_OTHER): Payer: Medicare Other | Admitting: Internal Medicine

## 2015-12-26 VITALS — BP 124/68 | HR 53 | Temp 98.0°F | Ht 69.0 in | Wt 203.4 lb

## 2015-12-26 DIAGNOSIS — I10 Essential (primary) hypertension: Secondary | ICD-10-CM | POA: Diagnosis not present

## 2015-12-26 DIAGNOSIS — R972 Elevated prostate specific antigen [PSA]: Secondary | ICD-10-CM

## 2015-12-26 LAB — URINALYSIS, ROUTINE W REFLEX MICROSCOPIC
BILIRUBIN URINE: NEGATIVE
HGB URINE DIPSTICK: NEGATIVE
KETONES UR: NEGATIVE
LEUKOCYTES UA: NEGATIVE
NITRITE: NEGATIVE
RBC / HPF: NONE SEEN (ref 0–?)
Specific Gravity, Urine: 1.01 (ref 1.000–1.030)
TOTAL PROTEIN, URINE-UPE24: NEGATIVE
URINE GLUCOSE: NEGATIVE
UROBILINOGEN UA: 0.2 (ref 0.0–1.0)
WBC, UA: NONE SEEN (ref 0–?)
pH: 7.5 (ref 5.0–8.0)

## 2015-12-26 LAB — PSA: PSA: 3.99 ng/mL (ref 0.10–4.00)

## 2015-12-26 LAB — BASIC METABOLIC PANEL
BUN: 19 mg/dL (ref 6–23)
CHLORIDE: 102 meq/L (ref 96–112)
CO2: 27 mEq/L (ref 19–32)
Calcium: 9.5 mg/dL (ref 8.4–10.5)
Creatinine, Ser: 0.91 mg/dL (ref 0.40–1.50)
GFR: 86.41 mL/min (ref >60.00)
Glucose, Bld: 92 mg/dL (ref 70–99)
POTASSIUM: 4.3 meq/L (ref 3.5–5.1)
SODIUM: 137 meq/L (ref 135–145)

## 2015-12-26 NOTE — Progress Notes (Signed)
Pre visit review using our clinic review tool, if applicable. No additional management support is needed unless otherwise documented below in the visit note. 

## 2015-12-26 NOTE — Progress Notes (Signed)
Subjective:    Patient ID: Benjamin Adkins, male    DOB: Jul 15, 1941, 75 y.o.   MRN: [id number]  DOS:  12/26/2015 Type of visit - description : Routine office visit Interval history: Good medication compliance without apparent side effects The patient remains active, goes to the gym Had a spyder or tick bite,  on doxycycline without side effects. Due for a PSA  Review of Systems Denies dysuria, gross hematuria difficulty urinating  Past Medical History  Diagnosis Date  . Hyperlipidemia     nmr lipoprofile 2004: LDL 154 (1653/711), HDL 42, TG 80.NMR  LDL =,125, but PMH  of MI LDL goal=,70  . HTN (hypertension)   . Coronary atherosclerosis of unspecified type of vessel, native or graft     MI 2006, h/o CABG  . Personal history of colonic polyps   . DJD (degenerative joint disease)   . Elevated prostate specific antigen (PSA)   . Other cataract   . Esophageal reflux   . Sleep apnea     uses CPAP  . Shortness of breath     DOE    Past Surgical History  Procedure Laterality Date  . Cataract extraction Bilateral   . Colonoscopy w/ polypectomy    . Inguinal hernia repair    . Total knee arthroplasty  2005  . Coronary artery bypass graft  2006    X 4   . Cardiac catheterization      2006  . Hernia repair      VENTRAL, LEFT & RIGHT GROIN  . Total knee arthroplasty      2006 (before MI)  . Total knee arthroplasty  08/25/2011    Procedure: TOTAL KNEE ARTHROPLASTY;  Surgeon: Lorn Junes, MD;  Location: [facility];  Service: Orthopedics;  Laterality: Right;  DR [place]    Social History   Social History  . Marital Status: Widowed    Spouse Name: N/A  . Number of Children: 1  . Years of Education: N/A   Occupational History  . retired    Social History Main Topics  . Smoking status: Former Smoker -- 0.25 packs/day for 3 years    Types: Cigarettes    Start date: 04/12/1959    Quit date: 08/12/1961  . Smokeless tobacco: Never Used   Comment: during college  . Alcohol Use: 4.2 oz/week    4 Glasses of wine, 3 Cans of beer per week     Comment: Social  . Drug Use: No  . Sexual Activity: Not on file   Other Topics Concern  . Not on file   Social History Narrative   Lives alone.         Medication List       This list is accurate as of: 12/26/15 11:59 PM.  Always use your most recent med list.               amoxicillin 500 MG capsule  Commonly known as:  AMOXIL  Take four (4) capsules by mouth as needed for dental procedures.     aspirin 325 MG tablet  Take 325 mg by mouth daily.     atorvastatin 40 MG tablet  Commonly known as:  LIPITOR  Take 1 tablet (40 mg total) by mouth daily.     celecoxib 100 MG capsule  Commonly known as:  CELEBREX  Take 1 capsule (100 mg total) by mouth 2 (two) times daily.     DOCUSATE SODIUM PO  Take 1 capsule by mouth daily as needed (constipation).     doxycycline 100 MG tablet  Commonly known as:  VIBRA-TABS  Take 1 tablet (100 mg total) by mouth 2 (two) times daily.     Efinaconazole 10 % Soln  Commonly known as:  JUBLIA  Apply daily to toe nail x 48 weeks total     metoprolol succinate 25 MG 24 hr tablet  Commonly known as:  TOPROL-XL  Take 1 tablet (25 mg total) by mouth daily.     multivitamin capsule  Take 1 capsule by mouth daily.     nitroGLYCERIN 0.4 MG SL tablet  Commonly known as:  NITROSTAT  Place 1 tablet (0.4 mg total) under the tongue every 5 (five) minutes as needed.     omeprazole 20 MG capsule  Commonly known as:  PRILOSEC  Take 1 capsule (20 mg total) by mouth daily.           Objective:   Physical Exam BP 124/68 mmHg  Pulse 53  Temp(Src) 98 F (36.7 C) (Oral)  Ht 5\' 9"  (1.753 m)  Wt 203 lb 6 oz (92.25 kg)  BMI 30.02 kg/m2  SpO2 95% General:   Well developed, well nourished . NAD.  HEENT:  Normocephalic . Face symmetric, atraumatic Lungs:  CTA B Normal respiratory effort, no intercostal retractions, no accessory muscle  use. Heart: RRR,  no murmur.  No pretibial edema bilaterally  DRE: Prostate is not symmetric, right lobe is slightly larger and more firm. No nodularity or tenderness Skin: Not pale. Not jaundice Neurologic:  alert & oriented X3.  Speech normal, gait appropriate for age and unassisted Psych--  Cognition and judgment appear intact.  Cooperative with normal attention span and concentration.  Behavior appropriate. No anxious or depressed appearing.      Assessment & Plan:   Assessment > Prediabetes HTN Hyperlipidemia CAD, MI 2006, CABG, RBBB OSA , on CPAP DJD Elevated PSA  PLAN: Prediabetes: Last A1c satisfactory. HTN: cont Toprol, check a BMP CAD: Asx, on aspirin, Lipitor, BBs Elevated PSA: my intention was to check a PSA few months ago but we didn't. DRE today is asymmetric. plan: PSA,UA,  refer to urology.  Addendum: I explained the patient the possible meaning of an abnormal DRE, he prefers to hold the urology referral until we see the PSA  Skin lesions: See previous visit, saw dermatology, was told bx was benign. RTC 04-2017, CPX

## 2015-12-26 NOTE — Patient Instructions (Signed)
GO TO THE LAB : Get the blood work     GO TO THE FRONT DESK Schedule your next appointment for a  physical exam 04-2017, fasting.

## 2015-12-27 NOTE — Assessment & Plan Note (Signed)
Prediabetes: Last A1c satisfactory. HTN: cont Toprol, check a BMP CAD: Asx, on aspirin, Lipitor, BBs Elevated PSA: my intention was to check a PSA few months ago but we didn't. DRE today is asymmetric. plan: PSA,UA,  refer to urology.  Addendum: I explained the patient the possible meaning of an abnormal DRE, he prefers to hold the urology referral until we see the PSA  Skin lesions: See previous visit, saw dermatology, was told bx was benign. RTC 04-2017, CPX

## 2016-02-25 ENCOUNTER — Other Ambulatory Visit: Payer: Self-pay | Admitting: Internal Medicine

## 2016-02-27 ENCOUNTER — Ambulatory Visit (AMBULATORY_SURGERY_CENTER): Payer: Self-pay | Admitting: *Deleted

## 2016-02-27 VITALS — Ht 69.0 in | Wt 203.0 lb

## 2016-02-27 DIAGNOSIS — Z86010 Personal history of colonic polyps: Secondary | ICD-10-CM

## 2016-02-27 MED ORDER — NA SULFATE-K SULFATE-MG SULF 17.5-3.13-1.6 GM/177ML PO SOLN: 1.0000 | Freq: Once | ORAL | 0 refills | Status: AC

## 2016-02-27 NOTE — Addendum Note (Signed)
Addended by: [place] Bailiff D on: 02/27/2016 02:57 PM   Modules accepted: Orders

## 2016-02-27 NOTE — Progress Notes (Signed)
No egg or soy allergy. No anesthesia problems.  No home O2.  No diet meds.  

## 2016-03-12 ENCOUNTER — Encounter: Payer: Medicare Other | Admitting: Gastroenterology

## 2016-04-03 ENCOUNTER — Encounter: Payer: Self-pay | Admitting: Gastroenterology

## 2016-04-09 ENCOUNTER — Encounter: Payer: Medicare Other | Admitting: Gastroenterology

## 2016-04-10 ENCOUNTER — Ambulatory Visit (INDEPENDENT_AMBULATORY_CARE_PROVIDER_SITE_OTHER): Payer: Medicare Other | Admitting: Behavioral Health

## 2016-04-10 DIAGNOSIS — Z23 Encounter for immunization: Secondary | ICD-10-CM | POA: Diagnosis not present

## 2016-04-10 NOTE — Progress Notes (Addendum)
Pre visit review using our clinic review tool, if applicable. No additional management support is needed unless otherwise documented below in the visit note.  Patient in office today for Influenza vaccination. IM given in Left Deltoid. Patient tolerated injection well.

## 2016-04-17 ENCOUNTER — Ambulatory Visit (AMBULATORY_SURGERY_CENTER): Payer: Medicare Other | Admitting: Gastroenterology

## 2016-04-17 ENCOUNTER — Encounter: Payer: Self-pay | Admitting: Gastroenterology

## 2016-04-17 VITALS — BP 110/77 | HR 58 | Temp 97.8°F | Resp 22 | Ht 69.0 in | Wt 203.0 lb

## 2016-04-17 DIAGNOSIS — Z1211 Encounter for screening for malignant neoplasm of colon: Secondary | ICD-10-CM

## 2016-04-17 DIAGNOSIS — Z86010 Personal history of colonic polyps: Secondary | ICD-10-CM

## 2016-04-17 DIAGNOSIS — Z1212 Encounter for screening for malignant neoplasm of rectum: Secondary | ICD-10-CM

## 2016-04-17 DIAGNOSIS — D120 Benign neoplasm of cecum: Secondary | ICD-10-CM

## 2016-04-17 MED ORDER — SODIUM CHLORIDE 0.9 % IV SOLN: 500.0000 mL | INTRAVENOUS | Status: DC

## 2016-04-17 NOTE — Progress Notes (Signed)
Spontaneous respirations throughout. VSS. Resting comfortably. To PACU on room air. Report to  Penny RN.  

## 2016-04-17 NOTE — Op Note (Signed)
[facility] Patient Name: Benjamin Adkins Procedure Date: 04/17/2016 9:34 AM MRN: [id number] Endoscopist: Remo Lipps P. Havery Moros , MD Age: 75 Referring MD:  Date of Birth: 1941/04/19 Gender: Male Account #: [id number] Procedure:                Colonoscopy Indications:              Screening for malignant neoplasm in the colon, last                            colonoscopy around 10 years ago Medicines:                Monitored Anesthesia Care Procedure:                Pre-Anesthesia Assessment:                           - Prior to the procedure, a History and Physical                            was performed, and patient medications and                            allergies were reviewed. The patient's tolerance of                            previous anesthesia was also reviewed. The risks                            and benefits of the procedure and the sedation                            options and risks were discussed with the patient.                            All questions were answered, and informed consent                            was obtained. Prior Anticoagulants: The patient has                            taken aspirin, last dose was 1 day prior to                            procedure. ASA Grade Assessment: III - A patient                            with severe systemic disease. After reviewing the                            risks and benefits, the patient was deemed in                            satisfactory condition to undergo the procedure.  After obtaining informed consent, the colonoscope                            was passed under direct vision. Throughout the                            procedure, the patient's blood pressure, pulse, and                            oxygen saturations were monitored continuously. The                            Model CF-HQ190L [id number]) scope was introduced                            through the anus and  advanced to the the cecum,                            identified by appendiceal orifice and ileocecal                            valve. The colonoscopy was performed without                            difficulty. The patient tolerated the procedure                            well. The quality of the bowel preparation was                            good. The ileocecal valve, appendiceal orifice, and                            rectum were photographed. Scope In: 9:39:55 AM Scope Out: 9:58:37 AM Scope Withdrawal Time: 0 hours 14 minutes 39 seconds  Total Procedure Duration: 0 hours 18 minutes 42 seconds  Findings:                 The perianal and digital rectal examinations were                            normal.                           A 7 mm polyp was found in the cecum. The polyp was                            sessile. The polyp was removed with a cold snare.                            Resection and retrieval were complete.                           Many medium-mouthed diverticula were found in the  transverse colon and left colon.                           Non-bleeding internal hemorrhoids were found during                            retroflexion.                           The exam was otherwise without abnormality. Complications:            No immediate complications. Estimated blood loss:                            Minimal. Estimated Blood Loss:     Estimated blood loss was minimal. Impression:               - One 7 mm polyp in the cecum, removed with a cold                            snare. Resected and retrieved.                           - Diverticulosis in the transverse colon and in the                            left colon.                           - Non-bleeding internal hemorrhoids.                           - The examination was otherwise normal. Recommendation:           - Patient has a contact number available for                             emergencies. The signs and symptoms of potential                            delayed complications were discussed with the                            patient. Return to normal activities tomorrow.                            Written discharge instructions were provided to the                            patient.                           - Resume previous diet.                           - Continue present medications including aspirin                           -  No ibuprofen, naproxen, or other non-steroidal                            anti-inflammatory drugs for 2 weeks after polyp                            removal.                           - Await pathology results.                           - Repeat colonoscopy is recommended for                            surveillance. The colonoscopy date will be                            determined after pathology results from today's                            exam become available for review. Remo Lipps P. Tomaz Janis, MD 04/17/2016 10:02:56 AM This report has been signed electronically.

## 2016-04-17 NOTE — Patient Instructions (Signed)
YOU HAD AN ENDOSCOPIC PROCEDURE TODAY AT [facility]:   Refer to the procedure report that was given to you for any specific questions about what was found during the examination.  If the procedure report does not answer your questions, please call your gastroenterologist to clarify.  If you requested that your care partner not be given the details of your procedure findings, then the procedure report has been included in a sealed envelope for you to review at your convenience later.  YOU SHOULD EXPECT: Some feelings of bloating in the abdomen. Passage of more gas than usual.  Walking can help get rid of the air that was put into your GI tract during the procedure and reduce the bloating. If you had a lower endoscopy (such as a colonoscopy or flexible sigmoidoscopy) you may notice spotting of blood in your stool or on the toilet paper. If you underwent a bowel prep for your procedure, you may not have a normal bowel movement for a few days.  Please Note:  You might notice some irritation and congestion in your nose or some drainage.  This is from the oxygen used during your procedure.  There is no need for concern and it should clear up in a day or so.  SYMPTOMS TO REPORT IMMEDIATELY:   Following lower endoscopy (colonoscopy or flexible sigmoidoscopy):  Excessive amounts of blood in the stool  Significant tenderness or worsening of abdominal pains  Swelling of the abdomen that is new, acute  Fever of 100F or higher   For urgent or emergent issues, a gastroenterologist can be reached at any hour by calling [phone].   DIET:  We do recommend a small meal at first, but then you may proceed to your regular diet.  Drink plenty of fluids but you should avoid alcoholic beverages for 24 hours.  ACTIVITY:  You should plan to take it easy for the rest of today and you should NOT DRIVE or use heavy machinery until tomorrow (because of the sedation medicines used during the test).     FOLLOW UP: Our staff will call the number listed on your records the next business day following your procedure to check on you and address any questions or concerns that you may have regarding the information given to you following your procedure. If we do not reach you, we will leave a message.  However, if you are feeling well and you are not experiencing any problems, there is no need to return our call.  We will assume that you have returned to your regular daily activities without incident.  If any biopsies were taken you will be contacted by phone or by letter within the next 1-3 weeks.  Please call us at [phone] if you have not heard about the biopsies in 3 weeks.    SIGNATURES/CONFIDENTIALITY: You and/or your care partner have signed paperwork which will be entered into your electronic medical record.  These signatures attest to the fact that that the information above on your After Visit Summary has been reviewed and is understood.  Full responsibility of the confidentiality of this discharge information lies with you and/or your care-partner.   INFORMATION ON POLYPS,DIVERTICULOSIS ,AND HEMORRHOIDS GIVEN TO YOU TODAY  NO IBUPROFEN ,ADVIL, OR OTHER ANTI INFLAMMATORY PRODUCTS FOR 2 WEEKS AFTER POLYP REMOVAL

## 2016-04-18 ENCOUNTER — Telehealth: Payer: Self-pay | Admitting: *Deleted

## 2016-04-18 NOTE — Telephone Encounter (Signed)
  Follow up Call-  Call back number 04/17/2016  Post procedure Call Back phone  # [phone]  Permission to leave phone message Yes  Some recent data might be hidden     Patient questions:  Do you have a fever, pain , or abdominal swelling? No. Pain Score  0 *  Have you tolerated food without any problems? Yes.    Have you been able to return to your normal activities? Yes.    Do you have any questions about your discharge instructions: Diet   No. Medications  No. Follow up visit  No.  Do you have questions or concerns about your Care? No.  Actions: * If pain score is 4 or above: No action needed, pain <4.

## 2016-04-21 ENCOUNTER — Telehealth: Payer: Self-pay | Admitting: Cardiovascular Disease

## 2016-04-21 NOTE — Telephone Encounter (Signed)
Patient calling about colonoscopy he had with Dr. Havery Moros on 04/17/16. Patient stated he was told he has some irregularity to his heart rhythm. Patient wanted to make his cardiologist aware, so if he needs to come in to see Dr. Johnsie Cancel before January, he can. Will forward to Dr. Havery Moros to help to identifying patient's concern.

## 2016-04-21 NOTE — Telephone Encounter (Signed)
Will forward to [place] CRNA.

## 2016-04-21 NOTE — Telephone Encounter (Signed)
Hi, I don't think I mentioned this in his endoscopy report. Any way to have anesthesia review his file to see if they put anything in there regarding this? Thanks

## 2016-04-21 NOTE — Telephone Encounter (Signed)
New message   Pt verbalized that he had a colonoscopy 04/17/16 by Dr.Armbuster and he told the pt that he has an abnormality in his heart rate. pt wants to see what Dr.Nishan thinks about it

## 2016-04-23 ENCOUNTER — Telehealth: Payer: Self-pay | Admitting: Gastroenterology

## 2016-04-23 NOTE — Telephone Encounter (Signed)
Called patient he reports noticing blood in his stool x 2 days, has just resumed normal bowel movements following his colonoscopy on 9/28. Patient denies pain. Patient did use a suppository with hydrocortisone he had left from last year, but is concerned that hemorrhoids are irritated from the procedure. Please advise.

## 2016-04-23 NOTE — Telephone Encounter (Signed)
Spoke to patient he states just a small amount in the stool, only twice. Once yesterday and today. Patient is on ASA 325 mg for his heart. Advised to not take any other NSAID. Patient did state that the last time he had these issues he continued taking his ASA and it resolved. Patient understands that he is to call the office if symptoms continue or get worse, he is to continue taking his suppositories.

## 2016-04-23 NOTE — Telephone Encounter (Signed)
Can he clarify how much blood he is seeing, and how frequent this is occurring? Small / scant amount, or anything large volume?  I would suspect this is likely hemorrhoidal bleeding. He had a small polyp removed with cold snare 6 days ago, it would be unusual for this to be bleeding from this type of polypectomy site this far out from the procedure. He had hemorrhoids noted on the exam which could be the more likely source, pending he is not having significant bleeding. I agree with the hydrocortizone suppository and he should avoid NSAIDs. If his symptoms persist or is concerning for a large amount of bleeding please let me know. Thanks

## 2016-04-30 NOTE — Progress Notes (Signed)
Sending to Dr. Havery Moros  - his patient

## 2016-05-02 ENCOUNTER — Encounter: Payer: Self-pay | Admitting: *Deleted

## 2016-05-04 ENCOUNTER — Other Ambulatory Visit: Payer: Self-pay | Admitting: Internal Medicine

## 2016-05-06 ENCOUNTER — Ambulatory Visit (INDEPENDENT_AMBULATORY_CARE_PROVIDER_SITE_OTHER): Payer: Medicare Other | Admitting: Internal Medicine

## 2016-05-06 ENCOUNTER — Encounter: Payer: Self-pay | Admitting: Internal Medicine

## 2016-05-06 VITALS — BP 128/68 | HR 68 | Temp 98.6°F | Resp 14 | Ht 69.0 in | Wt 201.4 lb

## 2016-05-06 DIAGNOSIS — Z125 Encounter for screening for malignant neoplasm of prostate: Secondary | ICD-10-CM

## 2016-05-06 DIAGNOSIS — Z0000 Encounter for general adult medical examination without abnormal findings: Secondary | ICD-10-CM | POA: Diagnosis not present

## 2016-05-06 NOTE — Patient Instructions (Signed)
GO TO THE FRONT DESK  Schedule labs to be done this week, fasting Schedule your next appointment for a  checkup in 6 months      Fall Prevention and Home Safety Falls cause injuries and can affect all age groups. It is possible to use preventive measures to significantly decrease the likelihood of falls. There are many simple measures which can make your home safer and prevent falls. OUTDOORS  Repair cracks and edges of walkways and driveways.  Remove high doorway thresholds.  Trim shrubbery on the main path into your home.  Have good outside lighting.  Clear walkways of tools, rocks, debris, and clutter.  Check that handrails are not broken and are securely fastened. Both sides of steps should have handrails.  Have leaves, snow, and ice cleared regularly.  Use sand or salt on walkways during winter months.  In the garage, clean up grease or oil spills. BATHROOM  Install night lights.  Install grab bars by the toilet and in the tub and shower.  Use non-skid mats or decals in the tub or shower.  Place a plastic non-slip stool in the shower to sit on, if needed.  Keep floors dry and clean up all water on the floor immediately.  Remove soap buildup in the tub or shower on a regular basis.  Secure bath mats with non-slip, double-sided rug tape.  Remove throw rugs and tripping hazards from the floors. BEDROOMS  Install night lights.  Make sure a bedside light is easy to reach.  Do not use oversized bedding.  Keep a telephone by your bedside.  Have a firm chair with side arms to use for getting dressed.  Remove throw rugs and tripping hazards from the floor. KITCHEN  Keep handles on pots and pans turned toward the center of the stove. Use back burners when possible.  Clean up spills quickly and allow time for drying.  Avoid walking on wet floors.  Avoid hot utensils and knives.  Position shelves so they are not too high or low.  Place commonly used  objects within easy reach.  If necessary, use a sturdy step stool with a grab bar when reaching.  Keep electrical cables out of the way.  Do not use floor polish or wax that makes floors slippery. If you must use wax, use non-skid floor wax.  Remove throw rugs and tripping hazards from the floor. STAIRWAYS  Never leave objects on stairs.  Place handrails on both sides of stairways and use them. Fix any loose handrails. Make sure handrails on both sides of the stairways are as long as the stairs.  Check carpeting to make sure it is firmly attached along stairs. Make repairs to worn or loose carpet promptly.  Avoid placing throw rugs at the top or bottom of stairways, or properly secure the rug with carpet tape to prevent slippage. Get rid of throw rugs, if possible.  Have an electrician put in a light switch at the top and bottom of the stairs. OTHER FALL PREVENTION TIPS  Wear low-heel or rubber-soled shoes that are supportive and fit well. Wear closed toe shoes.  When using a stepladder, make sure it is fully opened and both spreaders are firmly locked. Do not climb a closed stepladder.  Add color or contrast paint or tape to grab bars and handrails in your home. Place contrasting color strips on first and last steps.  Learn and use mobility aids as needed. Install an electrical emergency response system.  Turn on  lights to avoid dark areas. Replace light bulbs that burn out immediately. Get light switches that glow.  Arrange furniture to create clear pathways. Keep furniture in the same place.  Firmly attach carpet with non-skid or double-sided tape.  Eliminate uneven floor surfaces.  Select a carpet pattern that does not visually hide the edge of steps.  Be aware of all pets. OTHER HOME SAFETY TIPS  Set the water temperature for 120 F (48.8 C).  Keep emergency numbers on or near the telephone.  Keep smoke detectors on every level of the home and near sleeping  areas. Document Released: 06/27/2002 Document Revised: 01/06/2012 Document Reviewed: 09/26/2011 [facility] Patient Information 2015 [place], [place]. This information is not intended to replace advice given to you by your health care provider. Make sure you discuss any questions you have with your health care provider.   Preventive Care for Adults Ages 70 and over  Blood pressure check.** / Every 1 to 2 years.  Lipid and cholesterol check.**/ Every 5 years beginning at age 8.  Lung cancer screening. / Every year if you are aged 69-80 years and have a 30-pack-year history of smoking and currently smoke or have quit within the past 15 years. Yearly screening is stopped once you have quit smoking for at least 15 years or develop a health problem that would prevent you from having lung cancer treatment.  Fecal occult blood test (FOBT) of stool. / Every year beginning at age 58 and continuing until age 31. You may not have to do this test if you get a colonoscopy every 10 years.  Flexible sigmoidoscopy** or colonoscopy.** / Every 5 years for a flexible sigmoidoscopy or every 10 years for a colonoscopy beginning at age 14 and continuing until age 31.  Hepatitis C blood test.** / For all people born from [birth year] through 1965 and any individual with known risks for hepatitis C.  Abdominal aortic aneurysm (AAA) screening.** / A one-time screening for ages 82 to 76 years who are current or former smokers.  Skin self-exam. / Monthly.  Influenza vaccine. / Every year.  Tetanus, diphtheria, and acellular pertussis (Tdap/Td) vaccine.** / 1 dose of Td every 10 years.  Varicella vaccine.** / Consult your health care provider.  Zoster vaccine.** / 1 dose for adults aged 40 years or older.  Pneumococcal 13-valent conjugate (PCV13) vaccine.** / Consult your health care provider.  Pneumococcal polysaccharide (PPSV23) vaccine.** / 1 dose for all adults aged 41 years and older.  Meningococcal vaccine.** /  Consult your health care provider.  Hepatitis A vaccine.** / Consult your health care provider.  Hepatitis B vaccine.** / Consult your health care provider.  Haemophilus influenzae type b (Hib) vaccine.** / Consult your health care provider. **Family history and personal history of risk and conditions may change your health care provider's recommendations. Document Released: 09/02/2001 Document Revised: 07/12/2013 Document Reviewed: 12/02/2010 [facility] Patient Information 2015 [place], [place]. This information is not intended to replace advice given to you by your health care provider. Make sure you discuss any questions you have with your health care provider.

## 2016-05-06 NOTE — Assessment & Plan Note (Addendum)
Tdap: 08/2103 ; PNA: 07/2013 ; prevnar--04-2015; shingles:06/2011  Had a  Flu shot  CCS: 02/2006 Diverticulosis cscope 04-2016- 1 polyp, 5 years   PSA: See comments under elevated PSA Cont healthy lifestyle Has a healthcare power of attorney (daughter), fall prevention discussed

## 2016-05-06 NOTE — Assessment & Plan Note (Signed)
Prediabetes: A1c is stable over time. Recheck in few months Hyperlipidemia: Continue Lipitor, check labs CAD: To see cardiology in few months. Elevated PSA: DRE done 12/2015 showed the R  lobe is slightly larger and more firm. No nodularity or tenderness. PSA was stable. Recheck labs today. Implications of a abnormal DRE (cancer?) Were discussed with the patient. RTC 6 months mostly to follow-up the elevated PSA.

## 2016-05-06 NOTE — Progress Notes (Signed)
Subjective:    Patient ID: Benjamin Adkins, male    DOB: 12/08/1940, 75 y.o.   MRN: [id number]  DOS:  05/06/2016 Type of visit - description : CPX Interval history: No major concerns, medication list reviewed, labs reviewed.   Review of Systems Constitutional: No fever. No chills. No unexplained wt changes. No unusual sweats  HEENT: No dental problems, no ear discharge, no facial swelling, no voice changes. No eye discharge, no eye  redness , no  intolerance to light   Respiratory: No wheezing , no  difficulty breathing. No cough , no mucus production  Cardiovascular: No CP, no leg swelling , no  Palpitations  GI: no nausea, no vomiting, no diarrhea , no  abdominal pain.  No blood in the stools. No dysphagia, no odynophagia    Endocrine: No polyphagia, no polyuria , no polydipsia  GU: No dysuria, gross hematuria, difficulty urinating. No urinary urgency, no frequency.  Musculoskeletal: No joint swellings or unusual aches or pains  Skin: No change in the color of the skin, palor , no  Rash  Allergic, immunologic: No environmental allergies , no  food allergies  Neurological: No dizziness no  syncope. No headaches. No diplopia, no slurred, no slurred speech, no motor deficits, no facial  Numbness  Hematological: No enlarged lymph nodes, no easy bruising , no unusual bleedings  Psychiatry: No suicidal ideas, no hallucinations, no beavior problems, no confusion.  No unusual/severe anxiety, no depression   Past Medical History:  Diagnosis Date  . Coronary atherosclerosis of unspecified type of vessel, native or graft    MI 2006, h/o CABG  . DJD (degenerative joint disease)   . Elevated prostate specific antigen (PSA)   . Esophageal reflux   . HTN (hypertension)   . Hyperlipidemia    nmr lipoprofile 2004: LDL 154 (1653/711), HDL 42, TG 80.NMR  LDL =,125, but PMH  of MI LDL goal=,70  . Hypertension   . Myocardial infarction    2006  . Other cataract   . Personal  history of colonic polyps   . Shortness of breath    DOE  . Sleep apnea    uses CPAP    Past Surgical History:  Procedure Laterality Date  . CARDIAC CATHETERIZATION     2006  . CATARACT EXTRACTION Bilateral   . COLONOSCOPY W/ POLYPECTOMY    . CORONARY ARTERY BYPASS GRAFT  2006   X 4   . HERNIA REPAIR     VENTRAL, LEFT & RIGHT GROIN  . INGUINAL HERNIA REPAIR    . TOTAL KNEE ARTHROPLASTY  2005  . TOTAL KNEE ARTHROPLASTY     2006 (before MI)  . TOTAL KNEE ARTHROPLASTY  08/25/2011   Procedure: TOTAL KNEE ARTHROPLASTY;  Surgeon: Lorn Junes, MD;  Location: [facility];  Service: Orthopedics;  Laterality: Right;  DR [place]    Social History   Social History  . Marital status: Widowed    Spouse name: N/A  . Number of children: 1  . Years of education: N/A   Occupational History  . retired    Social History Main Topics  . Smoking status: Former Smoker    Packs/day: 0.25    Years: 3.00    Types: Cigarettes    Start date: 04/12/1959    Quit date: 08/12/1961  . Smokeless tobacco: Never Used     Comment: during college  . Alcohol use 4.2 oz/week    4 Glasses of wine, 3  Cans of beer per week     Comment: Social  . Drug use: No  . Sexual activity: Not on file   Other Topics Concern  . Not on file   Social History Narrative   Lives alone. Has a daughter in [place] , [place] [id number]     Family History  Problem Relation Age of Onset  . Cancer Father     stomach  . Stomach cancer Father   . Hypertension Mother   . Breast cancer Mother   . Coronary artery disease Mother   . Heart disease Mother   . Breast cancer Sister   . Crohn's disease Sister   . Arthritis Sister   . Arthritis Brother   . Heart attack Paternal Grandfather 8  . Colon cancer Neg Hx   . Prostate cancer Neg Hx        Medication List       Accurate as of 05/06/16  6:50 PM. Always use your most recent med list.          amoxicillin 500 MG  capsule Commonly known as:  AMOXIL Take four (4) capsules by mouth as needed for dental procedures.   aspirin 325 MG tablet Take 325 mg by mouth daily.   atorvastatin 40 MG tablet Commonly known as:  LIPITOR Take 1 tablet (40 mg total) by mouth daily.   celecoxib 100 MG capsule Commonly known as:  CELEBREX Take 1 capsule (100 mg total) by mouth 2 (two) times daily.   metoprolol succinate 25 MG 24 hr tablet Commonly known as:  TOPROL-XL Take 1 tablet (25 mg total) by mouth daily.   multivitamin capsule Take 1 capsule by mouth daily.   nitroGLYCERIN 0.4 MG SL tablet Commonly known as:  NITROSTAT Place 1 tablet (0.4 mg total) under the tongue every 5 (five) minutes as needed.   omeprazole 20 MG capsule Commonly known as:  PRILOSEC Take 1 capsule (20 mg total) by mouth daily.          Objective:   Physical Exam BP 128/68 (BP Location: Left Arm, Patient Position: Sitting, Cuff Size: Normal)   Pulse 68   Temp 98.6 F (37 C) (Oral)   Resp 14   Ht 5\' 9"  (1.753 m)   Wt 201 lb 6 oz (91.3 kg)   SpO2 96%   BMI 29.74 kg/m   General:   Well developed, well nourished . NAD.  Neck: No  thyromegaly  HEENT:  Normocephalic . Face symmetric, atraumatic Lungs:  CTA B Normal respiratory effort, no intercostal retractions, no accessory muscle use. Heart: RRR,  no murmur.  No pretibial edema bilaterally  Abdomen:  Not distended, soft, non-tender. No rebound or rigidity.   Skin: Exposed areas without rash. Not pale. Not jaundice Neurologic:  alert & oriented X3.  Speech normal, gait appropriate for age and unassisted Strength symmetric and appropriate for age.  Psych: Cognition and judgment appear intact.  Cooperative with normal attention span and concentration.  Behavior appropriate. No anxious or depressed appearing.    Assessment & Plan:  Assessment > Prediabetes HTN Hyperlipidemia CAD, MI 2006, CABG, RBBB OSA , on CPAP DJD Elevated PSA  PLAN: Prediabetes:  A1c is stable over time. Recheck in few months Hyperlipidemia: Continue Lipitor, check labs CAD: To see cardiology in few months. Elevated PSA: DRE done 12/2015 showed the R  lobe is slightly larger and more firm. No nodularity or tenderness. PSA was stable. Recheck labs today. Implications  of a abnormal DRE (cancer?) Were discussed with the patient. RTC 6 months mostly to follow-up the elevated PSA.

## 2016-05-06 NOTE — Progress Notes (Signed)
Pre visit review using our clinic review tool, if applicable. No additional management support is needed unless otherwise documented below in the visit note. 

## 2016-05-07 ENCOUNTER — Other Ambulatory Visit (INDEPENDENT_AMBULATORY_CARE_PROVIDER_SITE_OTHER): Payer: Medicare Other

## 2016-05-07 DIAGNOSIS — Z0000 Encounter for general adult medical examination without abnormal findings: Secondary | ICD-10-CM

## 2016-05-07 DIAGNOSIS — Z125 Encounter for screening for malignant neoplasm of prostate: Secondary | ICD-10-CM

## 2016-05-07 DIAGNOSIS — R972 Elevated prostate specific antigen [PSA]: Secondary | ICD-10-CM

## 2016-05-07 LAB — LIPID PANEL
CHOLESTEROL: 160 mg/dL (ref 0–200)
HDL: 48.4 mg/dL (ref >39.00)
LDL CALC: 84 mg/dL (ref 0–99)
NonHDL: 111.82
TRIGLYCERIDES: 137 mg/dL (ref 0.0–149.0)
Total CHOL/HDL Ratio: 3
VLDL: 27.4 mg/dL (ref 0.0–40.0)

## 2016-05-07 LAB — BASIC METABOLIC PANEL
BUN: 13 mg/dL (ref 6–23)
CALCIUM: 9.4 mg/dL (ref 8.4–10.5)
CO2: 29 meq/L (ref 19–32)
Chloride: 102 mEq/L (ref 96–112)
Creatinine, Ser: 0.99 mg/dL (ref 0.40–1.50)
GFR: 78.33 mL/min (ref >60.00)
GLUCOSE: 116 mg/dL — AB (ref 70–99)
POTASSIUM: 4.4 meq/L (ref 3.5–5.1)
SODIUM: 138 meq/L (ref 135–145)

## 2016-05-07 LAB — ALT: ALT: 26 U/L (ref 0–53)

## 2016-05-07 LAB — TSH: TSH: 1.77 u[IU]/mL (ref 0.35–4.50)

## 2016-05-07 LAB — AST: AST: 26 U/L (ref 0–37)

## 2016-05-07 LAB — PSA: PSA: 4.7 ng/mL — ABNORMAL HIGH (ref 0.10–4.00)

## 2016-05-15 ENCOUNTER — Encounter: Payer: Self-pay | Admitting: Internal Medicine

## 2016-05-19 ENCOUNTER — Encounter: Payer: Self-pay | Admitting: Certified Registered Nurse Anesthetist

## 2016-05-19 NOTE — Telephone Encounter (Signed)
Benjamin Cass CRNA and Dr. Havery Moros both do not recall any irregular heart rate or rhythm to report. Called patient to let him know that his record did not reflex any irregular heart rate or rhythm. Informed patient that he will have an EKG with his office visit with Benjamin Adkins in January, and we will compare with his previous EKG to see if there are any significant changes. Patient verbalized understanding.

## 2016-07-10 ENCOUNTER — Other Ambulatory Visit: Payer: Self-pay | Admitting: Cardiovascular Disease

## 2016-07-11 NOTE — Progress Notes (Signed)
Patient ID: Benjamin Adkins, male   DOB: 1940-11-19, 75 y.o.   MRN: [id number] This is a 75 y.o.  white male patient f/U CAD  In 2005 he had a perioperative MI after left knee replacement. He required urgent stenting to the RCA and subsequent CABG. He did have a negative treadmill prior to his knee surgery in 2005. He has done well since that time. He has not had any stress test or echo since then. He has a history of a right bundle branch block.his lipids are controlled. He has no history of diabetes and does not smoke.   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  All other systems reviewed and negative  General: Affect appropriate Healthy:  appears stated age 41: normal Neck supple with no adenopathy JVP normal no bruits no thyromegaly Lungs clear with no wheezing and good diaphragmatic motion Heart:  S1/S2 no murmur, no rub, gallop or click PMI normal Abdomen: benighn, BS positve, no tenderness, no AAA no bruit.  No HSM or HJR Distal pulses intact with no bruits  S/p bilateral TKR;s  No edema Neuro non-focal Skin warm and dry No muscular weakness   Current Outpatient Prescriptions  Medication Sig Dispense Refill  . amoxicillin (AMOXIL) 500 MG capsule Take four (4) capsules by mouth as needed for dental procedures. 20 capsule 0  . aspirin 325 MG tablet Take 325 mg by mouth daily.    Marland Kitchen atorvastatin (LIPITOR) 40 MG tablet Take 1 tablet (40 mg total) by mouth daily. 90 tablet 2  . celecoxib (CELEBREX) 100  MG capsule Take 1 capsule (100 mg total) by mouth 2 (two) times daily. 180 capsule 1  . metoprolol succinate (TOPROL-XL) 25 MG 24 hr tablet TAKE 1 TABLET BY MOUTH DAILY 90 tablet 0  . Multiple Vitamin (MULTIVITAMIN) capsule Take 1 capsule by mouth daily.      . nitroGLYCERIN (NITROSTAT) 0.4 MG SL tablet Place 1 tablet (0.4 mg total) under the tongue every 5 (five) minutes as needed. 25 tablet 3  . omeprazole (PRILOSEC) 20 MG capsule Take 1 capsule (20 mg total) by mouth daily. 90 capsule 1   Current Facility-Administered Medications  Medication Dose Route Frequency Provider Last Rate Last Dose  . 0.9 %  sodium chloride infusion  500 mL Intravenous Continuous Manus Gunning, MD        Allergies  Patient has no known allergies.  Electrocardiogram: SR RBBB LAFB  Old IMI  2013  Today NSR RBBB LAFB  Old IMI no change  05/29/15 SR RBBB old IMI no change 07/25/16  SR rate 64 RBBB LAFB old ILMI  Assessment and Plan CAD:  Stable with no angina and good activity level.  Continue medical Rx Chol:  On statin labs  [facility]  Lab Results  Component Value Date   LDLCALC 84 05/07/2016   GERD:  On prilosec discussed low carb diet  RBBB: chronic no high grade AV block yearly ECG   F/U with me in a  year  Jenkins Rouge

## 2016-07-25 ENCOUNTER — Telehealth: Payer: Self-pay | Admitting: Cardiovascular Disease

## 2016-07-25 ENCOUNTER — Encounter: Payer: Self-pay | Admitting: Cardiovascular Disease

## 2016-07-25 ENCOUNTER — Ambulatory Visit (INDEPENDENT_AMBULATORY_CARE_PROVIDER_SITE_OTHER): Payer: Medicare Other | Admitting: Cardiovascular Disease

## 2016-07-25 VITALS — BP 130/80 | HR 60 | Ht 69.0 in | Wt 204.0 lb

## 2016-07-25 DIAGNOSIS — I252 Old myocardial infarction: Secondary | ICD-10-CM | POA: Diagnosis not present

## 2016-07-25 NOTE — Patient Instructions (Signed)

## 2016-07-25 NOTE — Addendum Note (Signed)
Addended by: Aris Georgia, Shamell Hittle L on: 07/25/2016 04:55 PM   Modules accepted: Orders

## 2016-07-25 NOTE — Telephone Encounter (Signed)
Updated patient's medication list at his request.

## 2016-07-25 NOTE — Telephone Encounter (Signed)
Benjamin Adkins is calling uin reference to his visit today , when looking at his AVS he has some questions about what it says about medication . States that medications were not discussed . Please call

## 2016-08-05 ENCOUNTER — Ambulatory Visit (INDEPENDENT_AMBULATORY_CARE_PROVIDER_SITE_OTHER): Payer: Medicare Other | Admitting: Internal Medicine

## 2016-08-05 ENCOUNTER — Encounter: Payer: Self-pay | Admitting: Internal Medicine

## 2016-08-05 VITALS — BP 124/72 | HR 73 | Temp 98.7°F | Resp 14 | Ht 69.0 in | Wt 204.5 lb

## 2016-08-05 DIAGNOSIS — J111 Influenza due to unidentified influenza virus with other respiratory manifestations: Secondary | ICD-10-CM

## 2016-08-05 DIAGNOSIS — R509 Fever, unspecified: Secondary | ICD-10-CM | POA: Diagnosis not present

## 2016-08-05 LAB — POCT INFLUENZA B: Rapid Influenza B Ag: POSITIVE

## 2016-08-05 LAB — POCT RAPID STREP A (OFFICE): Rapid Strep A Screen: NEGATIVE

## 2016-08-05 MED ORDER — HYDROCODONE-HOMATROPINE 5-1.5 MG/5ML PO SYRP: 5.0000 mL | ORAL_SOLUTION | Freq: Every evening | ORAL | 0 refills | Status: DC | PRN

## 2016-08-05 MED ORDER — OSELTAMIVIR PHOSPHATE 75 MG PO CAPS: 75.0000 mg | ORAL_CAPSULE | Freq: Two times a day (BID) | ORAL | 0 refills | Status: DC

## 2016-08-05 NOTE — Assessment & Plan Note (Signed)
PLAN: Influenza: Patient developed respiratory sx, strep test negative, flu +. He does not look toxic. Sx started 3 days ago but fever last night. Will treat with Tamiflu, hydrocodone for cough, okay to take it twice a day, discussed  drowsiness. See instructions Elevated PSA: Last PSA was elevated, he saw urology 06-2016, told to RTC in 6 months. RTC 04-2017. CPX and Medicare wellness.

## 2016-08-05 NOTE — Progress Notes (Signed)
Subjective:    Patient ID: Benjamin Adkins, male    DOB: 02-18-1941, 76 y.o.   MRN: [id number]  DOS:  08/05/2016 Type of visit - description : Acute visit Interval history:  Symptoms started 08/02/2016 with cough, mild sinus and chest congestion. The cough was severe at times, causing abdominal soreness. Last night for the first time had a fever of 102, this morning 101.2. He is taking Tylenol, Robitussin. He was in contact with a neighbor who had a respiratory illness..  Review of Systems  No sore throat per se Some nausea last night, no vomiting or diarrhea No myalgias.  Past Medical History:  Diagnosis Date  . Coronary atherosclerosis of unspecified type of vessel, native or graft    MI 2006, h/o CABG  . DJD (degenerative joint disease)   . Elevated prostate specific antigen (PSA)   . Esophageal reflux   . HTN (hypertension)   . Hyperlipidemia    nmr lipoprofile 2004: LDL 154 (1653/711), HDL 42, TG 80.NMR  LDL =,125, but PMH  of MI LDL goal=,70  . Hypertension   . Myocardial infarction    2006  . Other cataract   . Personal history of colonic polyps   . Shortness of breath    DOE  . Sleep apnea    uses CPAP    Past Surgical History:  Procedure Laterality Date  . CARDIAC CATHETERIZATION     2006  . CATARACT EXTRACTION Bilateral   . COLONOSCOPY W/ POLYPECTOMY    . CORONARY ARTERY BYPASS GRAFT  2006   X 4   . HERNIA REPAIR     VENTRAL, LEFT & RIGHT GROIN  . INGUINAL HERNIA REPAIR    . TOTAL KNEE ARTHROPLASTY  2005  . TOTAL KNEE ARTHROPLASTY  2006  . TOTAL KNEE ARTHROPLASTY  08/25/2011   Procedure: TOTAL KNEE ARTHROPLASTY;  Surgeon: Lorn Junes, MD;  Location: [facility];  Service: Orthopedics;  Laterality: Right;  DR [place]    Social History   Social History  . Marital status: Widowed    Spouse name: N/A  . Number of children: 1  . Years of education: N/A   Occupational History  . retired    Social History Main Topics  .  Smoking status: Former Smoker    Packs/day: 0.25    Years: 3.00    Types: Cigarettes    Start date: 04/12/1959    Quit date: 08/12/1961  . Smokeless tobacco: Never Used     Comment: during college  . Alcohol use 4.2 oz/week    4 Glasses of wine, 3 Cans of beer per week     Comment: Social  . Drug use: No  . Sexual activity: Not on file   Other Topics Concern  . Not on file   Social History Narrative   Lives alone. Has a daughter in [place] , [place] [phone]      Allergies as of 08/05/2016   No Known Allergies     Medication List       Accurate as of 08/05/16  9:10 PM. Always use your most recent med list.          amoxicillin 500 MG capsule Commonly known as:  AMOXIL Take four (4) capsules by mouth as needed for dental procedures.   aspirin 325 MG tablet Take 325 mg by mouth daily.   atorvastatin 40 MG tablet Commonly known as:  LIPITOR Take 1  tablet (40 mg total) by mouth daily.   celecoxib 100 MG capsule Commonly known as:  CELEBREX Take 100 mg by mouth daily as needed for mild pain or moderate pain.   HYDROcodone-homatropine 5-1.5 MG/5ML syrup Commonly known as:  HYCODAN Take 5 mLs by mouth at bedtime as needed for cough.   metoprolol succinate 25 MG 24 hr tablet Commonly known as:  TOPROL-XL TAKE 1 TABLET BY MOUTH DAILY   multivitamin capsule Take 1 capsule by mouth daily.   nitroGLYCERIN 0.4 MG SL tablet Commonly known as:  NITROSTAT Place 1 tablet (0.4 mg total) under the tongue every 5 (five) minutes as needed.   omeprazole 20 MG capsule Commonly known as:  PRILOSEC Take 1 capsule (20 mg total) by mouth daily.   oseltamivir 75 MG capsule Commonly known as:  TAMIFLU Take 1 capsule (75 mg total) by mouth 2 (two) times daily.          Objective:   Physical Exam BP 124/72 (BP Location: Left Arm, Patient Position: Sitting, Cuff Size: Normal)   Pulse 73   Temp 98.7 F (37.1 C) (Oral)   Resp 14   Ht 5\' 9"  (1.753 m)    Wt 204 lb 8 oz (92.8 kg)   SpO2 96%   BMI 30.20 kg/m  General:   Well developed, well nourished, afebrile, nontoxic-appearing HEENT:  Normocephalic . Face symmetric, atraumatic. TMs normal, throat symmetric slightly red, nose congested, mildly.. Lungs:  CTA B Normal respiratory effort, no intercostal retractions, no accessory muscle use. Heart: RRR,  no murmur.  No pretibial edema bilaterally  Skin: Not pale. Not jaundice Neurologic:  alert & oriented X3.  Speech normal, gait appropriate for age and unassisted Psych--  Cognition and judgment appear intact.  Cooperative with normal attention span and concentration.  Behavior appropriate. No anxious or depressed appearing.      Assessment & Plan:    Assessment  Prediabetes HTN Hyperlipidemia CAD, MI 2006, CABG, RBBB OSA , on CPAP DJD Elevated PSA  PLAN: Influenza: Patient developed respiratory sx, strep test negative, flu +. He does not look toxic. Sx started 3 days ago but fever last night. Will treat with Tamiflu, hydrocodone for cough, okay to take it twice a day, discussed  drowsiness. See instructions Elevated PSA: Last PSA was elevated, he saw urology 06-2016, told to RTC in 6 months. RTC 04-2017. CPX and Medicare wellness.

## 2016-08-05 NOTE — Progress Notes (Signed)
Pre visit review using our clinic review tool, if applicable. No additional management support is needed unless otherwise documented below in the visit note. 

## 2016-08-05 NOTE — Patient Instructions (Signed)
Next visit by 04-2017: Schedule a medicare wellness with our nurse and a physical exam , same day ok    Rest, fluids , tylenol  For cough:  Take Mucinex DM twice a day as needed until better If the cough continue, take hydrocodone at night. Will cause drowsiness  For nasal congestion: Use OTC Nasocort or Flonase : 2 nasal sprays on each side of the nose in the morning until you feel better  Avoid decongestants such as  Pseudoephedrine or phenylephrine    Take TAMIFLU  Call if not gradually better over the next  10 days  Call anytime if the symptoms become severe or if you feel better and then you get worse  You are contagious at least till the fever broke.     Influenza, Adult Influenza, more commonly known as "the flu," is a viral infection that primarily affects the respiratory tract. The respiratory tract includes organs that help you breathe, such as the lungs, nose, and throat. The flu causes many common cold symptoms, as well as a high fever and body aches. The flu spreads easily from person to person (is contagious). Getting a flu shot (influenza vaccination) every year is the best way to prevent influenza. What are the causes? Influenza is caused by a virus. You can catch the virus by:  Breathing in droplets from an infected person's cough or sneeze.  Touching something that was recently contaminated with the virus and then touching your mouth, nose, or eyes. What increases the risk? The following factors may make you more likely to get the flu:  Not cleaning your hands frequently with soap and water or alcohol-based hand sanitizer.  Having close contact with many people during cold and flu season.  Touching your mouth, eyes, or nose without washing or sanitizing your hands first.  Not drinking enough fluids or not eating a healthy diet.  Not getting enough sleep or exercise.  Being under a high amount of stress.  Not getting a yearly (annual) flu shot. You  may be at a higher risk of complications from the flu, such as a severe lung infection (pneumonia), if you:  Are over the age of 51.  Are pregnant.  Have a weakened disease-fighting system (immune system). You may have a weakened immune system if you:  Have HIV or AIDS.  Are undergoing chemotherapy.  Aretaking medicines that reduce the activity of (suppress) the immune system.  Have a long-term (chronic) illness, such as heart disease, kidney disease, diabetes, or lung disease.  Have a liver disorder.  Are obese.  Have anemia. What are the signs or symptoms? Symptoms of this condition typically last 4-10 days and may include:  Fever.  Chills.  Headache, body aches, or muscle aches.  Sore throat.  Cough.  Runny or congested nose.  Chest discomfort and cough.  Poor appetite.  Weakness or tiredness (fatigue).  Dizziness.  Nausea or vomiting. How is this diagnosed? This condition may be diagnosed based on your medical history and a physical exam. Your health care provider may do a nose or throat swab test to confirm the diagnosis. How is this treated? If influenza is detected early, you can be treated with antiviral medicine that can reduce the length of your illness and the severity of your symptoms. This medicine may be given by mouth (orally) or through an IV tube that is inserted in one of your veins. The goal of treatment is to relieve symptoms by taking care of yourself at home.  This may include taking over-the-counter medicines, drinking plenty of fluids, and adding humidity to the air in your home. In some cases, influenza goes away on its own. Severe influenza or complications from influenza may be treated in a hospital. Follow these instructions at home:  Take over-the-counter and prescription medicines only as told by your health care provider.  Use a cool mist humidifier to add humidity to the air in your home. This can make breathing easier.  Rest as  needed.  Drink enough fluid to keep your urine clear or pale yellow.  Cover your mouth and nose when you cough or sneeze.  Wash your hands with soap and water often, especially after you cough or sneeze. If soap and water are not available, use hand sanitizer.  Stay home from work or school as told by your health care provider. Unless you are visiting your health care provider, try to avoid leaving home until your fever has been gone for 24 hours without the use of medicine.  Keep all follow-up visits as told by your health care provider. This is important. How is this prevented?  Getting an annual flu shot is the best way to avoid getting the flu. You may get the flu shot in late summer, fall, or winter. Ask your health care provider when you should get your flu shot.  Wash your hands often or use hand sanitizer often.  Avoid contact with people who are sick during cold and flu season.  Eat a healthy diet, drink plenty of fluids, get enough sleep, and exercise regularly. Contact a health care provider if:  You develop new symptoms.  You have:  Chest pain.  Diarrhea.  A fever.  Your cough gets worse.  You produce more mucus.  You feel nauseous or you vomit. Get help right away if:  You develop shortness of breath or difficulty breathing.  Your skin or nails turn a bluish color.  You have severe pain or stiffness in your neck.  You develop a sudden headache or sudden pain in your face or ear.  You cannot stop vomiting. This information is not intended to replace advice given to you by your health care provider. Make sure you discuss any questions you have with your health care provider. Document Released: 07/04/2000 Document Revised: 12/13/2015 Document Reviewed: 05/01/2015 Elsevier Interactive Patient Education  2017 [employer].

## 2016-08-26 ENCOUNTER — Other Ambulatory Visit: Payer: Self-pay | Admitting: Internal Medicine

## 2016-09-03 ENCOUNTER — Telehealth: Payer: Self-pay | Admitting: *Deleted

## 2016-09-03 NOTE — Telephone Encounter (Signed)
Pt declines at this time

## 2016-09-04 ENCOUNTER — Other Ambulatory Visit: Payer: Self-pay | Admitting: Internal Medicine

## 2016-10-03 ENCOUNTER — Telehealth: Payer: Self-pay | Admitting: Internal Medicine

## 2016-10-03 ENCOUNTER — Encounter: Payer: Self-pay | Admitting: Internal Medicine

## 2016-10-03 MED ORDER — EFINACONAZOLE 10 % EX SOLN: CUTANEOUS | 6 refills | Status: DC

## 2016-10-03 NOTE — Telephone Encounter (Signed)
Caller name:Dayyan Relationship to patient: self Can be reached: [phone] Pharmacy: [id number] - [place], [place]  Reason for call: pt requesting jublia be sent in for toenail fungus. States never really went away. I advised he may need appt as it was last filled 01/26/2014. Pt states he wants sent in today and has appt in a couple weeks anyway. Doesn't need to come in.

## 2016-10-03 NOTE — Telephone Encounter (Signed)
Prescription sent, patient notified via message

## 2016-10-03 NOTE — Telephone Encounter (Signed)
Please advise 

## 2016-10-05 ENCOUNTER — Other Ambulatory Visit: Payer: Self-pay | Admitting: Cardiovascular Disease

## 2016-10-28 ENCOUNTER — Encounter: Payer: Self-pay | Admitting: Internal Medicine

## 2016-11-04 ENCOUNTER — Ambulatory Visit: Payer: Medicare Other | Admitting: Internal Medicine

## 2016-11-12 NOTE — Progress Notes (Deleted)
Subjective:   Benjamin Adkins is a 76 y.o. male who presents for Medicare Annual/Subsequent preventive examination.  Review of Systems:  No ROS.  Medicare Wellness Visit.     Sleep patterns: {SX; SLEEP PATTERNS:18802::"feels rested on waking","does not get up to void","gets up *** times nightly to void","sleeps *** hours nightly"}.   Home Safety/Smoke Alarms:   Living environment; residence and Firearm Safety: {Rehab home environment / accessibility:30080::"no firearms","firearms stored safely"}. Seat Belt Safety/Bike Helmet: Wears seat belt.   Counseling:   Eye Exam-  Dental-  Male:   [facility]- last 04/17/16. 1 polyp removed, diverticulosis, internal hemorrhoids. Clinic visit recommended in 5 years to determine need for repeat assessment.    PSA-  Lab Results  Component Value Date   PSA 4.70 (H) 05/07/2016   PSA 3.99 12/26/2015   PSA 3.57 09/19/2014         Objective:    Vitals: There were no vitals taken for this visit.  There is no height or weight on file to calculate BMI.  Tobacco History  Smoking Status  . Former Smoker  . Packs/day: 0.25  . Years: 3.00  . Types: Cigarettes  . Start date: 04/12/1959  . Quit date: 08/12/1961  Smokeless Tobacco  . Never Used    Comment: during college     Counseling given: Not Answered   Past Medical History:  Diagnosis Date  . Coronary atherosclerosis of unspecified type of vessel, native or graft    MI 2006, h/o CABG  . DJD (degenerative joint disease)   . Elevated prostate specific antigen (PSA)   . Esophageal reflux   . HTN (hypertension)   . Hyperlipidemia    nmr lipoprofile 2004: LDL 154 (1653/711), HDL 42, TG 80.NMR  LDL =,125, but PMH  of MI LDL goal=,70  . Hypertension   . Myocardial infarction    2006  . Other cataract   . Personal history of colonic polyps   . Shortness of breath    DOE  . Sleep apnea    uses CPAP   Past Surgical History:  Procedure Laterality Date  . CARDIAC CATHETERIZATION     2006    . CATARACT EXTRACTION Bilateral   . COLONOSCOPY W/ POLYPECTOMY    . CORONARY ARTERY BYPASS GRAFT  2006   X 4   . HERNIA REPAIR     VENTRAL, LEFT & RIGHT GROIN  . INGUINAL HERNIA REPAIR    . TOTAL KNEE ARTHROPLASTY  2005  . TOTAL KNEE ARTHROPLASTY  2006  . TOTAL KNEE ARTHROPLASTY  08/25/2011   Procedure: TOTAL KNEE ARTHROPLASTY;  Surgeon: Lorn Junes, MD;  Location: [facility];  Service: Orthopedics;  Laterality: Right;  DR Noemi Chapel WANTS 32 MINUTES FOR SURGERY   Family History  Problem Relation Age of Onset  . Cancer Father     stomach  . Stomach cancer Father   . Hypertension Mother   . Breast cancer Mother   . Coronary artery disease Mother   . Heart disease Mother   . Breast cancer Sister   . Crohn's disease Sister   . Arthritis Sister   . Arthritis Brother   . Heart attack Paternal Grandfather 51  . Colon cancer Neg Hx   . Prostate cancer Neg Hx    History  Sexual Activity  . Sexual activity: Not on file    Outpatient Encounter Prescriptions as of 11/13/2016  Medication Sig  . amoxicillin (AMOXIL) 500 MG capsule Take four (4) capsules by mouth as  needed for dental procedures. (Patient not taking: Reported on 08/05/2016)  . aspirin 325 MG tablet Take 325 mg by mouth daily.  Marland Kitchen atorvastatin (LIPITOR) 40 MG tablet Take 1 tablet (40 mg total) by mouth daily.  . celecoxib (CELEBREX) 100 MG capsule Take 1 capsule (100 mg total) by mouth 2 (two) times daily as needed.  . Efinaconazole (JUBLIA) 10 % SOLN Apply daily to toe nail x 48 weeks total  . HYDROcodone-homatropine (HYCODAN) 5-1.5 MG/5ML syrup Take 5 mLs by mouth at bedtime as needed for cough.  . metoprolol succinate (TOPROL-XL) 25 MG 24 hr tablet TAKE 1 TABLET BY MOUTH DAILY  . Multiple Vitamin (MULTIVITAMIN) capsule Take 1 capsule by mouth daily.    . nitroGLYCERIN (NITROSTAT) 0.4 MG SL tablet Place 1 tablet (0.4 mg total) under the tongue every 5 (five) minutes as needed. (Patient not taking: Reported on 08/05/2016)  .  omeprazole (PRILOSEC) 20 MG capsule Take 1 capsule (20 mg total) by mouth daily.  Marland Kitchen oseltamivir (TAMIFLU) 75 MG capsule Take 1 capsule (75 mg total) by mouth 2 (two) times daily.   No facility-administered encounter medications on file as of 11/13/2016.     Activities of Daily Living In your present state of health, do you have any difficulty performing the following activities: 05/06/2016 12/26/2015  Hearing? N N  Vision? N N  Difficulty concentrating or making decisions? N N  Walking or climbing stairs? N N  Dressing or bathing? N N  Doing errands, shopping? N N  Some recent data might be hidden    Patient Care Team: Colon Branch, MD as PCP - General (Internal Medicine) Josue Hector, MD as Consulting Physician (Cardiology) Volanda Napoleon, MD as Consulting Physician (Oncology) Ripley Fraise, MD as Referring Physician (Dermatology) Nickie Retort, MD as Consulting Physician (Urology)   Assessment:    Physical assessment deferred to PCP.  Exercise Activities and Dietary recommendations    Diet (meal preparation, eat out, water intake, caffeinated beverages, dairy products, fruits and vegetables): {Desc; diets:16563} Breakfast: Lunch:  Dinner:      Goals    None     Fall Risk Fall Risk  05/06/2016 12/26/2015 04/23/2015 02/23/2015 01/26/2014  Falls in the past year? No No No No No   Depression Screen PHQ 2/9 Scores 05/06/2016 12/26/2015 04/23/2015 02/23/2015  PHQ - 2 Score 0 0 0 0    Cognitive Function        Immunization History  Administered Date(s) Administered  . Influenza Split 05/27/2011, 04/02/2012  . Influenza Whole 05/04/2007, 05/18/2008, 05/22/2009, 05/16/2010  . Influenza, High Dose Seasonal PF 04/28/2013, 04/23/2015, 04/10/2016  . Influenza,inj,Quad PF,36+ Mos 08/10/2014  . Pneumococcal Conjugate-13 04/23/2015  . Pneumococcal Polysaccharide-23 08/09/2013  . Tetanus 08/23/2013  . Zoster 07/02/2011   Screening Tests Health Maintenance  Topic Date Due    . INFLUENZA VACCINE  02/18/2017  . TETANUS/TDAP  08/24/2023  . PNA vac Low Risk Adult  Completed      Plan:   Follow-up w/ PCP as scheduled.  During the course of the visit the patient was educated and counseled about the following appropriate screening and preventive services:   Vaccines to include Pneumococcal, Influenza, Td, HCV  Cardiovascular Disease  Colorectal cancer screening  Diabetes screening  Prostate Cancer Screening  Glaucoma screening  Nutrition counseling   Smoking cessation counseling  Patient Instructions (the written plan) was given to the patient.    Dorrene German, RN  11/12/2016

## 2016-11-12 NOTE — Progress Notes (Signed)
Pre visit review using our clinic review tool, if applicable. No additional management support is needed unless otherwise documented below in the visit note. 

## 2016-11-13 ENCOUNTER — Encounter: Payer: Self-pay | Admitting: Internal Medicine

## 2016-11-13 ENCOUNTER — Ambulatory Visit (INDEPENDENT_AMBULATORY_CARE_PROVIDER_SITE_OTHER): Payer: Medicare Other | Admitting: Internal Medicine

## 2016-11-13 VITALS — BP 126/70 | HR 58 | Temp 97.8°F | Resp 14 | Ht 69.0 in | Wt 204.2 lb

## 2016-11-13 DIAGNOSIS — I10 Essential (primary) hypertension: Secondary | ICD-10-CM

## 2016-11-13 DIAGNOSIS — R739 Hyperglycemia, unspecified: Secondary | ICD-10-CM | POA: Diagnosis not present

## 2016-11-13 DIAGNOSIS — E785 Hyperlipidemia, unspecified: Secondary | ICD-10-CM

## 2016-11-13 LAB — BASIC METABOLIC PANEL
BUN: 20 mg/dL (ref 6–23)
CHLORIDE: 103 meq/L (ref 96–112)
CO2: 27 meq/L (ref 19–32)
Calcium: 9.8 mg/dL (ref 8.4–10.5)
Creatinine, Ser: 0.92 mg/dL (ref 0.40–1.50)
GFR: 85.12 mL/min (ref >60.00)
Glucose, Bld: 120 mg/dL — ABNORMAL HIGH (ref 70–99)
Potassium: 4.1 mEq/L (ref 3.5–5.1)
SODIUM: 136 meq/L (ref 135–145)

## 2016-11-13 LAB — HEMOGLOBIN A1C: HEMOGLOBIN A1C: 5.8 % (ref 4.6–6.5)

## 2016-11-13 NOTE — Patient Instructions (Signed)
GO TO THE LAB : Get the blood work     GO TO THE FRONT DESK Schedule your next appointment for a  Physical exam by 04-2017

## 2016-11-13 NOTE — Progress Notes (Signed)
Subjective:    Patient ID: Benjamin Adkins, male    DOB: 09-Aug-1940, 76 y.o.   MRN: [id number]  DOS:  11/13/2016 Type of visit - description : rov Interval history: No major concerns. Good compliance with medications He remains active playing golf or going to the gym 3 times a week. Started Jublia for a right great toenail suspected fungal infection, good response, see assessment and plan   Review of Systems   Past Medical History:  Diagnosis Date  . Coronary atherosclerosis of unspecified type of vessel, native or graft    MI 2006, h/o CABG  . DJD (degenerative joint disease)   . Elevated prostate specific antigen (PSA)   . Esophageal reflux   . HTN (hypertension)   . Hyperlipidemia    nmr lipoprofile 2004: LDL 154 (1653/711), HDL 42, TG 80.NMR  LDL =,125, but PMH  of MI LDL goal=,70  . Hypertension   . Myocardial infarction ([facility])    2006  . Other cataract   . Personal history of colonic polyps   . Shortness of breath    DOE  . Sleep apnea    uses CPAP    Past Surgical History:  Procedure Laterality Date  . CARDIAC CATHETERIZATION     2006  . CATARACT EXTRACTION Bilateral   . COLONOSCOPY W/ POLYPECTOMY    . CORONARY ARTERY BYPASS GRAFT  2006   X 4   . HERNIA REPAIR     VENTRAL, LEFT & RIGHT GROIN  . INGUINAL HERNIA REPAIR    . TOTAL KNEE ARTHROPLASTY  2005  . TOTAL KNEE ARTHROPLASTY  2006  . TOTAL KNEE ARTHROPLASTY  08/25/2011   Procedure: TOTAL KNEE ARTHROPLASTY;  Surgeon: Lorn Junes, MD;  Location: [facility];  Service: Orthopedics;  Laterality: Right;  DR [place]    Social History   Social History  . Marital status: Widowed    Spouse name: N/A  . Number of children: 1  . Years of education: N/A   Occupational History  . retired    Social History Main Topics  . Smoking status: Former Smoker    Packs/day: 0.25    Years: 3.00    Types: Cigarettes    Start date: 04/12/1959    Quit date: 08/12/1961  . Smokeless tobacco:  Never Used     Comment: during college  . Alcohol use 4.2 oz/week    4 Glasses of wine, 3 Cans of beer per week     Comment: Social  . Drug use: No  . Sexual activity: Not on file   Other Topics Concern  . Not on file   Social History Narrative   Lives alone. Has a daughter in [place] , [place] [phone]      Allergies as of 11/13/2016   No Known Allergies     Medication List       Accurate as of 11/13/16 11:59 PM. Always use your most recent med list.          amoxicillin 500 MG capsule Commonly known as:  AMOXIL Take four (4) capsules by mouth as needed for dental procedures.   aspirin 325 MG tablet Take 325 mg by mouth daily.   atorvastatin 40 MG tablet Commonly known as:  LIPITOR Take 1 tablet (40 mg total) by mouth daily.   celecoxib 100 MG capsule Commonly known as:  CELEBREX Take 1 capsule (100 mg total) by mouth 2 (two) times  daily as needed.   Efinaconazole 10 % Soln Commonly known as:  JUBLIA Apply daily to toe nail x 48 weeks total   metoprolol succinate 25 MG 24 hr tablet Commonly known as:  TOPROL-XL TAKE 1 TABLET BY MOUTH DAILY   multivitamin capsule Take 1 capsule by mouth daily.   nitroGLYCERIN 0.4 MG SL tablet Commonly known as:  NITROSTAT Place 1 tablet (0.4 mg total) under the tongue every 5 (five) minutes as needed.   omeprazole 20 MG capsule Commonly known as:  PRILOSEC Take 1 capsule (20 mg total) by mouth daily.          Objective:   Physical Exam BP 126/70 (BP Location: Left Arm, Patient Position: Sitting, Cuff Size: Normal)   Pulse (!) 58   Temp 97.8 F (36.6 C) (Oral)   Resp 14   Ht 5\' 9"  (1.753 m)   Wt 204 lb 4 oz (92.6 kg)   SpO2 97%   BMI 30.16 kg/m   General:   Well developed, well nourished . NAD.  HEENT:  Normocephalic . Face symmetric, atraumatic Lungs:  CTA B Normal respiratory effort, no intercostal retractions, no accessory muscle use. Heart: RRR,  no murmur.  No pretibial edema  bilaterally  Skin: Not pale. Not jaundice Right great toenail: Slightly dystrophic distally Neurologic:  alert & oriented X3.  Speech normal, gait appropriate for age and unassisted Psych--  Cognition and judgment appear intact.  Cooperative with normal attention span and concentration.  Behavior appropriate. No anxious or depressed appearing.      Assessment & Plan:   Assessment  Prediabetes HTN Hyperlipidemia CAD, MI 2006, CABG, RBBB OSA , on CPAP DJD Knee replacement-- on abx pre-dental procedures  Elevated PSA  PLAN: Prediabetes: Has a good lifestyle, check A1c HTN: on Toprol,  takes Celebrex as needed thus will check a BMP Hyperlipidemia: Last LDL in the 80s, but ideal LDL is < 70. Continue Lipitor, watch diet, recheck in few months. Dystrophic nail: Suspected fungal infection, had Jublia for one year in the past it responded well; sx came back, currently on Jublia for a few weeks, nail already is looking better. Plan is to stay on the same medication for a year, if he fails to improve with topicals:  oral medications ?? He would be reluctant due to toxicity. RTC 04-2014 CPX

## 2016-11-14 NOTE — Assessment & Plan Note (Signed)
Prediabetes: Has a good lifestyle, check A1c HTN: on Toprol,  takes Celebrex as needed thus will check a BMP Hyperlipidemia: Last LDL in the 80s, but ideal LDL is < 70. Continue Lipitor, watch diet, recheck in few months. Dystrophic nail: Suspected fungal infection, had Jublia for one year in the past it responded well; sx came back, currently on Jublia for a few weeks, nail already is looking better. Plan is to stay on the same medication for a year, if he fails to improve with topicals:  oral medications ?? He would be reluctant due to toxicity. RTC 04-2014 CPX

## 2016-11-17 NOTE — Progress Notes (Addendum)
Subjective:   Benjamin Adkins is a 76 y.o. male who presents for Medicare Annual/Subsequent preventive examination.  Review of Systems:  No ROS.  Medicare Wellness Visit. Cardiac Risk Factors include: advanced age (>14men, >51 women);dyslipidemia;hypertension;male gender Sleep patterns: Wears CPAP. Sleeps well. Feels rested. Home Safety/Smoke Alarms:  Feels safe in home. Smoke alarms in place.  Living environment; residence and Firearm Safety: Lives alone in 2 story home. Guns safely stored. Seat Belt Safety/Bike Helmet: Wears seat belt.   Counseling:   Eye Exam- Wears bifocals. Yearly exams. Dental- Dr.Beshears every 8 months.  Male:   CCS- Last 04/17/16: Pre-Cancerous polyp removed. Return to office in 5 yrs to discuss further screening per report letter. PSA-  Lab Results  Component Value Date   PSA 4.70 (H) 05/07/2016   PSA 3.99 12/26/2015   PSA 3.57 09/19/2014       Objective:    Vitals: BP 136/76 (BP Location: Right Arm, Patient Position: Sitting, Cuff Size: Normal)   Pulse 62   Ht 5\' 9"  (1.753 m)   Wt 204 lb (92.5 kg)   SpO2 96%   BMI 30.13 kg/m   Body mass index is 30.13 kg/m.  Tobacco History  Smoking Status  . Former Smoker  . Packs/day: 0.25  . Years: 3.00  . Types: Cigarettes  . Start date: 04/12/1959  . Quit date: 08/12/1961  Smokeless Tobacco  . Never Used    Comment: during college     Counseling given: Not Answered   Past Medical History:  Diagnosis Date  . Coronary atherosclerosis of unspecified type of vessel, native or graft    MI 2006, h/o CABG  . DJD (degenerative joint disease)   . Elevated prostate specific antigen (PSA)   . Esophageal reflux   . HTN (hypertension)   . Hyperlipidemia    nmr lipoprofile 2004: LDL 154 (1653/711), HDL 42, TG 80.NMR  LDL =,125, but PMH  of MI LDL goal=,70  . Hypertension   . Myocardial infarction ([place])    2006  . Other cataract   . Personal history of colonic polyps   . Shortness of breath    DOE    . Sleep apnea    uses CPAP   Past Surgical History:  Procedure Laterality Date  . CARDIAC CATHETERIZATION     2006  . CATARACT EXTRACTION Bilateral   . COLONOSCOPY W/ POLYPECTOMY    . CORONARY ARTERY BYPASS GRAFT  2006   X 4   . HERNIA REPAIR     VENTRAL, LEFT & RIGHT GROIN  . INGUINAL HERNIA REPAIR    . TOTAL KNEE ARTHROPLASTY  2005  . TOTAL KNEE ARTHROPLASTY  2006  . TOTAL KNEE ARTHROPLASTY  08/25/2011   Procedure: TOTAL KNEE ARTHROPLASTY;  Surgeon: Lorn Junes, MD;  Location: [facility];  Service: Orthopedics;  Laterality: Right;  DR Noemi Chapel WANTS 58 MINUTES FOR SURGERY   Family History  Problem Relation Age of Onset  . Cancer Father     stomach  . Stomach cancer Father   . Hypertension Mother   . Breast cancer Mother   . Coronary artery disease Mother   . Heart disease Mother   . Breast cancer Sister   . Crohn's disease Sister   . Arthritis Sister   . Arthritis Brother   . Heart attack Paternal Grandfather 79  . Colon cancer Neg Hx   . Prostate cancer Neg Hx    History  Sexual Activity  . Sexual activity: Not Currently  Outpatient Encounter Prescriptions as of 11/18/2016  Medication Sig  . amoxicillin (AMOXIL) 500 MG capsule Take four (4) capsules by mouth as needed for dental procedures.  Marland Kitchen aspirin 325 MG tablet Take 325 mg by mouth daily.  Marland Kitchen atorvastatin (LIPITOR) 40 MG tablet Take 1 tablet (40 mg total) by mouth daily.  . celecoxib (CELEBREX) 100 MG capsule Take 1 capsule (100 mg total) by mouth 2 (two) times daily as needed.  . Efinaconazole (JUBLIA) 10 % SOLN Apply daily to toe nail x 48 weeks total  . metoprolol succinate (TOPROL-XL) 25 MG 24 hr tablet TAKE 1 TABLET BY MOUTH DAILY  . Multiple Vitamin (MULTIVITAMIN) capsule Take 1 capsule by mouth daily.    Marland Kitchen omeprazole (PRILOSEC) 20 MG capsule Take 1 capsule (20 mg total) by mouth daily.  . nitroGLYCERIN (NITROSTAT) 0.4 MG SL tablet Place 1 tablet (0.4 mg total) under the tongue every 5 (five) minutes as  needed. (Patient not taking: Reported on 08/05/2016)   No facility-administered encounter medications on file as of 11/18/2016.     Activities of Daily Living In your present state of health, do you have any difficulty performing the following activities: 11/18/2016 05/06/2016  Hearing? N N  Vision? N N  Difficulty concentrating or making decisions? N N  Walking or climbing stairs? N N  Dressing or bathing? N N  Doing errands, shopping? N N  Preparing Food and eating ? N -  Using the Toilet? N -  In the past six months, have you accidently leaked urine? N -  Do you have problems with loss of bowel control? N -  Managing your Medications? N -  Managing your Finances? N -  Housekeeping or managing your Housekeeping? N -  Some recent data might be hidden    Patient Care Team: Colon Branch, MD as PCP - General (Internal Medicine) Josue Hector, MD as Consulting Physician (Cardiology) Volanda Napoleon, MD as Consulting Physician (Oncology) Ripley Fraise, MD as Referring Physician (Dermatology) Nickie Retort, MD as Consulting Physician (Urology)   Assessment:    No ROS.  Medicare Wellness Visit.  Exercise Activities and Dietary recommendations Current Exercise Habits: Structured exercise class, Type of exercise: stretching;treadmill, Time (Minutes): 45, Frequency (Times/Week): 3, Weekly Exercise (Minutes/Week): 135, Intensity: Moderate Diet (meal preparation, eat out, water intake, caffeinated beverages, dairy products, fruits and vegetables): in general, a "healthy" diet     Goals    . Weight (lb) < 195 lb (88.5 kg)      Fall Risk Fall Risk  11/18/2016 05/06/2016 12/26/2015 04/23/2015 02/23/2015  Falls in the past year? No No No No No   Depression Screen PHQ 2/9 Scores 11/18/2016 05/06/2016 12/26/2015 04/23/2015  PHQ - 2 Score 0 0 0 0    Cognitive Function Ad8 score reviewed for issues:  Issues making decisions:no  Less interest in hobbies / activities:no  Repeats questions,  stories (family complaining):no  Trouble using ordinary gadgets (microwave, computer, phone):no  Forgets the month or year: no  Mismanaging finances: no  Remembering appts:no  Daily problems with thinking and/or memory:no Ad8 score is=0        Immunization History  Administered Date(s) Administered  . Influenza Split 05/27/2011, 04/02/2012  . Influenza Whole 05/04/2007, 05/18/2008, 05/22/2009, 05/16/2010  . Influenza, High Dose Seasonal PF 04/28/2013, 04/23/2015, 04/10/2016  . Influenza,inj,Quad PF,36+ Mos 08/10/2014  . Pneumococcal Conjugate-13 04/23/2015  . Pneumococcal Polysaccharide-23 08/09/2013  . Tetanus 08/23/2013  . Zoster 07/02/2011   Screening Tests Health  Maintenance  Topic Date Due  . INFLUENZA VACCINE  02/18/2017  . TETANUS/TDAP  08/24/2023  . PNA vac Low Risk Adult  Completed      Plan:     Follow up with Dr.Paz as directed.  Continue to eat heart healthy diet (full of fruits, vegetables, whole grains, lean protein, water--limit salt, fat, and sugar intake) and increase physical activity as tolerated.  Continue doing brain stimulating activities (puzzles, reading, adult coloring books, staying active) to keep memory sharp.   I have personally reviewed and noted the following in the patient's chart:   . Medical and social history . Use of alcohol, tobacco or illicit drugs  . Current medications and supplements . Functional ability and status . Nutritional status . Physical activity . Advanced directives . List of other physicians . Hospitalizations, surgeries, and ER visits in previous 12 months . Vitals . Screenings to include cognitive, depression, and falls . Referrals and appointments  In addition, I have reviewed and discussed with patient certain preventive protocols, quality metrics, and best practice recommendations. A written personalized care plan for preventive services as well as general preventive health recommendations were  provided to patient.     Naaman Plummer [place], [place]  11/18/2016  Kathlene November, MD

## 2016-11-17 NOTE — Progress Notes (Signed)
Pre visit review using our clinic review tool, if applicable. No additional management support is needed unless otherwise documented below in the visit note. 

## 2016-11-18 ENCOUNTER — Encounter: Payer: Self-pay | Admitting: Internal Medicine

## 2016-11-18 ENCOUNTER — Encounter: Payer: Self-pay | Admitting: *Deleted

## 2016-11-18 ENCOUNTER — Ambulatory Visit (INDEPENDENT_AMBULATORY_CARE_PROVIDER_SITE_OTHER): Payer: Medicare Other | Admitting: *Deleted

## 2016-11-18 VITALS — BP 136/76 | HR 62 | Ht 69.0 in | Wt 204.0 lb

## 2016-11-18 DIAGNOSIS — Z0000 Encounter for general adult medical examination without abnormal findings: Secondary | ICD-10-CM | POA: Diagnosis not present

## 2016-11-18 NOTE — Patient Instructions (Signed)
  Benjamin Adkins , Thank you for taking time to come for your Medicare Wellness Visit. I appreciate your ongoing commitment to your health goals. Please review the following plan we discussed and let me know if I can assist you in the future.   These are the goals we discussed: Goals    . Weight (lb) < 195 lb (88.5 kg)       This is a list of the screening recommended for you and due dates:  Health Maintenance  Topic Date Due  . Flu Shot  02/18/2017  . Tetanus Vaccine  08/24/2023  . Pneumonia vaccines  Completed   Continue to eat heart healthy diet (full of fruits, vegetables, whole grains, lean protein, water--limit salt, fat, and sugar intake) and increase physical activity as tolerated.  Continue doing brain stimulating activities (puzzles, reading, adult coloring books, staying active) to keep memory sharp.

## 2016-12-01 ENCOUNTER — Other Ambulatory Visit: Payer: Self-pay | Admitting: Internal Medicine

## 2017-01-26 ENCOUNTER — Other Ambulatory Visit: Payer: Self-pay | Admitting: Internal Medicine

## 2017-03-22 ENCOUNTER — Other Ambulatory Visit: Payer: Self-pay | Admitting: Internal Medicine

## 2017-05-19 ENCOUNTER — Other Ambulatory Visit: Payer: Self-pay | Admitting: Internal Medicine

## 2017-05-19 ENCOUNTER — Encounter: Payer: Self-pay | Admitting: Internal Medicine

## 2017-05-19 ENCOUNTER — Ambulatory Visit (INDEPENDENT_AMBULATORY_CARE_PROVIDER_SITE_OTHER): Payer: Medicare Other | Admitting: Internal Medicine

## 2017-05-19 VITALS — BP 126/68 | HR 62 | Temp 98.0°F | Resp 14 | Ht 69.0 in | Wt 204.5 lb

## 2017-05-19 DIAGNOSIS — Z0000 Encounter for general adult medical examination without abnormal findings: Secondary | ICD-10-CM | POA: Diagnosis not present

## 2017-05-19 DIAGNOSIS — R739 Hyperglycemia, unspecified: Secondary | ICD-10-CM | POA: Diagnosis not present

## 2017-05-19 MED ORDER — CELECOXIB 100 MG PO CAPS: 100.0000 mg | ORAL_CAPSULE | Freq: Two times a day (BID) | ORAL | 1 refills | Status: DC | PRN

## 2017-05-19 NOTE — Progress Notes (Signed)
Subjective:    Patient ID: Benjamin Adkins, male    DOB: 1941-04-10, 76 y.o.   MRN: [id number]  DOS:  05/19/2017 Type of visit - description : CPX Interval history: No major concerns, feels well.  Review of Systems Had a root canal 2 weeks ago, recuperating well. Nail dystrophy, using topical treatment, they are looking better.  Other than above, a 14 point review of systems is negative     Past Medical History:  Diagnosis Date  . Coronary atherosclerosis of unspecified type of vessel, native or graft    MI 2006, h/o CABG  . DJD (degenerative joint disease)   . Elevated prostate specific antigen (PSA)   . Esophageal reflux   . HTN (hypertension)   . Hyperlipidemia    nmr lipoprofile 2004: LDL 154 (1653/711), HDL 42, TG 80.NMR  LDL =,125, but PMH  of MI LDL goal=,70  . Hypertension   . Myocardial infarction ([place])    2006  . Other cataract   . Personal history of colonic polyps   . Shortness of breath    DOE  . Sleep apnea    uses CPAP    Past Surgical History:  Procedure Laterality Date  . CARDIAC CATHETERIZATION     2006  . CATARACT EXTRACTION Bilateral   . COLONOSCOPY W/ POLYPECTOMY    . CORONARY ARTERY BYPASS GRAFT  2006   X 4   . HERNIA REPAIR     VENTRAL, LEFT & RIGHT GROIN  . INGUINAL HERNIA REPAIR    . TOTAL KNEE ARTHROPLASTY  2005  . TOTAL KNEE ARTHROPLASTY  2006  . TOTAL KNEE ARTHROPLASTY  08/25/2011   Procedure: TOTAL KNEE ARTHROPLASTY;  Surgeon: Lorn Junes, MD;  Location: [facility];  Service: Orthopedics;  Laterality: Right;  DR Noemi Chapel WANTS 60 MINUTES FOR SURGERY   Family History  Problem Relation Age of Onset  . Cancer Father        stomach  . Stomach cancer Father   . Hypertension Mother   . Breast cancer Mother   . Coronary artery disease Mother   . Heart disease Mother   . Breast cancer Sister   . Crohn's disease Sister   . Arthritis Sister   . Arthritis Brother   . Heart attack Paternal Grandfather 5  . Colon cancer Neg Hx   .  Prostate cancer Neg Hx     Social History   Social History  . Marital status: Widowed    Spouse name: N/A  . Number of children: 1  . Years of education: N/A   Occupational History  . retired    Social History Main Topics  . Smoking status: Former Smoker    Packs/day: 0.25    Years: 3.00    Types: Cigarettes    Start date: 04/12/1959    Quit date: 08/12/1961  . Smokeless tobacco: Never Used     Comment: during college  . Alcohol use 4.2 oz/week    4 Glasses of wine, 3 Cans of beer per week     Comment: Social  . Drug use: No  . Sexual activity: Not Currently   Other Topics Concern  . Not on file   Social History Narrative   Lives alone. Has a daughter in [place] , [address] [phone]      Allergies as of 05/19/2017   No Known Allergies     Medication List       Accurate as of  05/19/17  5:09 PM. Always use your most recent med list.          amoxicillin 500 MG capsule Commonly known as:  AMOXIL TAKE 4 CAPSULES BY MOUTH AS NEEDED FOR DENTAL PROCEDURE   aspirin 325 MG tablet Take 325 mg by mouth daily.   atorvastatin 40 MG tablet Commonly known as:  LIPITOR Take 1 tablet (40 mg total) by mouth daily.   celecoxib 100 MG capsule Commonly known as:  CELEBREX Take 1 capsule (100 mg total) by mouth 2 (two) times daily as needed for moderate pain.   Efinaconazole 10 % Soln Commonly known as:  JUBLIA Apply daily to toe nail x 48 weeks total   metoprolol succinate 25 MG 24 hr tablet Commonly known as:  TOPROL-XL TAKE 1 TABLET BY MOUTH DAILY   multivitamin capsule Take 1 capsule by mouth daily.   nitroGLYCERIN 0.4 MG SL tablet Commonly known as:  NITROSTAT Place 1 tablet (0.4 mg total) under the tongue every 5 (five) minutes as needed.   omeprazole 20 MG capsule Commonly known as:  PRILOSEC Take 1 capsule (20 mg total) by mouth daily.          Objective:   Physical Exam BP 126/68 (BP Location: Left Arm, Patient Position:  Sitting, Cuff Size: Small)   Pulse 62   Temp 98 F (36.7 C) (Oral)   Resp 14   Ht 5\' 9"  (1.753 m)   Wt 204 lb 8 oz (92.8 kg)   SpO2 96%   BMI 30.20 kg/m  General:   Well developed, well nourished . NAD.  Neck: No  thyromegaly  HEENT:  Normocephalic . Face symmetric, atraumatic Lungs:  CTA B Normal respiratory effort, no intercostal retractions, no accessory muscle use. Heart: RRR,  no murmur.  No pretibial edema bilaterally  Abdomen:  Not distended, soft, non-tender. No rebound or rigidity.  Diastases recti noted. Skin: Exposed areas without rash. Not pale. Not jaundice Neurologic:  alert & oriented X3.  Speech normal, gait appropriate for age and unassisted Strength symmetric and appropriate for age.  Psych: Cognition and judgment appear intact.  Cooperative with normal attention span and concentration.  Behavior appropriate. No anxious or depressed appearing.     Assessment & Plan:   Assessment  Prediabetes HTN Hyperlipidemia CAD, MI 2006, CABG, RBBB OSA , on CPAP DJD Knee replacement-- on abx pre-dental procedures  Elevated PSA  PLAN: Prediabetes: Diet controlled, has a healthy lifestyle.  Checking labs HTN: Seems controlled on Toprol. Hyperlipidemia: On Lipitor, check and labs CAD: Asx Elevated PSA: Last visit with urology 12/2016, was told PSA was back to normal, they planning to recheck by 06/2017 and if that number is normal they are considering d/c further testing. RTC 8 months.

## 2017-05-19 NOTE — Progress Notes (Signed)
Pre visit review using our clinic review tool, if applicable. No additional management support is needed unless otherwise documented below in the visit note. 

## 2017-05-19 NOTE — Patient Instructions (Signed)
  GO TO THE FRONT DESK Schedule labs, fasting, within the next few days  Schedule your next appointment for a routine checkup in 8 months   Check the  blood pressure 2 or 3 times a month   Be sure your blood pressure is between 110/65 and  135/85. If it is consistently higher or lower, let me know  Consider a Medicare wellness with one of the nurses at your convenience

## 2017-05-19 NOTE — Assessment & Plan Note (Signed)
Prediabetes: Diet controlled, has a healthy lifestyle.  Checking labs HTN: Seems controlled on Toprol. Hyperlipidemia: On Lipitor, check and labs CAD: Asx Elevated PSA: Last visit with urology 12/2016, was told PSA was back to normal, they planning to recheck by 06/2017 and if that number is normal they are considering d/c further testing. RTC 8 months.

## 2017-05-19 NOTE — Assessment & Plan Note (Addendum)
-  Tdap: 08/2103 ; PNA: 07/2013 ; prevnar--04-2015; shingles:06/2011; shingrix discussed. Had a Flu shot  -CCS:  cscope 02/2006 Diverticulosis cscope 04-2016- 1 polyp, 5 years   -prostate ca screening: See comments under elevated PSA  -Has a healthy lifestyle, praised  RTC 8 months

## 2017-05-20 ENCOUNTER — Other Ambulatory Visit (INDEPENDENT_AMBULATORY_CARE_PROVIDER_SITE_OTHER): Payer: Medicare Other

## 2017-05-20 DIAGNOSIS — R739 Hyperglycemia, unspecified: Secondary | ICD-10-CM | POA: Diagnosis not present

## 2017-05-20 DIAGNOSIS — Z0000 Encounter for general adult medical examination without abnormal findings: Secondary | ICD-10-CM

## 2017-05-20 LAB — LIPID PANEL
CHOL/HDL RATIO: 3
CHOLESTEROL: 148 mg/dL (ref 0–200)
HDL: 46.6 mg/dL (ref >39.00)
LDL CALC: 79 mg/dL (ref 0–99)
NonHDL: 101.74
TRIGLYCERIDES: 115 mg/dL (ref 0.0–149.0)
VLDL: 23 mg/dL (ref 0.0–40.0)

## 2017-05-20 LAB — CBC WITH DIFFERENTIAL/PLATELET
BASOS PCT: 0.2 % (ref 0.0–3.0)
Basophils Absolute: 0 10*3/uL (ref 0.0–0.1)
Eosinophils Absolute: 0.1 10*3/uL (ref 0.0–0.7)
Eosinophils Relative: 2 % (ref 0.0–5.0)
HEMATOCRIT: 44 % (ref 39.0–52.0)
Hemoglobin: 15.2 g/dL (ref 13.0–17.0)
LYMPHS ABS: 2 10*3/uL (ref 0.7–4.0)
Lymphocytes Relative: 34.3 % (ref 12.0–46.0)
MCHC: 34.6 g/dL (ref 30.0–36.0)
MCV: 92.4 fl (ref 78.0–100.0)
MONOS PCT: 7.9 % (ref 3.0–12.0)
Monocytes Absolute: 0.5 10*3/uL (ref 0.1–1.0)
NEUTROS ABS: 3.2 10*3/uL (ref 1.4–7.7)
NEUTROS PCT: 55.6 % (ref 43.0–77.0)
PLATELETS: 162 10*3/uL (ref 150.0–400.0)
RBC: 4.77 Mil/uL (ref 4.22–5.81)
RDW: 13.3 % (ref 11.5–15.5)
WBC: 5.8 10*3/uL (ref 4.0–10.5)

## 2017-05-20 LAB — COMPREHENSIVE METABOLIC PANEL
ALBUMIN: 4.1 g/dL (ref 3.5–5.2)
ALT: 21 U/L (ref 0–53)
AST: 23 U/L (ref 0–37)
Alkaline Phosphatase: 65 U/L (ref 39–117)
BUN: 16 mg/dL (ref 6–23)
CALCIUM: 9.5 mg/dL (ref 8.4–10.5)
CHLORIDE: 102 meq/L (ref 96–112)
CO2: 28 meq/L (ref 19–32)
CREATININE: 0.87 mg/dL (ref 0.40–1.50)
GFR: 90.67 mL/min (ref >60.00)
Glucose, Bld: 109 mg/dL — ABNORMAL HIGH (ref 70–99)
Potassium: 4.3 mEq/L (ref 3.5–5.1)
Sodium: 137 mEq/L (ref 135–145)
Total Bilirubin: 1 mg/dL (ref 0.2–1.2)
Total Protein: 6.8 g/dL (ref 6.0–8.3)

## 2017-05-20 LAB — HEMOGLOBIN A1C: Hgb A1c MFr Bld: 5.7 % (ref 4.6–6.5)

## 2017-06-16 ENCOUNTER — Ambulatory Visit: Payer: Self-pay

## 2017-06-16 NOTE — Telephone Encounter (Signed)
  Reason for Disposition . Cough  Answer Assessment - Initial Assessment Questions 1. ONSET: "When did the cough begin?"      5 days ago 2. SEVERITY: "How bad is the cough today?"      Moderate 3. RESPIRATORY DISTRESS: "Describe your breathing."      No distress 4. FEVER: "Do you have a fever?" If so, ask: "What is your temperature, how was it measured, and when did it start?"     No 5. SPUTUM: "Describe the color of your sputum" (clear, white, yellow, green)     Yellow sputum 6. HEMOPTYSIS: "Are you coughing up any blood?" If so ask: "How much?" (flecks, streaks, tablespoons, etc.)     No 7. CARDIAC HISTORY: "Do you have any history of heart disease?" (e.g., heart attack, congestive heart failure)      Heart attack 8. LUNG HISTORY: "Do you have any history of lung disease?"  (e.g., pulmonary embolus, asthma, emphysema)     No 9. PE RISK FACTORS: "Do you have a history of blood clots?" (or: recent major surgery, recent prolonged travel, bedridden )     No 10. OTHER SYMPTOMS: "Do you have any other symptoms?" (e.g., runny nose, wheezing, chest pain)       Yellow discharge from left eye.Eye is also red. 11. PREGNANCY: "Is there any chance you are pregnant?" "When was your last menstrual period?"       No 12. TRAVEL: "Have you traveled out of the country in the last month?" (e.g., travel history, exposures)       No  Protocols used: [place]

## 2017-06-19 ENCOUNTER — Other Ambulatory Visit: Payer: Self-pay | Admitting: Internal Medicine

## 2017-06-28 ENCOUNTER — Other Ambulatory Visit: Payer: Self-pay | Admitting: Cardiovascular Disease

## 2017-07-23 LAB — PSA: PSA: 5.44

## 2017-07-31 ENCOUNTER — Encounter: Payer: Self-pay | Admitting: Internal Medicine

## 2017-08-04 ENCOUNTER — Encounter: Payer: Self-pay | Admitting: Cardiovascular Disease

## 2017-08-17 NOTE — Progress Notes (Signed)
Patient ID: Benjamin Adkins, male   DOB: 1941/05/31, 77 y.o.   MRN: [id number] This is a 77 y.o.  white male patient f/U CAD  In 2005 he had a perioperative MI after left knee replacement. He required urgent stenting to the RCA and subsequent CABG. He did have a negative treadmill prior to his knee surgery in 2005. He has done well since that time. He has not had any stress test or echo since then. He has a history of a right bundle branch block.his lipids are controlled. He has no history of diabetes and does not smoke.   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  All other systems reviewed and negative  General: BP 128/88   Pulse 63   Ht 5\' 9"  (1.753 m)   Wt 202 lb 3.2 oz (91.7 kg)   SpO2 96%   BMI 29.86 kg/m  Affect appropriate Healthy:  appears stated age 49: normal Neck supple with no adenopathy JVP normal no bruits no thyromegaly Lungs clear with no wheezing and good diaphragmatic motion Heart:  S1/S2 no murmur, no rub, gallop or click PMI normal Abdomen: benighn, BS positve, no tenderness, no AAA no bruit.  No HSM or HJR Distal pulses intact with no bruits No edema Neuro non-focal Skin warm and dry Post bilateral TKR;s     Current Outpatient Medications  Medication Sig Dispense Refill  . amoxicillin (AMOXIL) 500 MG capsule TAKE 4 CAPSULES BY MOUTH AS NEEDED FOR DENTAL PROCEDURE 20 capsule 0  . atorvastatin (LIPITOR) 40 MG tablet Take 1 tablet (40 mg total)  by mouth daily. 90 tablet 1  . celecoxib (CELEBREX) 100 MG capsule Take 1 capsule (100 mg total) by mouth 2 (two) times daily as needed for moderate pain. 180 capsule 1  . metoprolol succinate (TOPROL-XL) 25 MG 24 hr tablet TAKE 1 TABLET BY MOUTH DAILY 90 tablet 0  . Multiple Vitamin (MULTIVITAMIN) capsule Take 1 capsule by mouth daily.      . nitroGLYCERIN (NITROSTAT) 0.4 MG SL tablet Place 1 tablet (0.4 mg total) under the tongue every 5 (five) minutes as needed. 25 tablet 3  . omeprazole (PRILOSEC) 20 MG capsule Take 1 capsule (20 mg total) by mouth daily. 90 capsule 3  . aspirin EC 81 MG tablet Take 1 tablet (81 mg total) by mouth daily.     No current facility-administered medications for this visit.     Allergies  Patient has no known allergies.  Electrocardiogram: SR RBBB LAFB  Old IMI  2013  Today NSR RBBB LAFB  Old IMI no change  05/29/15 SR RBBB old IMI no change 07/25/16  SR rate 64 RBBB LAFB old ILMI 08/19/17 SR rate 26 RBBB LAFB old ILMI  Assessment and Plan CAD:  Stable with no angina and good activity level.  Continue medical Rx last myovue 2013 old IMI no ischemia  He is active at health club and golfing He does not want stress test as he had normal  one right before his MI No indication for cath at this time  Chol:  On statin labs  [facility]  Lab Results  Component Value Date   [place] 79 05/20/2017   GERD:  On prilosec discussed low carb diet  RBBB: chronic no high grade AV block yearly ECG   F/U with me in a year  [employer]

## 2017-08-18 ENCOUNTER — Other Ambulatory Visit: Payer: Self-pay | Admitting: Internal Medicine

## 2017-08-18 NOTE — Telephone Encounter (Signed)
Pt requesting refill on amoxicillin 500mg  for dental procedure. Please advise.

## 2017-08-18 NOTE — Telephone Encounter (Signed)
Rx sent 

## 2017-08-19 ENCOUNTER — Ambulatory Visit: Payer: Medicare Other | Admitting: Cardiovascular Disease

## 2017-08-19 ENCOUNTER — Encounter: Payer: Self-pay | Admitting: Cardiovascular Disease

## 2017-08-19 VITALS — BP 128/88 | HR 63 | Ht 69.0 in | Wt 202.2 lb

## 2017-08-19 DIAGNOSIS — I2510 Atherosclerotic heart disease of native coronary artery without angina pectoris: Secondary | ICD-10-CM

## 2017-08-19 MED ORDER — ASPIRIN EC 81 MG PO TBEC: 81.0000 mg | DELAYED_RELEASE_TABLET | Freq: Every day | ORAL | Status: DC

## 2017-08-19 NOTE — Patient Instructions (Addendum)
Medication Instructions:  Your physician has recommended you make the following change in your medication:  1-STOP Aspirin 325 mg 2-START Aspirin 81 mg by mouth daily   Labwork: NONE  Testing/Procedures: NONE  Follow-Up: Your physician wants you to follow-up in: 6 months with Dr. Johnsie Cancel. You will receive a reminder letter in the mail two months in advance. If you don't receive a letter, please call our office to schedule the follow-up appointment.   If you need a refill on your cardiac medications before your next appointment, please call your pharmacy.

## 2017-09-27 ENCOUNTER — Other Ambulatory Visit: Payer: Self-pay | Admitting: Cardiovascular Disease

## 2017-10-27 LAB — PSA: PSA: 2.57

## 2017-11-10 ENCOUNTER — Encounter: Payer: Self-pay | Admitting: Internal Medicine

## 2017-11-17 NOTE — Progress Notes (Addendum)
Subjective:   Benjamin Adkins is a 77 y.o. male who presents for Medicare Annual/Subsequent preventive examination.  Review of Systems: No ROS.  Medicare Wellness Visit. Additional risk factors are reflected in the social history.  Cardiac Risk Factors include: advanced age (>37men, >25 women);dyslipidemia;hypertension Sleep patterns: Wears CPAP.  Sleeps 8 hrs.  Home Safety/Smoke Alarms: Feels safe in home. Smoke alarms in place.  Living environment; residence and Firearm Safety: 2 story home. Live alone.   Male:   CCS-Due 2022     PSA-  Lab Results  Component Value Date   PSA 2.57 10/27/2017   PSA 5.44 07/23/2017   PSA 4.70 (H) 05/07/2016       Objective:    Vitals: BP 140/72 (BP Location: Left Arm, Patient Position: Sitting, Cuff Size: Normal)   Pulse 65   Ht 5\' 9"  (1.753 m)   Wt 204 lb 9.6 oz (92.8 kg)   SpO2 98%   BMI 30.21 kg/m   Body mass index is 30.21 kg/m.  Advanced Directives 11/19/2017 11/18/2016 02/27/2016 08/19/2011  Does Patient Have a Medical Advance Directive? Yes Yes Yes Patient has advance directive, copy not in chart  Type of Advance Directive Living will;Healthcare Power of [place];Living will Healthcare Power of Attorney Living will  Copy of [place] in Chart? No - copy requested No - copy requested No - copy requested -    Tobacco Social History   Tobacco Use  Smoking Status Former Smoker  . Packs/day: 0.25  . Years: 3.00  . Pack years: 0.75  . Types: Cigarettes  . Start date: 04/12/1959  . Last attempt to quit: 08/12/1961  . Years since quitting: 56.3  Smokeless Tobacco Never Used  Tobacco Comment   during college     Counseling given: Not Answered Comment: during college   Clinical Intake: Pain : No/denies pain   Past Medical History:  Diagnosis Date  . Coronary atherosclerosis of unspecified type of vessel, native or graft    MI 2006, h/o CABG  . DJD (degenerative joint disease)   .  Elevated prostate specific antigen (PSA)   . Esophageal reflux   . HTN (hypertension)   . Hyperlipidemia    nmr lipoprofile 2004: LDL 154 (1653/711), HDL 42, TG 80.NMR  LDL =,125, but PMH  of MI LDL goal=,70  . Hypertension   . Myocardial infarction ([place])    2006  . Other cataract   . Personal history of colonic polyps   . Shortness of breath    DOE  . Sleep apnea    uses CPAP   Past Surgical History:  Procedure Laterality Date  . CARDIAC CATHETERIZATION     2006  . CATARACT EXTRACTION Bilateral   . COLONOSCOPY W/ POLYPECTOMY    . CORONARY ARTERY BYPASS GRAFT  2006   X 4   . HERNIA REPAIR     VENTRAL, LEFT & RIGHT GROIN  . INGUINAL HERNIA REPAIR    . TOTAL KNEE ARTHROPLASTY  2005  . TOTAL KNEE ARTHROPLASTY  2006  . TOTAL KNEE ARTHROPLASTY  08/25/2011   Procedure: TOTAL KNEE ARTHROPLASTY;  Surgeon: Lorn Junes, MD;  Location: [facility];  Service: Orthopedics;  Laterality: Right;  DR Noemi Chapel WANTS 24 MINUTES FOR SURGERY   Family History  Problem Relation Age of Onset  . Cancer Father        stomach  . Stomach cancer Father   . Hypertension Mother   . Breast cancer  Mother   . Coronary artery disease Mother   . Heart disease Mother   . Breast cancer Sister   . Crohn's disease Sister   . Arthritis Sister   . Arthritis Brother   . Heart attack Paternal Grandfather 16  . Colon cancer Neg Hx   . Prostate cancer Neg Hx    Social History   Socioeconomic History  . Marital status: Widowed    Spouse name: Not on file  . Number of children: 1  . Years of education: Not on file  . Highest education level: Not on file  Occupational History  . Occupation: retired  Scientific laboratory technician  . Financial resource strain: Not on file  . Food insecurity:    Worry: Not on file    Inability: Not on file  . Transportation needs:    Medical: Not on file    Non-medical: Not on file  Tobacco Use  . Smoking status: Former Smoker    Packs/day: 0.25    Years: 3.00    Pack years: 0.75     Types: Cigarettes    Start date: 04/12/1959    Last attempt to quit: 08/12/1961    Years since quitting: 56.3  . Smokeless tobacco: Never Used  . Tobacco comment: during college  Substance and Sexual Activity  . Alcohol use: Yes    Alcohol/week: 4.2 oz    Types: 4 Glasses of wine, 3 Cans of beer per week    Comment: Social  . Drug use: No  . Sexual activity: Yes  Lifestyle  . Physical activity:    Days per week: Not on file    Minutes per session: Not on file  . Stress: Not on file  Relationships  . Social connections:    Talks on phone: Not on file    Gets together: Not on file    Attends religious service: Not on file    Active member of club or organization: Not on file    Attends meetings of clubs or organizations: Not on file    Relationship status: Not on file  Other Topics Concern  . Not on file  Social History Narrative   Lives alone. Has a daughter in [place] , [place] [phone]    Outpatient Encounter Medications as of 11/19/2017  Medication Sig  . amoxicillin (AMOXIL) 500 MG capsule TAKE 4 CAPSULES BY MOUTH AS NEEDED FOR DENTAL PROCEDURE  . aspirin EC 81 MG tablet Take 1 tablet (81 mg total) by mouth daily.  Marland Kitchen atorvastatin (LIPITOR) 40 MG tablet Take 1 tablet (40 mg total) by mouth daily.  . celecoxib (CELEBREX) 100 MG capsule Take 1 capsule (100 mg total) by mouth 2 (two) times daily as needed for moderate pain.  . metoprolol succinate (TOPROL-XL) 25 MG 24 hr tablet Take 1 tablet (25 mg total) by mouth daily.  . Multiple Vitamin (MULTIVITAMIN) capsule Take 1 capsule by mouth daily.    . nitroGLYCERIN (NITROSTAT) 0.4 MG SL tablet Place 1 tablet (0.4 mg total) under the tongue every 5 (five) minutes as needed.  Marland Kitchen omeprazole (PRILOSEC) 20 MG capsule Take 1 capsule (20 mg total) by mouth daily.   No facility-administered encounter medications on file as of 11/19/2017.     Activities of Daily Living In your present state of health, do you have any  difficulty performing the following activities: 11/19/2017  Hearing? N  Vision? N  Comment wears glasses.   Difficulty concentrating or making decisions? N  Walking or climbing stairs? N  Dressing or bathing? N  Doing errands, shopping? N  Preparing Food and eating ? N  Using the Toilet? N  In the past six months, have you accidently leaked urine? N  Do you have problems with loss of bowel control? N  Managing your Medications? N  Managing your Finances? N  Housekeeping or managing your Housekeeping? N  Some recent data might be hidden    Patient Care Team: Colon Branch, MD as PCP - General (Internal Medicine) Josue Hector, MD as Consulting Physician (Cardiology) Volanda Napoleon, MD as Consulting Physician (Oncology) Ripley Fraise, MD as Referring Physician (Dermatology) Nickie Retort, MD as Consulting Physician (Urology)   Assessment:   This is a routine wellness examination for Benjamin Adkins. Physical assessment deferred to PCP.  Exercise Activities and Dietary recommendations Current Exercise Habits: Home exercise routine;Structured exercise class, Type of exercise: treadmill;strength training/weights, Time (Minutes): 60, Frequency (Times/Week): 3, Intensity: Mild, Exercise limited by: None identified   Diet (meal preparation, eat out, water intake, caffeinated beverages, dairy products, fruits and vegetables): well balanced    Goals    . Weight (lb) < 195 lb (88.5 kg)       Fall Risk Fall Risk  11/19/2017 11/18/2016 05/06/2016 12/26/2015 04/23/2015  Falls in the past year? No No No No No    Depression Screen PHQ 2/9 Scores 11/18/2016 05/06/2016 12/26/2015 04/23/2015  PHQ - 2 Score 0 0 0 0    Cognitive Function Ad8 score reviewed for issues:  Issues making decisions:no  Less interest in hobbies / activities:no  Repeats questions, stories (family complaining):no  Trouble using ordinary gadgets (microwave, computer, phone):no  Forgets the month or year:  no  Mismanaging finances: no  Remembering appts:no  Daily problems with thinking and/or memory:no Ad8 score is=0          Immunization History  Administered Date(s) Administered  . Influenza Split 05/27/2011, 04/02/2012  . Influenza Whole 05/04/2007, 05/18/2008, 05/22/2009, 05/16/2010  . Influenza, High Dose Seasonal PF 04/28/2013, 04/23/2015, 04/10/2016  . Influenza,inj,Quad PF,6+ Mos 08/10/2014  . Influenza-Unspecified 04/28/2017  . Pneumococcal Conjugate-13 04/23/2015  . Pneumococcal Polysaccharide-23 08/09/2013  . Tetanus 08/23/2013  . Zoster 07/02/2011    Screening Tests Health Maintenance  Topic Date Due  . INFLUENZA VACCINE  02/18/2018  . TETANUS/TDAP  08/24/2023  . PNA vac Low Risk Adult  Completed      Plan:     Follow up with Dr.Paz as directed  Continue to eat heart healthy diet (full of fruits, vegetables, whole grains, lean protein, water--limit salt, fat, and sugar intake) and increase physical activity as tolerated.  Bring a copy of your living will and/or healthcare power of attorney to your next office visit.  I have personally reviewed and noted the following in the patient's chart:   . Medical and social history . Use of alcohol, tobacco or illicit drugs  . Current medications and supplements . Functional ability and status . Nutritional status . Physical activity . Advanced directives . List of other physicians . Hospitalizations, surgeries, and ER visits in previous 12 months . Vitals . Screenings to include cognitive, depression, and falls . Referrals and appointments  In addition, I have reviewed and discussed with patient certain preventive protocols, quality metrics, and best practice recommendations. A written personalized care plan for preventive services as well as general preventive health recommendations were provided to patient.     Benjamin Adkins, [place]  11/19/2017 Benjamin November, MD

## 2017-11-19 ENCOUNTER — Ambulatory Visit (INDEPENDENT_AMBULATORY_CARE_PROVIDER_SITE_OTHER): Payer: Medicare Other | Admitting: *Deleted

## 2017-11-19 ENCOUNTER — Encounter: Payer: Self-pay | Admitting: *Deleted

## 2017-11-19 VITALS — BP 140/72 | HR 65 | Ht 69.0 in | Wt 204.6 lb

## 2017-11-19 DIAGNOSIS — Z0000 Encounter for general adult medical examination without abnormal findings: Secondary | ICD-10-CM

## 2017-11-19 NOTE — Patient Instructions (Signed)
Continue to eat heart healthy diet (full of fruits, vegetables, whole grains, lean protein, water--limit salt, fat, and sugar intake) and increase physical activity as tolerated.  Bring a copy of your living will and/or healthcare power of attorney to your next office visit.   Mr. Benjamin Adkins , Thank you for taking time to come for your Medicare Wellness Visit. I appreciate your ongoing commitment to your health goals. Please review the following plan we discussed and let me know if I can assist you in the future.   These are the goals we discussed: Goals    . Weight (lb) < 195 lb (88.5 kg)       This is a list of the screening recommended for you and due dates:  Health Maintenance  Topic Date Due  . Flu Shot  02/18/2018  . Tetanus Vaccine  08/24/2023  . Pneumonia vaccines  Completed    Health Maintenance, Male A healthy lifestyle and preventive care is important for your health and wellness. Ask your health care provider about what schedule of regular examinations is right for you. What should I know about weight and diet? Eat a Healthy Diet  Eat plenty of vegetables, fruits, whole grains, low-fat dairy products, and lean protein.  Do not eat a lot of foods high in solid fats, added sugars, or salt.  Maintain a Healthy Weight Regular exercise can help you achieve or maintain a healthy weight. You should:  Do at least 150 minutes of exercise each week. The exercise should increase your heart rate and make you sweat (moderate-intensity exercise).  Do strength-training exercises at least twice a week.  Watch Your Levels of Cholesterol and Blood Lipids  Have your blood tested for lipids and cholesterol every 5 years starting at 77 years of age. If you are at high risk for heart disease, you should start having your blood tested when you are 77 years old. You may need to have your cholesterol levels checked more often if: ? Your lipid or cholesterol levels are high. ? You are older  than 77 years of age. ? You are at high risk for heart disease.  What should I know about cancer screening? Many types of cancers can be detected early and may often be prevented. Lung Cancer  You should be screened every year for lung cancer if: ? You are a current smoker who has smoked for at least 30 years. ? You are a former smoker who has quit within the past 15 years.  Talk to your health care provider about your screening options, when you should start screening, and how often you should be screened.  Colorectal Cancer  Routine colorectal cancer screening usually begins at 77 years of age and should be repeated every 5-10 years until you are 77 years old. You may need to be screened more often if early forms of precancerous polyps or small growths are found. Your health care provider may recommend screening at an earlier age if you have risk factors for colon cancer.  Your health care provider may recommend using home test kits to check for hidden blood in the stool.  A small camera at the end of a tube can be used to examine your colon (sigmoidoscopy or colonoscopy). This checks for the earliest forms of colorectal cancer.  Prostate and Testicular Cancer  Depending on your age and overall health, your health care provider may do certain tests to screen for prostate and testicular cancer.  Talk to your health care  provider about any symptoms or concerns you have about testicular or prostate cancer.  Skin Cancer  Check your skin from head to toe regularly.  Tell your health care provider about any new moles or changes in moles, especially if: ? There is a change in a mole's size, shape, or color. ? You have a mole that is larger than a pencil eraser.  Always use sunscreen. Apply sunscreen liberally and repeat throughout the day.  Protect yourself by wearing long sleeves, pants, a wide-brimmed hat, and sunglasses when outside.  What should I know about heart disease,  diabetes, and high blood pressure?  If you are 29-57 years of age, have your blood pressure checked every 3-5 years. If you are 62 years of age or older, have your blood pressure checked every year. You should have your blood pressure measured twice-once when you are at a hospital or clinic, and once when you are not at a hospital or clinic. Record the average of the two measurements. To check your blood pressure when you are not at a hospital or clinic, you can use: ? An automated blood pressure machine at a pharmacy. ? A home blood pressure monitor.  Talk to your health care provider about your target blood pressure.  If you are between 14-79 years old, ask your health care provider if you should take aspirin to prevent heart disease.  Have regular diabetes screenings by checking your fasting blood sugar level. ? If you are at a normal weight and have a low risk for diabetes, have this test once every three years after the age of 32. ? If you are overweight and have a high risk for diabetes, consider being tested at a younger age or more often.  A one-time screening for abdominal aortic aneurysm (AAA) by ultrasound is recommended for men aged 77-75 years who are current or former smokers. What should I know about preventing infection? Hepatitis B If you have a higher risk for hepatitis B, you should be screened for this virus. Talk with your health care provider to find out if you are at risk for hepatitis B infection. Hepatitis C Blood testing is recommended for:  Everyone born from [birth year] through 1965.  Anyone with known risk factors for hepatitis C.  Sexually Transmitted Diseases (STDs)  You should be screened each year for STDs including gonorrhea and chlamydia if: ? You are sexually active and are younger than 77 years of age. ? You are older than 77 years of age and your health care provider tells you that you are at risk for this type of infection. ? Your sexual activity has  changed since you were last screened and you are at an increased risk for chlamydia or gonorrhea. Ask your health care provider if you are at risk.  Talk with your health care provider about whether you are at high risk of being infected with HIV. Your health care provider may recommend a prescription medicine to help prevent HIV infection.  What else can I do?  Schedule regular health, dental, and eye exams.  Stay current with your vaccines (immunizations).  Do not use any tobacco products, such as cigarettes, chewing tobacco, and e-cigarettes. If you need help quitting, ask your health care provider.  Limit alcohol intake to no more than 2 drinks per day. One drink equals 12 ounces of beer, 5 ounces of wine, or 1 ounces of hard liquor.  Do not use street drugs.  Do not share needles.  Ask  your health care provider for help if you need support or information about quitting drugs.  Tell your health care provider if you often feel depressed.  Tell your health care provider if you have ever been abused or do not feel safe at home. This information is not intended to replace advice given to you by your health care provider. Make sure you discuss any questions you have with your health care provider. Document Released: 01/03/2008 Document Revised: 03/05/2016 Document Reviewed: 04/10/2015 Elsevier Interactive Patient Education  [employer].

## 2017-12-26 ENCOUNTER — Other Ambulatory Visit: Payer: Self-pay | Admitting: Internal Medicine

## 2018-01-14 ENCOUNTER — Encounter: Payer: Self-pay | Admitting: Internal Medicine

## 2018-01-14 ENCOUNTER — Ambulatory Visit (INDEPENDENT_AMBULATORY_CARE_PROVIDER_SITE_OTHER): Payer: Medicare Other | Admitting: Internal Medicine

## 2018-01-14 VITALS — BP 126/78 | HR 56 | Temp 98.3°F | Resp 16 | Ht 69.0 in | Wt 199.5 lb

## 2018-01-14 DIAGNOSIS — I10 Essential (primary) hypertension: Secondary | ICD-10-CM

## 2018-01-14 DIAGNOSIS — I25810 Atherosclerosis of coronary artery bypass graft(s) without angina pectoris: Secondary | ICD-10-CM

## 2018-01-14 LAB — BASIC METABOLIC PANEL
BUN: 15 mg/dL (ref 6–23)
CALCIUM: 9.3 mg/dL (ref 8.4–10.5)
CHLORIDE: 104 meq/L (ref 96–112)
CO2: 28 mEq/L (ref 19–32)
CREATININE: 0.92 mg/dL (ref 0.40–1.50)
GFR: 84.86 mL/min (ref >60.00)
Glucose, Bld: 112 mg/dL — ABNORMAL HIGH (ref 70–99)
Potassium: 4.4 mEq/L (ref 3.5–5.1)
SODIUM: 137 meq/L (ref 135–145)

## 2018-01-14 NOTE — Progress Notes (Signed)
Pre visit review using our clinic review tool, if applicable. No additional management support is needed unless otherwise documented below in the visit note. 

## 2018-01-14 NOTE — Patient Instructions (Signed)
GO TO THE LAB : Get the blood work     GO TO THE FRONT DESK Schedule your next appointment for a physical exam in 4-5 months, fasting

## 2018-01-14 NOTE — Progress Notes (Signed)
Subjective:    Patient ID: Benjamin Adkins, male    DOB: [birth year], 77 y.o.   MRN: [id number]  DOS:  01/14/2018 Type of visit - description : ROV Interval history:  No concerns CAD: Saw cardiology, felt to be stable Saw urology, note reviewed, was recommended to come back 6 months later with a PSA. HTN, no ambulatory BPs   Review of Systems Denies chest pain or difficulty breathing  he remains very active  Past Medical History:  Diagnosis Date  . Coronary atherosclerosis of unspecified type of vessel, native or graft    MI 2006, h/o CABG  . DJD (degenerative joint disease)   . Elevated prostate specific antigen (PSA)   . Esophageal reflux   . HTN (hypertension)   . Hyperlipidemia    nmr lipoprofile 2004: LDL 154 (1653/711), HDL 42, TG 80.NMR  LDL =,125, but PMH  of MI LDL goal=,70  . Hypertension   . Myocardial infarction ([place])    2006  . Other cataract   . Personal history of colonic polyps   . Shortness of breath    DOE  . Sleep apnea    uses CPAP    Past Surgical History:  Procedure Laterality Date  . CARDIAC CATHETERIZATION     2006  . CATARACT EXTRACTION Bilateral   . COLONOSCOPY W/ POLYPECTOMY    . CORONARY ARTERY BYPASS GRAFT  2006   X 4   . HERNIA REPAIR     VENTRAL, LEFT & RIGHT GROIN  . INGUINAL HERNIA REPAIR    . TOTAL KNEE ARTHROPLASTY  2005  . TOTAL KNEE ARTHROPLASTY  2006  . TOTAL KNEE ARTHROPLASTY  08/25/2011   Procedure: TOTAL KNEE ARTHROPLASTY;  Surgeon: Lorn Junes, MD;  Location: [facility];  Service: Orthopedics;  Laterality: Right;  DR [place]    Social History   Socioeconomic History  . Marital status: Widowed    Spouse name: Not on file  . Number of children: 1  . Years of education: Not on file  . Highest education level: Not on file  Occupational History  . Occupation: retired  Scientific laboratory technician  . Financial resource strain: Not on file  . Food insecurity:    Worry: Not on file    Inability: Not on file    . Transportation needs:    Medical: Not on file    Non-medical: Not on file  Tobacco Use  . Smoking status: Former Smoker    Packs/day: 0.25    Years: 3.00    Pack years: 0.75    Types: Cigarettes    Start date: 04/12/1959    Last attempt to quit: 08/12/1961    Years since quitting: 56.4  . Smokeless tobacco: Never Used  . Tobacco comment: during college  Substance and Sexual Activity  . Alcohol use: Yes    Alcohol/week: 4.2 oz    Types: 4 Glasses of wine, 3 Cans of beer per week    Comment: Social  . Drug use: No  . Sexual activity: Yes  Lifestyle  . Physical activity:    Days per week: Not on file    Minutes per session: Not on file  . Stress: Not on file  Relationships  . Social connections:    Talks on phone: Not on file    Gets together: Not on file    Attends religious service: Not on file    Active member of club or organization: Not on file  Attends meetings of clubs or organizations: Not on file    Relationship status: Not on file  . Intimate partner violence:    Fear of current or ex partner: Not on file    Emotionally abused: Not on file    Physically abused: Not on file    Forced sexual activity: Not on file  Other Topics Concern  . Not on file  Social History Narrative   Lives alone. Has a daughter in [place] , [place] [phone]      Allergies as of 01/14/2018   No Known Allergies     Medication List        Accurate as of 01/14/18 11:59 PM. Always use your most recent med list.          amoxicillin 500 MG capsule Commonly known as:  AMOXIL TAKE 4 CAPSULES BY MOUTH AS NEEDED FOR DENTAL PROCEDURE   aspirin EC 81 MG tablet Take 1 tablet (81 mg total) by mouth daily.   atorvastatin 40 MG tablet Commonly known as:  LIPITOR TAKE 1 TABLET BY MOUTH EVERY DAY   celecoxib 100 MG capsule Commonly known as:  CELEBREX Take 1 capsule (100 mg total) by mouth 2 (two) times daily as needed for moderate pain.   metoprolol succinate  25 MG 24 hr tablet Commonly known as:  TOPROL-XL Take 1 tablet (25 mg total) by mouth daily.   multivitamin capsule Take 1 capsule by mouth daily.   nitroGLYCERIN 0.4 MG SL tablet Commonly known as:  NITROSTAT Place 1 tablet (0.4 mg total) under the tongue every 5 (five) minutes as needed.   omeprazole 20 MG capsule Commonly known as:  PRILOSEC Take 1 capsule (20 mg total) by mouth daily.          Objective:   Physical Exam BP 126/78 (BP Location: Left Arm, Patient Position: Sitting, Cuff Size: Small)   Pulse (!) 56   Temp 98.3 F (36.8 C) (Oral)   Resp 16   Ht 5\' 9"  (1.753 m)   Wt 199 lb 8 oz (90.5 kg)   SpO2 96%   BMI 29.46 kg/m  General:   Well developed, NAD, see BMI.  HEENT:  Normocephalic . Face symmetric, atraumatic Lungs:  CTA B Normal respiratory effort, no intercostal retractions, no accessory muscle use. Heart: RRR,  no murmur.  No pretibial edema bilaterally  Skin: Not pale. Not jaundice Neurologic:  alert & oriented X3.  Speech normal, gait appropriate for age and unassisted Psych--  Cognition and judgment appear intact.  Cooperative with normal attention span and concentration.  Behavior appropriate. No anxious or depressed appearing.      Assessment & Plan:   Assessment  Prediabetes HTN Hyperlipidemia CAD, MI 2006, CABG, RBBB OSA , on CPAP DJD Knee replacement-- on abx pre-dental procedures  Elevated PSA  PLAN: HTN: Seems controlled on Toprol, he takes Celebrex on and off, check a BMP Hyperlipidemia: Controlled, continue Lipitor. CAD: Note from cardiology reviewed, stable. Elevated PSA, saw urology, was rec  to go back 6 months later. RTC 4 to 5 months CPX

## 2018-01-15 NOTE — Assessment & Plan Note (Signed)
HTN: Seems controlled on Toprol, he takes Celebrex on and off, check a BMP Hyperlipidemia: Controlled, continue Lipitor. CAD: Note from cardiology reviewed, stable. Elevated PSA, saw urology, was rec  to go back 6 months later. RTC 4 to 5 months CPX

## 2018-03-24 ENCOUNTER — Ambulatory Visit: Payer: Medicare Other | Admitting: Cardiovascular Disease

## 2018-04-28 LAB — PSA: PSA: 4.07

## 2018-04-30 ENCOUNTER — Other Ambulatory Visit: Payer: Self-pay | Admitting: Internal Medicine

## 2018-05-21 NOTE — Progress Notes (Signed)
Patient ID: Benjamin Adkins, male   DOB: 1941-07-16, 77 y.o.   MRN: [id number]     This is a 77 y.o.  white male patient f/U CAD  In 2005 he had a perioperative MI after left knee replacement. He required urgent stenting to the RCA and subsequent CABG. He did have a negative treadmill prior to his knee surgery in 2005. He has done well since that time. He has not had any stress test or echo since then. He has a history of a right bundle branch block.his lipids are controlled. He has no history of diabetes and does not smoke.   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  All other systems reviewed and negative  General: BP 132/84   Pulse 60   Ht 5\' 9"  (1.753 m)   Wt 201 lb 12 oz (91.5 kg)   SpO2 94%   BMI 29.79 kg/m  Affect appropriate Healthy:  appears stated age 77: normal Neck supple with no adenopathy JVP normal no bruits no thyromegaly Lungs clear with no wheezing and good diaphragmatic motion Heart:  S1/S2 no murmur, no rub, gallop or click PMI normal Abdomen: benighn, BS positve, no tenderness, no AAA no bruit.  No HSM or HJR Distal pulses intact with no bruits No edema Neuro non-focal Skin warm and dry No muscular weakness Post bilateral TKR;s     Current Outpatient Medications  Medication Sig Dispense Refill  . amoxicillin (AMOXIL) 500 MG capsule TAKE 4 CAPSULES BY MOUTH AS NEEDED FOR DENTAL PROCEDURE 20 capsule 0  . aspirin 325 MG EC tablet Take 325  mg by mouth daily.    Marland Kitchen atorvastatin (LIPITOR) 40 MG tablet Take 1 tablet (40 mg total) by mouth daily. 90 tablet 1  . celecoxib (CELEBREX) 100 MG capsule Take 1 capsule (100 mg total) by mouth 2 (two) times daily as needed for moderate pain. 180 capsule 1  . metoprolol succinate (TOPROL-XL) 25 MG 24 hr tablet Take 1 tablet (25 mg total) by mouth daily. 90 tablet 2  . Multiple Vitamin (MULTIVITAMIN) capsule Take 1 capsule by mouth daily.      . nitroGLYCERIN (NITROSTAT) 0.4 MG SL tablet Place 1 tablet (0.4 mg total) under the tongue every 5 (five) minutes as needed. 25 tablet 3  . omeprazole (PRILOSEC) 20 MG capsule Take 1 capsule (20 mg total) by mouth daily before breakfast. 90 capsule 3   No current facility-administered medications for this visit.     Allergies  Patient has no known allergies.  Electrocardiogram:   08/19/17 SR rate 72 RBBB LAFB old ILMI  Assessment and Plan CAD:  Stable with no angina and good activity level.  Continue medical Rx last myovue 2013 old IMI no ischemia  He is active at health club and golfing He does not want stress test as he had normal one right before his MI No indication for cath at this time   Chol:  On statin labs with primary target LDL less  than 70   GERD:  On prilosec discussed low carb diet   RBBB: chronic no high grade AV block yearly ECG   F/U with me in a year  [employer]

## 2018-05-25 ENCOUNTER — Encounter: Payer: Self-pay | Admitting: Internal Medicine

## 2018-05-31 ENCOUNTER — Other Ambulatory Visit: Payer: Self-pay | Admitting: Internal Medicine

## 2018-05-31 ENCOUNTER — Ambulatory Visit: Payer: Medicare Other | Admitting: Cardiovascular Disease

## 2018-05-31 ENCOUNTER — Encounter: Payer: Self-pay | Admitting: Cardiovascular Disease

## 2018-05-31 VITALS — BP 132/84 | HR 60 | Ht 69.0 in | Wt 201.8 lb

## 2018-05-31 DIAGNOSIS — I2510 Atherosclerotic heart disease of native coronary artery without angina pectoris: Secondary | ICD-10-CM

## 2018-05-31 NOTE — Patient Instructions (Signed)

## 2018-06-22 ENCOUNTER — Other Ambulatory Visit: Payer: Self-pay | Admitting: Internal Medicine

## 2018-07-25 ENCOUNTER — Other Ambulatory Visit: Payer: Self-pay | Admitting: Cardiovascular Disease

## 2018-08-03 ENCOUNTER — Ambulatory Visit (INDEPENDENT_AMBULATORY_CARE_PROVIDER_SITE_OTHER): Payer: Medicare Other | Admitting: Internal Medicine

## 2018-08-03 ENCOUNTER — Encounter: Payer: Self-pay | Admitting: Internal Medicine

## 2018-08-03 VITALS — BP 126/72 | HR 65 | Temp 98.2°F | Resp 16 | Ht 69.0 in | Wt 204.0 lb

## 2018-08-03 DIAGNOSIS — R739 Hyperglycemia, unspecified: Secondary | ICD-10-CM

## 2018-08-03 DIAGNOSIS — E785 Hyperlipidemia, unspecified: Secondary | ICD-10-CM

## 2018-08-03 DIAGNOSIS — Z0000 Encounter for general adult medical examination without abnormal findings: Secondary | ICD-10-CM | POA: Diagnosis not present

## 2018-08-03 MED ORDER — ZOSTER VAC RECOMB ADJUVANTED 50 MCG/0.5ML IM SUSR: 0.5000 mL | Freq: Once | INTRAMUSCULAR | 1 refills | Status: AC

## 2018-08-03 NOTE — Assessment & Plan Note (Addendum)
-  Tdap: 08/2103 ; PNA 23: 07/2013 ; prevnar:04-2015; zostavax:06/2011; [facility]? rec to check records .  Had a Flu shot  -CCS:  cscope 02/2006 Diverticulosis cscope 04-2016- 1 polyp, 5 years   -prostate ca screening: PSA is elevated, sees urology regularly -labs : RTC fasting for BMP, AST, ALT, FLP, CBC, A1c - He remains active playing golf, goes to the gym regularly.

## 2018-08-03 NOTE — Patient Instructions (Addendum)
  GO TO THE FRONT DESK Schedule your next appointment for a  Check up in 6-8 months   Schedule labs to be done tomorrow    Please check with [facility]: You had Zostavax in 2012 Did you get SHINGREX (2 doses)?

## 2018-08-03 NOTE — Assessment & Plan Note (Signed)
Prediabetes, HTN, hyperlipidemia, CAD: All seem stable, saw cardiology 05-2018, no changes made.  Continue current medications, checking labs today. OSA: Reports good use of CPAP RTC 6 to 8 months

## 2018-08-03 NOTE — Progress Notes (Signed)
Subjective:    Patient ID: Benjamin Adkins, male    DOB: 1941-07-11, 78 y.o.   MRN: [id number]  DOS:  08/03/2018 Type of visit - description: CPX No concerns  Review of Systems   A 14 point review of systems is negative    Past Medical History:  Diagnosis Date  . Coronary atherosclerosis of unspecified type of vessel, native or graft    MI 2006, h/o CABG  . DJD (degenerative joint disease)   . Elevated prostate specific antigen (PSA)   . Elevated PSA   . Esophageal reflux   . HTN (hypertension)   . Hyperlipidemia    nmr lipoprofile 2004: LDL 154 (1653/711), HDL 42, TG 80.NMR  LDL =,125, but PMH  of MI LDL goal=,70  . Hypertension   . Myocardial infarction ([place])    2006  . Other cataract   . Personal history of colonic polyps   . Shortness of breath    DOE  . Sleep apnea    uses CPAP    Past Surgical History:  Procedure Laterality Date  . CARDIAC CATHETERIZATION     2006  . CATARACT EXTRACTION Bilateral   . COLONOSCOPY W/ POLYPECTOMY    . CORONARY ARTERY BYPASS GRAFT  2006   X 4   . HERNIA REPAIR     VENTRAL, LEFT & RIGHT GROIN  . INGUINAL HERNIA REPAIR    . TOTAL KNEE ARTHROPLASTY  2005  . TOTAL KNEE ARTHROPLASTY  2006  . TOTAL KNEE ARTHROPLASTY  08/25/2011   Procedure: TOTAL KNEE ARTHROPLASTY;  Surgeon: Lorn Junes, MD;  Location: [facility];  Service: Orthopedics;  Laterality: Right;  DR [place]    Social History   Socioeconomic History  . Marital status: Widowed    Spouse name: Not on file  . Number of children: 1  . Years of education: Not on file  . Highest education level: Not on file  Occupational History  . Occupation: retired  Scientific laboratory technician  . Financial resource strain: Not on file  . Food insecurity:    Worry: Not on file    Inability: Not on file  . Transportation needs:    Medical: Not on file    Non-medical: Not on file  Tobacco Use  . Smoking status: Former Smoker    Packs/day: 0.25    Years: 3.00    Pack  years: 0.75    Types: Cigarettes    Start date: 04/12/1959    Last attempt to quit: 08/12/1961    Years since quitting: 57.0  . Smokeless tobacco: Never Used  . Tobacco comment: during college  Substance and Sexual Activity  . Alcohol use: Yes    Alcohol/week: 7.0 standard drinks    Types: 4 Glasses of wine, 3 Cans of beer per week    Comment: Social  . Drug use: No  . Sexual activity: Yes  Lifestyle  . Physical activity:    Days per week: Not on file    Minutes per session: Not on file  . Stress: Not on file  Relationships  . Social connections:    Talks on phone: Not on file    Gets together: Not on file    Attends religious service: Not on file    Active member of club or organization: Not on file    Attends meetings of clubs or organizations: Not on file    Relationship status: Not on file  . Intimate partner violence:  Fear of current or ex partner: Not on file    Emotionally abused: Not on file    Physically abused: Not on file    Forced sexual activity: Not on file  Other Topics Concern  . Not on file  Social History Narrative   Lives alone. Has a daughter in [place] , [place] G-son   Daughter: Tamera Reason [phone]     Family History  Problem Relation Age of Onset  . Cancer Father        stomach  . Stomach cancer Father   . Hypertension Mother   . Breast cancer Mother   . Coronary artery disease Mother   . Heart disease Mother   . Breast cancer Sister   . Crohn's disease Sister   . Arthritis Sister   . Arthritis Brother   . Heart attack Paternal Grandfather 60  . Colon cancer Neg Hx   . Prostate cancer Neg Hx      Allergies as of 08/03/2018   No Known Allergies     Medication List       Accurate as of August 03, 2018  6:32 PM. Always use your most recent med list.        amoxicillin 500 MG capsule Commonly known as:  AMOXIL TAKE 4 CAPSULES BY MOUTH AS NEEDED FOR DENTAL PROCEDURE   aspirin 325 MG EC tablet Take 325 mg by mouth daily.     atorvastatin 40 MG tablet Commonly known as:  LIPITOR Take 1 tablet (40 mg total) by mouth daily.   celecoxib 100 MG capsule Commonly known as:  CELEBREX Take 1 capsule (100 mg total) by mouth 2 (two) times daily as needed for moderate pain.   metoprolol succinate 25 MG 24 hr tablet Commonly known as:  TOPROL-XL TAKE 1 TABLET BY MOUTH EVERY DAY   multivitamin capsule Take 1 capsule by mouth daily.   nitroGLYCERIN 0.4 MG SL tablet Commonly known as:  NITROSTAT Place 1 tablet (0.4 mg total) under the tongue every 5 (five) minutes as needed.   omeprazole 20 MG capsule Commonly known as:  PRILOSEC Take 1 capsule (20 mg total) by mouth daily before breakfast.   Zoster Vaccine Adjuvanted injection Commonly known as:  SHINGRIX Inject 0.5 mLs into the muscle once for 1 dose.           Objective:   Physical Exam BP 126/72 (BP Location: Left Arm, Patient Position: Sitting, Cuff Size: Normal)   Pulse 65   Temp 98.2 F (36.8 C) (Oral)   Resp 16   Ht 5\' 9"  (1.753 m)   Wt 204 lb (92.5 kg)   SpO2 95%   BMI 30.13 kg/m  General: Well developed, NAD, BMI noted Neck: No  thyromegaly  HEENT:  Normocephalic . Face symmetric, atraumatic Lungs:  CTA B Normal respiratory effort, no intercostal retractions, no accessory muscle use. Heart: RRR,  no murmur.  No pretibial edema bilaterally  Abdomen:  Not distended, soft, non-tender. No rebound or rigidity.   Skin: Exposed areas without rash. Not pale. Not jaundice Neurologic:  alert & oriented X3.  Speech normal, gait appropriate for age and unassisted Strength symmetric and appropriate for age.  Psych: Cognition and judgment appear intact.  Cooperative with normal attention span and concentration.  Behavior appropriate. No anxious or depressed appearing.     Assessment     Assessment  Prediabetes HTN Hyperlipidemia CAD, MI 2006, CABG, RBBB OSA , on CPAP DJD Knee replacement-- on abx pre-dental procedures  Elevated  PSA  PLAN: Prediabetes, HTN, hyperlipidemia, CAD: All seem stable, saw cardiology 05-2018, no changes made.  Continue current medications, checking labs today. OSA: Reports good use of CPAP RTC 6 to 8 months

## 2018-08-03 NOTE — Progress Notes (Signed)
Pre visit review using our clinic review tool, if applicable. No additional management support is needed unless otherwise documented below in the visit note. 

## 2018-08-04 ENCOUNTER — Other Ambulatory Visit (INDEPENDENT_AMBULATORY_CARE_PROVIDER_SITE_OTHER): Payer: Medicare Other

## 2018-08-04 DIAGNOSIS — Z0000 Encounter for general adult medical examination without abnormal findings: Secondary | ICD-10-CM | POA: Diagnosis not present

## 2018-08-04 DIAGNOSIS — E785 Hyperlipidemia, unspecified: Secondary | ICD-10-CM

## 2018-08-04 DIAGNOSIS — R739 Hyperglycemia, unspecified: Secondary | ICD-10-CM

## 2018-08-04 LAB — AST: AST: 24 U/L (ref 0–37)

## 2018-08-04 LAB — CBC WITH DIFFERENTIAL/PLATELET
BASOS ABS: 0 10*3/uL (ref 0.0–0.1)
Basophils Relative: 0.3 % (ref 0.0–3.0)
Eosinophils Absolute: 0.1 10*3/uL (ref 0.0–0.7)
Eosinophils Relative: 1.8 % (ref 0.0–5.0)
HCT: 43.9 % (ref 39.0–52.0)
Hemoglobin: 15.2 g/dL (ref 13.0–17.0)
LYMPHS ABS: 1.7 10*3/uL (ref 0.7–4.0)
Lymphocytes Relative: 29.3 % (ref 12.0–46.0)
MCHC: 34.7 g/dL (ref 30.0–36.0)
MCV: 91 fl (ref 78.0–100.0)
Monocytes Absolute: 0.5 10*3/uL (ref 0.1–1.0)
Monocytes Relative: 7.8 % (ref 3.0–12.0)
NEUTROS ABS: 3.6 10*3/uL (ref 1.4–7.7)
Neutrophils Relative %: 60.8 % (ref 43.0–77.0)
Platelets: 152 10*3/uL (ref 150.0–400.0)
RBC: 4.82 Mil/uL (ref 4.22–5.81)
RDW: 13 % (ref 11.5–15.5)
WBC: 6 10*3/uL (ref 4.0–10.5)

## 2018-08-04 LAB — ALT: ALT: 20 U/L (ref 0–53)

## 2018-08-04 LAB — BASIC METABOLIC PANEL
BUN: 15 mg/dL (ref 6–23)
CHLORIDE: 103 meq/L (ref 96–112)
CO2: 27 mEq/L (ref 19–32)
Calcium: 9.4 mg/dL (ref 8.4–10.5)
Creatinine, Ser: 0.97 mg/dL (ref 0.40–1.50)
GFR: 79.71 mL/min (ref >60.00)
Glucose, Bld: 113 mg/dL — ABNORMAL HIGH (ref 70–99)
POTASSIUM: 4.3 meq/L (ref 3.5–5.1)
Sodium: 136 mEq/L (ref 135–145)

## 2018-08-04 LAB — LIPID PANEL
Cholesterol: 163 mg/dL (ref 0–200)
HDL: 49.5 mg/dL (ref >39.00)
LDL Cholesterol: 92 mg/dL (ref 0–99)
NonHDL: 113.99
TRIGLYCERIDES: 110 mg/dL (ref 0.0–149.0)
Total CHOL/HDL Ratio: 3
VLDL: 22 mg/dL (ref 0.0–40.0)

## 2018-08-04 LAB — HEMOGLOBIN A1C: Hgb A1c MFr Bld: 5.6 % (ref 4.6–6.5)

## 2018-09-14 ENCOUNTER — Other Ambulatory Visit: Payer: Self-pay | Admitting: Internal Medicine

## 2018-11-24 NOTE — Progress Notes (Addendum)
Virtual Visit via Video Note  I connected with patient on 11/25/18 at 11:00 AM EDT by a video enabled telemedicine application and verified that I am speaking with the correct person using two identifiers.   THIS ENCOUNTER IS A VIRTUAL VISIT DUE TO COVID-19 - PATIENT WAS NOT SEEN IN THE OFFICE. PATIENT HAS CONSENTED TO VIRTUAL VISIT / TELEMEDICINE VISIT   Location of patient: home  Location of provider: office  I discussed the limitations of evaluation and management by telemedicine and the availability of in person appointments. The patient expressed understanding and agreed to proceed.   Subjective:   Benjamin Adkins is a 78 y.o. male who presents for Medicare Annual/Subsequent preventive examination.  Review of Systems: No ROS.  Medicare Wellness Virtual Visit.  Visual/audio telehealth visit, UTA vital signs.   See social history for additional risk factors.   Sleep patterns: wears CPAP. Home Safety/Smoke Alarms: Feels safe in home. Smoke alarms in place.  Lives alone in 2 story home.   Male:   CCS- last 04/17/16. Recall 5 yrs.     PSA-  Lab Results  Component Value Date   PSA 4.07 04/28/2018   PSA 2.57 10/27/2017   PSA 5.44 07/23/2017       Objective:    Vitals: Unable to assess. This visit is enabled though telemedicine due to Covid 19.   Advanced Directives 11/25/2018 11/19/2017 11/18/2016 02/27/2016 08/19/2011  Does Patient Have a Medical Advance Directive? Yes Yes Yes Yes Patient has advance directive, copy not in chart  Type of Advance Directive [place];Living will Living will;Healthcare Power of [place];Living will [place] will  Does patient want to make changes to medical advance directive? No - Patient declined - - - -  Copy of [place] in Chart? Yes - validated most recent copy scanned in chart (See row information) No - copy requested No - copy requested No - copy  requested -    Tobacco Social History   Tobacco Use  Smoking Status Former Smoker  . Packs/day: 0.25  . Years: 3.00  . Pack years: 0.75  . Types: Cigarettes  . Start date: 04/12/1959  . Last attempt to quit: 08/12/1961  . Years since quitting: 57.3  Smokeless Tobacco Never Used  Tobacco Comment   during college     Counseling given: Not Answered Comment: during college   Clinical Intake:     Pain : No/denies pain                 Past Medical History:  Diagnosis Date  . Coronary atherosclerosis of unspecified type of vessel, native or graft    MI 2006, h/o CABG  . DJD (degenerative joint disease)   . Elevated prostate specific antigen (PSA)   . Elevated PSA   . Esophageal reflux   . HTN (hypertension)   . Hyperlipidemia    nmr lipoprofile 2004: LDL 154 (1653/711), HDL 42, TG 80.NMR  LDL =,125, but PMH  of MI LDL goal=,70  . Hypertension   . Myocardial infarction ([place])    2006  . Other cataract   . Personal history of colonic polyps   . Shortness of breath    DOE  . Sleep apnea    uses CPAP   Past Surgical History:  Procedure Laterality Date  . CARDIAC CATHETERIZATION     2006  . CATARACT EXTRACTION Bilateral   . COLONOSCOPY W/ POLYPECTOMY    .  CORONARY ARTERY BYPASS GRAFT  2006   X 4   . HERNIA REPAIR     VENTRAL, LEFT & RIGHT GROIN  . INGUINAL HERNIA REPAIR    . TOTAL KNEE ARTHROPLASTY  2005  . TOTAL KNEE ARTHROPLASTY  2006  . TOTAL KNEE ARTHROPLASTY  08/25/2011   Procedure: TOTAL KNEE ARTHROPLASTY;  Surgeon: Lorn Junes, MD;  Location: [facility];  Service: Orthopedics;  Laterality: Right;  DR Noemi Chapel WANTS 64 MINUTES FOR SURGERY   Family History  Problem Relation Age of Onset  . Cancer Father        stomach  . Stomach cancer Father   . Hypertension Mother   . Breast cancer Mother   . Coronary artery disease Mother   . Heart disease Mother   . Breast cancer Sister   . Crohn's disease Sister   . Arthritis Sister   . Arthritis Brother    . Heart attack Paternal Grandfather 69  . Colon cancer Neg Hx   . Prostate cancer Neg Hx    Social History   Socioeconomic History  . Marital status: Widowed    Spouse name: Not on file  . Number of children: 1  . Years of education: Not on file  . Highest education level: Not on file  Occupational History  . Occupation: retired  Scientific laboratory technician  . Financial resource strain: Not on file  . Food insecurity:    Worry: Not on file    Inability: Not on file  . Transportation needs:    Medical: Not on file    Non-medical: Not on file  Tobacco Use  . Smoking status: Former Smoker    Packs/day: 0.25    Years: 3.00    Pack years: 0.75    Types: Cigarettes    Start date: 04/12/1959    Last attempt to quit: 08/12/1961    Years since quitting: 57.3  . Smokeless tobacco: Never Used  . Tobacco comment: during college  Substance and Sexual Activity  . Alcohol use: Yes    Alcohol/week: 7.0 standard drinks    Types: 4 Glasses of wine, 3 Cans of beer per week    Comment: Social  . Drug use: No  . Sexual activity: Yes  Lifestyle  . Physical activity:    Days per week: Not on file    Minutes per session: Not on file  . Stress: Not on file  Relationships  . Social connections:    Talks on phone: Not on file    Gets together: Not on file    Attends religious service: Not on file    Active member of club or organization: Not on file    Attends meetings of clubs or organizations: Not on file    Relationship status: Not on file  Other Topics Concern  . Not on file  Social History Narrative   Lives alone. Has a daughter in [place] , [place] G-son   Daughter: Tamera Reason [phone]    Outpatient Encounter Medications as of 11/25/2018  Medication Sig  . amoxicillin (AMOXIL) 500 MG capsule TAKE 4 CAPSULES BY MOUTH AS NEEDED FOR DENTAL PROCEDURE  . aspirin 325 MG EC tablet Take 325 mg by mouth daily.  Marland Kitchen atorvastatin (LIPITOR) 40 MG tablet Take 1 tablet (40 mg total) by mouth daily.  .  celecoxib (CELEBREX) 100 MG capsule Take 1 capsule (100 mg total) by mouth 2 (two) times daily as needed for moderate pain.  . metoprolol succinate (TOPROL-XL) 25 MG  24 hr tablet TAKE 1 TABLET BY MOUTH EVERY DAY  . Multiple Vitamin (MULTIVITAMIN) capsule Take 1 capsule by mouth daily.    Marland Kitchen omeprazole (PRILOSEC) 20 MG capsule Take 1 capsule (20 mg total) by mouth daily before breakfast.  . nitroGLYCERIN (NITROSTAT) 0.4 MG SL tablet Place 1 tablet (0.4 mg total) under the tongue every 5 (five) minutes as needed. (Patient not taking: Reported on 11/25/2018)   No facility-administered encounter medications on file as of 11/25/2018.     Activities of Daily Living In your present state of health, do you have any difficulty performing the following activities: 11/25/2018  Hearing? N  Vision? N  Difficulty concentrating or making decisions? N  Walking or climbing stairs? N  Dressing or bathing? N  Doing errands, shopping? N  Preparing Food and eating ? N  Using the Toilet? N  In the past six months, have you accidently leaked urine? N  Do you have problems with loss of bowel control? N  Managing your Medications? N  Managing your Finances? N  Housekeeping or managing your Housekeeping? N  Some recent data might be hidden    Patient Care Team: Colon Branch, MD as PCP - General (Internal Medicine) Josue Hector, MD as PCP - Cardiology (Cardiology) Volanda Napoleon, MD as Consulting Physician (Oncology) Ripley Fraise, MD as Referring Physician (Dermatology) Nickie Retort, MD as Consulting Physician (Urology)   Assessment:   This is a routine wellness examination for [place]. Physical assessment deferred to PCP.  Exercise Activities and Dietary recommendations   Diet (meal preparation, eat out, water intake, caffeinated beverages, dairy products, fruits and vegetables): well balanced, on average, 3 meals per day     Goals    . Weight (lb) < 195 lb (88.5 kg)       Fall Risk Fall  Risk  11/25/2018 01/14/2018 11/19/2017 11/18/2016 05/06/2016  Falls in the past year? 0 No No No No     Depression Screen PHQ 2/9 Scores 11/25/2018 01/14/2018 11/18/2016 05/06/2016  PHQ - 2 Score 0 0 0 0    Cognitive Function Ad8 score reviewed for issues:  Issues making decisions:no  Less interest in hobbies / activities:no  Repeats questions, stories (family complaining):no  Trouble using ordinary gadgets (microwave, computer, phone):no  Forgets the month or year: no  Mismanaging finances: no  Remembering appts:no  Daily problems with thinking and/or memory:no Ad8 score is=0         Immunization History  Administered Date(s) Administered  . Influenza Split 05/27/2011, 04/02/2012  . Influenza Whole 05/04/2007, 05/18/2008, 05/22/2009, 05/16/2010  . Influenza, High Dose Seasonal PF 04/28/2013, 04/23/2015, 04/10/2016  . Influenza,inj,Quad PF,6+ Mos 08/10/2014  . Influenza-Unspecified 04/28/2017, 04/29/2018  . Pneumococcal Conjugate-13 04/23/2015  . Pneumococcal Polysaccharide-23 08/09/2013  . Tetanus 08/23/2013  . Zoster 07/02/2011    Screening Tests Health Maintenance  Topic Date Due  . INFLUENZA VACCINE  02/19/2019  . TETANUS/TDAP  08/24/2023  . PNA vac Low Risk Adult  Completed      Plan:   See you next year.  Continue to eat heart healthy diet (full of fruits, vegetables, whole grains, lean protein, water--limit salt, fat, and sugar intake) and increase physical activity as tolerated.  Continue doing brain stimulating activities (puzzles, reading, adult coloring books, staying active) to keep memory sharp.   Bring a copy of your living will and/or healthcare power of attorney to your next office visit.    I have personally reviewed and noted the following  in the patient's chart:   . Medical and social history . Use of alcohol, tobacco or illicit drugs  . Current medications and supplements . Functional ability and status . Nutritional status . Physical  activity . Advanced directives . List of other physicians . Hospitalizations, surgeries, and ER visits in previous 12 months . Vitals . Screenings to include cognitive, depression, and falls . Referrals and appointments  In addition, I have reviewed and discussed with patient certain preventive protocols, quality metrics, and best practice recommendations. A written personalized care plan for preventive services as well as general preventive health recommendations were provided to patient.     Naaman Plummer [place], [place]  11/25/2018  Kathlene November, MD

## 2018-11-25 ENCOUNTER — Ambulatory Visit (INDEPENDENT_AMBULATORY_CARE_PROVIDER_SITE_OTHER): Payer: Medicare Other | Admitting: *Deleted

## 2018-11-25 ENCOUNTER — Encounter: Payer: Self-pay | Admitting: *Deleted

## 2018-11-25 ENCOUNTER — Other Ambulatory Visit: Payer: Self-pay

## 2018-11-25 DIAGNOSIS — Z0000 Encounter for general adult medical examination without abnormal findings: Secondary | ICD-10-CM

## 2018-11-25 NOTE — Patient Instructions (Signed)
See you next year.  Continue to eat heart healthy diet (full of fruits, vegetables, whole grains, lean protein, water--limit salt, fat, and sugar intake) and increase physical activity as tolerated.  Continue doing brain stimulating activities (puzzles, reading, adult coloring books, staying active) to keep memory sharp.   Bring a copy of your living will and/or healthcare power of attorney to your next office visit.   Benjamin Adkins , Thank you for taking time to come for your Medicare Wellness Visit. I appreciate your ongoing commitment to your health goals. Please review the following plan we discussed and let me know if I can assist you in the future.   These are the goals we discussed: Goals    . Weight (lb) < 195 lb (88.5 kg)       This is a list of the screening recommended for you and due dates:  Health Maintenance  Topic Date Due  . Flu Shot  02/19/2019  . Tetanus Vaccine  08/24/2023  . Pneumonia vaccines  Completed    Health Maintenance After Age 33 After age 27, you are at a higher risk for certain long-term diseases and infections as well as injuries from falls. Falls are a major cause of broken bones and head injuries in people who are older than age 24. Getting regular preventive care can help to keep you healthy and well. Preventive care includes getting regular testing and making lifestyle changes as recommended by your health care provider. Talk with your health care provider about:  Which screenings and tests you should have. A screening is a test that checks for a disease when you have no symptoms.  A diet and exercise plan that is right for you. What should I know about screenings and tests to prevent falls? Screening and testing are the best ways to find a health problem early. Early diagnosis and treatment give you the best chance of managing medical conditions that are common after age 9. Certain conditions and lifestyle choices may make you more likely to have a  fall. Your health care provider may recommend:  Regular vision checks. Poor vision and conditions such as cataracts can make you more likely to have a fall. If you wear glasses, make sure to get your prescription updated if your vision changes.  Medicine review. Work with your health care provider to regularly review all of the medicines you are taking, including over-the-counter medicines. Ask your health care provider about any side effects that may make you more likely to have a fall. Tell your health care provider if any medicines that you take make you feel dizzy or sleepy.  Osteoporosis screening. Osteoporosis is a condition that causes the bones to get weaker. This can make the bones weak and cause them to break more easily.  Blood pressure screening. Blood pressure changes and medicines to control blood pressure can make you feel dizzy.  Strength and balance checks. Your health care provider may recommend certain tests to check your strength and balance while standing, walking, or changing positions.  Foot health exam. Foot pain and numbness, as well as not wearing proper footwear, can make you more likely to have a fall.  Depression screening. You may be more likely to have a fall if you have a fear of falling, feel emotionally low, or feel unable to do activities that you used to do.  Alcohol use screening. Using too much alcohol can affect your balance and may make you more likely to have a fall.  What actions can I take to lower my risk of falls? General instructions  Talk with your health care provider about your risks for falling. Tell your health care provider if: ? You fall. Be sure to tell your health care provider about all falls, even ones that seem minor. ? You feel dizzy, sleepy, or off-balance.  Take over-the-counter and prescription medicines only as told by your health care provider. These include any supplements.  Eat a healthy diet and maintain a healthy weight. A  healthy diet includes low-fat dairy products, low-fat (lean) meats, and fiber from whole grains, beans, and lots of fruits and vegetables. Home safety  Remove any tripping hazards, such as rugs, cords, and clutter.  Install safety equipment such as grab bars in bathrooms and safety rails on stairs.  Keep rooms and walkways well-lit. Activity   Follow a regular exercise program to stay fit. This will help you maintain your balance. Ask your health care provider what types of exercise are appropriate for you.  If you need a cane or walker, use it as recommended by your health care provider.  Wear supportive shoes that have nonskid soles. Lifestyle  Do not drink alcohol if your health care provider tells you not to drink.  If you drink alcohol, limit how much you have: ? 0-1 drink a day for women. ? 0-2 drinks a day for men.  Be aware of how much alcohol is in your drink. In the [place] one drink equals one typical bottle of beer (12 oz), one-half glass of wine (5 oz), or one shot of hard liquor (1 oz).  Do not use any products that contain nicotine or tobacco, such as cigarettes and e-cigarettes. If you need help quitting, ask your health care provider. Summary  Having a healthy lifestyle and getting preventive care can help to protect your health and wellness after age 69.  Screening and testing are the best way to find a health problem early and help you avoid having a fall. Early diagnosis and treatment give you the best chance for managing medical conditions that are more common for people who are older than age 62.  Falls are a major cause of broken bones and head injuries in people who are older than age 11. Take precautions to prevent a fall at home.  Work with your health care provider to learn what changes you can make to improve your health and wellness and to prevent falls. This information is not intended to replace advice given to you by your health care provider. Make  sure you discuss any questions you have with your health care provider. Document Released: 05/20/2017 Document Revised: 05/20/2017 Document Reviewed: 05/20/2017 Elsevier Interactive Patient Education  2019 [employer].

## 2018-11-26 MED ORDER — NITROGLYCERIN 0.4 MG SL SUBL: 0.4000 mg | SUBLINGUAL_TABLET | SUBLINGUAL | 3 refills | Status: AC | PRN

## 2018-11-26 NOTE — Telephone Encounter (Signed)
Pt's medication was sent to pt's pharmacy as requested. Confirmation received.  °

## 2019-01-09 ENCOUNTER — Other Ambulatory Visit: Payer: Self-pay | Admitting: Internal Medicine

## 2019-01-31 ENCOUNTER — Other Ambulatory Visit: Payer: Self-pay | Admitting: Internal Medicine

## 2019-03-15 ENCOUNTER — Telehealth: Payer: Self-pay

## 2019-03-15 NOTE — Telephone Encounter (Signed)
Copied from [place] [id number]. Topic: General - Other >> Mar 15, 2019  4:41 PM Pauline Good wrote: Reason for CRM: pt need to know if he need a booster for pneumonia and shingles. Please call pt

## 2019-03-15 NOTE — Telephone Encounter (Signed)
Please advise 

## 2019-03-16 NOTE — Telephone Encounter (Signed)
He does not need  pneumonia shot boosters. He does need a high-dose flu shot this fall Shingrix x2 is also recommended at his convenience (he had a Zostavax)

## 2019-03-16 NOTE — Telephone Encounter (Signed)
Spoke w/ Pt- informed not due for pneumonia vaccinations- informed he can have Shingrix- but would need to be completed at pharmacy because Medicare doesn't cover them here- he is scheduled for flu vaccine tomorrow.

## 2019-03-17 ENCOUNTER — Other Ambulatory Visit: Payer: Self-pay

## 2019-03-17 ENCOUNTER — Ambulatory Visit (INDEPENDENT_AMBULATORY_CARE_PROVIDER_SITE_OTHER): Payer: Medicare Other

## 2019-03-17 DIAGNOSIS — Z23 Encounter for immunization: Secondary | ICD-10-CM

## 2019-04-05 ENCOUNTER — Ambulatory Visit (INDEPENDENT_AMBULATORY_CARE_PROVIDER_SITE_OTHER): Payer: Medicare Other | Admitting: Internal Medicine

## 2019-04-05 ENCOUNTER — Encounter: Payer: Self-pay | Admitting: Internal Medicine

## 2019-04-05 ENCOUNTER — Other Ambulatory Visit: Payer: Self-pay

## 2019-04-05 VITALS — BP 138/74 | HR 56 | Temp 97.1°F | Resp 16 | Ht 69.0 in | Wt 202.5 lb

## 2019-04-05 DIAGNOSIS — M159 Polyosteoarthritis, unspecified: Secondary | ICD-10-CM

## 2019-04-05 DIAGNOSIS — M150 Primary generalized (osteo)arthritis: Secondary | ICD-10-CM | POA: Diagnosis not present

## 2019-04-05 DIAGNOSIS — I10 Essential (primary) hypertension: Secondary | ICD-10-CM

## 2019-04-05 DIAGNOSIS — E785 Hyperlipidemia, unspecified: Secondary | ICD-10-CM

## 2019-04-05 DIAGNOSIS — R739 Hyperglycemia, unspecified: Secondary | ICD-10-CM

## 2019-04-05 NOTE — Progress Notes (Signed)
Pre visit review using our clinic review tool, if applicable. No additional management support is needed unless otherwise documented below in the visit note. 

## 2019-04-05 NOTE — Progress Notes (Signed)
Subjective:    Patient ID: Benjamin Adkins, male    DOB: 1940/10/27, 78 y.o.   MRN: [id number]  DOS:  04/05/2019 Type of visit - description: Routine office visit In general doing well, good med compliance. Has not been exercising as much lately because the gym is closed however he continue golfing, doing the stationary bike.    Review of Systems Denies chest pain or difficulty breathing No nausea, vomiting, diarrhea.  Past Medical History:  Diagnosis Date  . Coronary atherosclerosis of unspecified type of vessel, native or graft    MI 2006, h/o CABG  . DJD (degenerative joint disease)   . Elevated prostate specific antigen (PSA)   . Elevated PSA   . Esophageal reflux   . HTN (hypertension)   . Hyperlipidemia    nmr lipoprofile 2004: LDL 154 (1653/711), HDL 42, TG 80.NMR  LDL =,125, but PMH  of MI LDL goal=,70  . Hypertension   . Myocardial infarction ([place])    2006  . Other cataract   . Personal history of colonic polyps   . Shortness of breath    DOE  . Sleep apnea    uses CPAP    Past Surgical History:  Procedure Laterality Date  . CARDIAC CATHETERIZATION     2006  . CATARACT EXTRACTION Bilateral   . COLONOSCOPY W/ POLYPECTOMY    . CORONARY ARTERY BYPASS GRAFT  2006   X 4   . HERNIA REPAIR     VENTRAL, LEFT & RIGHT GROIN  . INGUINAL HERNIA REPAIR    . TOTAL KNEE ARTHROPLASTY  2005  . TOTAL KNEE ARTHROPLASTY  2006  . TOTAL KNEE ARTHROPLASTY  08/25/2011   Procedure: TOTAL KNEE ARTHROPLASTY;  Surgeon: Lorn Junes, MD;  Location: [place];  Service: Orthopedics;  Laterality: Right;  DR Bliss [place]    Social History   Socioeconomic History  . Marital status: Widowed    Spouse name: Not on file  . Number of children: 1  . Years of education: Not on file  . Highest education level: Not on file  Occupational History  . Occupation: retired  Scientific laboratory technician  . Financial resource strain: Not on file  . Food insecurity    Worry: Not on file     Inability: Not on file  . Transportation needs    Medical: Not on file    Non-medical: Not on file  Tobacco Use  . Smoking status: Former Smoker    Packs/day: 0.25    Years: 3.00    Pack years: 0.75    Types: Cigarettes    Start date: 04/12/1959    Quit date: 08/12/1961    Years since quitting: 57.6  . Smokeless tobacco: Never Used  . Tobacco comment: during college  Substance and Sexual Activity  . Alcohol use: Yes    Alcohol/week: 7.0 standard drinks    Types: 4 Glasses of wine, 3 Cans of beer per week    Comment: Social  . Drug use: No  . Sexual activity: Yes  Lifestyle  . Physical activity    Days per week: Not on file    Minutes per session: Not on file  . Stress: Not on file  Relationships  . Social Herbalist on phone: Not on file    Gets together: Not on file    Attends religious service: Not on file    Active member of club or organization: Not on file  Attends meetings of clubs or organizations: Not on file    Relationship status: Not on file  . Intimate partner violence    Fear of current or ex partner: Not on file    Emotionally abused: Not on file    Physically abused: Not on file    Forced sexual activity: Not on file  Other Topics Concern  . Not on file  Social History Narrative   Lives alone. Has a daughter in [place] , [place] G-son   Daughter: Tamera Reason [id number]      Allergies as of 04/05/2019   No Known Allergies     Medication List       Accurate as of April 05, 2019 10:14 AM. If you have any questions, ask your nurse or doctor.        amoxicillin 500 MG capsule Commonly known as: AMOXIL TAKE 4 CAPSULES BY MOUTH AS NEEDED FOR DENTAL PROCEDURE   aspirin 325 MG EC tablet Take 325 mg by mouth daily.   atorvastatin 40 MG tablet Commonly known as: LIPITOR Take 1 tablet (40 mg total) by mouth daily.   celecoxib 100 MG capsule Commonly known as: CELEBREX Take 1 capsule (100 mg total) by mouth 2 (two) times daily as  needed for moderate pain.   metoprolol succinate 25 MG 24 hr tablet Commonly known as: TOPROL-XL TAKE 1 TABLET BY MOUTH EVERY DAY   multivitamin capsule Take 1 capsule by mouth daily.   nitroGLYCERIN 0.4 MG SL tablet Commonly known as: NITROSTAT Place 1 tablet (0.4 mg total) under the tongue every 5 (five) minutes as needed.   omeprazole 20 MG capsule Commonly known as: PRILOSEC Take 1 capsule (20 mg total) by mouth daily before breakfast.           Objective:   Physical Exam BP 138/74 (BP Location: Left Arm, Patient Position: Sitting, Cuff Size: Normal)   Pulse (!) 56   Temp (!) 97.1 F (36.2 C) (Temporal)   Resp 16   Ht 5\' 9"  (1.753 m)   Wt 202 lb 8 oz (91.9 kg)   SpO2 98%   BMI 29.90 kg/m   General:   Well developed, NAD, BMI noted. HEENT:  Normocephalic . Face symmetric, atraumatic Lungs:  CTA B Normal respiratory effort, no intercostal retractions, no accessory muscle use. Heart: RRR,  no murmur.  No pretibial edema bilaterally  Skin: Not pale. Not jaundice. Varicose veins noted mostly at the right calf.  No complications such as redness or ulcers. Neurologic:  alert & oriented X3.  Speech normal, gait appropriate for age and unassisted Psych--  Cognition and judgment appear intact.  Cooperative with normal attention span and concentration.  Behavior appropriate. No anxious or depressed appearing.      Assessment     Assessment  Prediabetes HTN Hyperlipidemia CAD, MI 2006, CABG, RBBB OSA , on CPAP DJD Knee replacement-- on abx pre-dental procedures  Elevated PSA  PLAN: Prediabetes: Last A1c excellent HTN: Well-controlled, last BMP satisfactory.  No change Hyperlipidemia: LDL 92, goal is 70.  We talk about rechecking versus wait till next OV,  elected to wait, he has not been able to go to the gym is unlikely that the LDL has decreased.  For now continue Lipitor.  Recheck on RTC DJD: Takes Celebrex sporadically not daily. Preventive care:  Had a flu shot, still waiting for Shingrix, not available at his pharmacy just yet. RTC 07-2019 CPX

## 2019-04-05 NOTE — Patient Instructions (Signed)
   GO TO THE FRONT DESK Schedule your next appointment FOR A PHYSICAL 07/2019

## 2019-04-06 NOTE — Assessment & Plan Note (Signed)
Prediabetes: Last A1c excellent HTN: Well-controlled, last BMP satisfactory.  No change Hyperlipidemia: LDL 92, goal is 70.  We talk about rechecking versus wait till next OV,  elected to wait, he has not been able to go to the gym is unlikely that the LDL has decreased.  For now continue Lipitor.  Recheck on RTC DJD: Takes Celebrex sporadically not daily. Preventive care: Had a flu shot, still waiting for Shingrix, not available at his pharmacy just yet. RTC 07-2019 CPX

## 2019-05-28 ENCOUNTER — Other Ambulatory Visit: Payer: Self-pay | Admitting: Internal Medicine

## 2019-07-08 ENCOUNTER — Other Ambulatory Visit: Payer: Self-pay | Admitting: Internal Medicine

## 2019-07-08 NOTE — Telephone Encounter (Signed)
Last OV 04/05/19 Last refill 01/10/19 #90/1 Next OV 08/10/18

## 2019-07-12 ENCOUNTER — Other Ambulatory Visit: Payer: Self-pay | Admitting: Cardiovascular Disease

## 2019-08-03 ENCOUNTER — Other Ambulatory Visit: Payer: Self-pay | Admitting: Cardiovascular Disease

## 2019-08-10 ENCOUNTER — Other Ambulatory Visit: Payer: Self-pay

## 2019-08-11 ENCOUNTER — Ambulatory Visit (INDEPENDENT_AMBULATORY_CARE_PROVIDER_SITE_OTHER): Payer: Medicare PPO | Admitting: Internal Medicine

## 2019-08-11 ENCOUNTER — Other Ambulatory Visit: Payer: Self-pay

## 2019-08-11 ENCOUNTER — Encounter: Payer: Self-pay | Admitting: Internal Medicine

## 2019-08-11 VITALS — BP 137/81 | HR 63 | Temp 96.5°F | Resp 16 | Ht 69.0 in | Wt 202.0 lb

## 2019-08-11 DIAGNOSIS — E785 Hyperlipidemia, unspecified: Secondary | ICD-10-CM

## 2019-08-11 DIAGNOSIS — Z0000 Encounter for general adult medical examination without abnormal findings: Secondary | ICD-10-CM

## 2019-08-11 DIAGNOSIS — R739 Hyperglycemia, unspecified: Secondary | ICD-10-CM | POA: Diagnosis not present

## 2019-08-11 LAB — COMPREHENSIVE METABOLIC PANEL
ALT: 20 U/L (ref 0–53)
AST: 21 U/L (ref 0–37)
Albumin: 4.3 g/dL (ref 3.5–5.2)
Alkaline Phosphatase: 75 U/L (ref 39–117)
BUN: 17 mg/dL (ref 6–23)
CO2: 27 mEq/L (ref 19–32)
Calcium: 9.5 mg/dL (ref 8.4–10.5)
Chloride: 103 mEq/L (ref 96–112)
Creatinine, Ser: 1 mg/dL (ref 0.40–1.50)
GFR: 72.22 mL/min (ref >60.00)
Glucose, Bld: 104 mg/dL — ABNORMAL HIGH (ref 70–99)
Potassium: 4.4 mEq/L (ref 3.5–5.1)
Sodium: 136 mEq/L (ref 135–145)
Total Bilirubin: 0.9 mg/dL (ref 0.2–1.2)
Total Protein: 6.8 g/dL (ref 6.0–8.3)

## 2019-08-11 LAB — LIPID PANEL
Cholesterol: 156 mg/dL (ref 0–200)
HDL: 51 mg/dL (ref >39.00)
LDL Cholesterol: 91 mg/dL (ref 0–99)
NonHDL: 105.3
Total CHOL/HDL Ratio: 3
Triglycerides: 74 mg/dL (ref 0.0–149.0)
VLDL: 14.8 mg/dL (ref 0.0–40.0)

## 2019-08-11 LAB — CBC WITH DIFFERENTIAL/PLATELET
Basophils Absolute: 0 10*3/uL (ref 0.0–0.1)
Basophils Relative: 0.3 % (ref 0.0–3.0)
Eosinophils Absolute: 0.2 10*3/uL (ref 0.0–0.7)
Eosinophils Relative: 3 % (ref 0.0–5.0)
HCT: 45.2 % (ref 39.0–52.0)
Hemoglobin: 15.7 g/dL (ref 13.0–17.0)
Lymphocytes Relative: 26.5 % (ref 12.0–46.0)
Lymphs Abs: 1.7 10*3/uL (ref 0.7–4.0)
MCHC: 34.7 g/dL (ref 30.0–36.0)
MCV: 92.2 fl (ref 78.0–100.0)
Monocytes Absolute: 0.5 10*3/uL (ref 0.1–1.0)
Monocytes Relative: 8.4 % (ref 3.0–12.0)
Neutro Abs: 3.9 10*3/uL (ref 1.4–7.7)
Neutrophils Relative %: 61.8 % (ref 43.0–77.0)
Platelets: 147 10*3/uL — ABNORMAL LOW (ref 150.0–400.0)
RBC: 4.91 Mil/uL (ref 4.22–5.81)
RDW: 13.2 % (ref 11.5–15.5)
WBC: 6.3 10*3/uL (ref 4.0–10.5)

## 2019-08-11 LAB — HEMOGLOBIN A1C: Hgb A1c MFr Bld: 5.5 % (ref 4.6–6.5)

## 2019-08-11 NOTE — Patient Instructions (Signed)
GO TO THE LAB : Get the blood work     GO TO THE FRONT DESK Schedule your next appointment   for a physical exam in 1 year   Check your blood pressure from time to time BP GOAL is between 110/65 and  135/85. If it is consistently higher or lower, let me know

## 2019-08-11 NOTE — Progress Notes (Signed)
Subjective:    Patient ID: Benjamin Adkins, male    DOB: [birth year], 79 y.o.   MRN: [id number]  DOS:  08/11/2019 Type of visit - description: CPX  In general feeling well. He remains active playing golf. Has no concerns.  Review of Systems  A 14 point review of systems is negative    Past Medical History:  Diagnosis Date  . Coronary atherosclerosis of unspecified type of vessel, native or graft    MI 2006, h/o CABG  . DJD (degenerative joint disease)   . Elevated prostate specific antigen (PSA)   . Elevated PSA   . Esophageal reflux   . HTN (hypertension)   . Hyperlipidemia    nmr lipoprofile 2004: LDL 154 (1653/711), HDL 42, TG 80.NMR  LDL =,125, but PMH  of MI LDL goal=,70  . Hypertension   . Myocardial infarction ([place])    2006  . Other cataract   . Personal history of colonic polyps   . Shortness of breath    DOE  . Sleep apnea    uses CPAP    Past Surgical History:  Procedure Laterality Date  . CARDIAC CATHETERIZATION     2006  . CATARACT EXTRACTION Bilateral   . COLONOSCOPY W/ POLYPECTOMY    . CORONARY ARTERY BYPASS GRAFT  2006   X 4   . HERNIA REPAIR     VENTRAL, LEFT & RIGHT GROIN  . INGUINAL HERNIA REPAIR    . TOTAL KNEE ARTHROPLASTY  2005  . TOTAL KNEE ARTHROPLASTY  2006  . TOTAL KNEE ARTHROPLASTY  08/25/2011   Procedure: TOTAL KNEE ARTHROPLASTY;  Surgeon: Lorn Junes, MD;  Location: [place];  Service: Orthopedics;  Laterality: Right;  DR Noemi Chapel WANTS 54 MINUTES FOR SURGERY   Family History  Problem Relation Age of Onset  . Cancer Father        stomach  . Stomach cancer Father   . Hypertension Mother   . Breast cancer Mother   . Coronary artery disease Mother   . Heart disease Mother   . Breast cancer Sister   . Crohn's disease Sister   . Arthritis Sister   . Arthritis Brother   . Heart attack Paternal Grandfather 39  . Colon cancer Neg Hx   . Prostate cancer Neg Hx         Objective:   Physical Exam BP 137/81 (BP Location: Left  Arm, Patient Position: Sitting, Cuff Size: Small)   Pulse 63   Temp (!) 96.5 F (35.8 C) (Temporal)   Resp 16   Ht 5\' 9"  (1.753 m)   Wt 202 lb (91.6 kg)   SpO2 97%   BMI 29.83 kg/m  General: Well developed, NAD, BMI noted Neck: No  thyromegaly  HEENT:  Normocephalic . Face symmetric, atraumatic Lungs:  CTA B Normal respiratory effort, no intercostal retractions, no accessory muscle use. Heart: RRR,  no murmur.  No pretibial edema bilaterally  Abdomen:  Not distended, soft, non-tender. No rebound or rigidity.   Skin: Exposed areas without rash. Not pale. Not jaundice Neurologic:  alert & oriented X3.  Speech normal, gait appropriate for age and unassisted Strength symmetric and appropriate for age.  Psych: Cognition and judgment appear intact.  Cooperative with normal attention span and concentration.  Behavior appropriate. No anxious or depressed appearing.     Assessment      Assessment  Prediabetes HTN Hyperlipidemia CAD, MI 2006, CABG, RBBB OSA , on CPAP DJD Knee replacement-- on abx  pre-dental procedures and: BP today is very good, continue Elevated PSA  PLAN: Here for CPX Prediabetes: He remains active playing golf but is not going to the gym.  Check A1c HTN: BP today satisfactory, continue metoprolol. Hyperlipidemia, on Lipitor, checking labs CAD: Asymptomatic, to see cardiology soon OSA: Good compliance with CPAP DJD: Taking Celebrex as needed. RTC 1 year  This visit occurred during the SARS-CoV-2 public health emergency.  Safety protocols were in place, including screening questions prior to the visit, additional usage of staff PPE, and extensive cleaning of exam room while observing appropriate contact time as indicated for disinfecting solutions.

## 2019-08-11 NOTE — Progress Notes (Signed)
Pre visit review using our clinic review tool, if applicable. No additional management support is needed unless otherwise documented below in the visit note. 

## 2019-08-12 NOTE — Assessment & Plan Note (Signed)
Here for CPX Prediabetes: He remains active playing golf but is not going to the gym.  Check A1c HTN: BP today satisfactory, continue metoprolol. Hyperlipidemia, on Lipitor, checking labs CAD: Asymptomatic, to see cardiology soon OSA: Good compliance with CPAP DJD: Taking Celebrex as needed. RTC 1 year

## 2019-08-12 NOTE — Assessment & Plan Note (Signed)
-  Tdap: 08/2103 - PNA 23: 07/2013 ; prevnar:04-2015 - zostavax:06/2011 -Shingrix No. 1 provided, second dose pending - moderna covid shot 08-01-19 - Had a Flu shot  -CCS:  cscope 02/2006 Diverticulosis cscope 04-2016- 1 polyp, 5 years   -prostate ca screening: PSA is elevated, sees urology  -labs : CMP, FLP, CBC, A1c -Diet and exercise: He remains active and playing golf, has not been able to go to the gym, trying to eat healthy.

## 2019-08-15 MED ORDER — EZETIMIBE 10 MG PO TABS: 10.0000 mg | ORAL_TABLET | Freq: Every day | ORAL | 3 refills | Status: DC

## 2019-08-15 NOTE — Addendum Note (Signed)
Addended byDamita Dunnings D on: 08/15/2019 07:41 AM   Modules accepted: Orders

## 2019-08-30 ENCOUNTER — Other Ambulatory Visit: Payer: Self-pay | Admitting: Internal Medicine

## 2019-08-30 NOTE — Progress Notes (Signed)
Patient ID: Benjamin Adkins, male   DOB: 01-Oct-1940, 79 y.o.   MRN: [id number]     This is a 79 y.o.  white male patient f/U CAD  In 2005 he had a perioperative MI after left knee replacement. He required urgent stenting to the RCA and subsequent CABG. He did have a negative treadmill prior to his knee surgery in 2005. He has done well since that time. He has a history of a right bundle branch block.his lipids are controlled. He has no history of diabetes and does not smoke.   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  All other systems reviewed and negative  General: BP 122/80   Pulse 65   Ht 5\' 9"  (1.753 m)   Wt 203 lb 6.4 oz (92.3 kg)   SpO2 99%   BMI 30.04 kg/m  Affect appropriate Healthy:  appears stated age 33: normal Neck supple with no adenopathy JVP normal no bruits no thyromegaly Lungs clear with no wheezing and good diaphragmatic motion Heart:  S1/S2 no murmur, no rub, gallop or click PMI normal Abdomen: benighn, BS positve, no tenderness, no AAA no bruit.  No HSM or HJR Distal pulses intact with no bruits No edema Neuro non-focal Skin warm and dry No muscular weakness Post bilateral TKR;s      Current Outpatient Medications  Medication Sig Dispense Refill  . amoxicillin (AMOXIL) 500 MG capsule TAKE 4 CAPSULES BY MOUTH AS NEEDED FOR DENTAL PROCEDURE 20 capsule 0  . aspirin 325 MG EC tablet Take 325 mg by mouth daily.    Marland Kitchen atorvastatin (LIPITOR) 40 MG tablet TAKE 1 TABLET BY MOUTH EVERY DAY 90 tablet 1  . celecoxib (CELEBREX) 100 MG capsule Take 1 capsule (100 mg total) by mouth 2 (two) times daily as needed for moderate pain. 180 capsule 1  . ezetimibe (ZETIA) 10 MG tablet Take 1 tablet (10 mg total) by mouth daily. 90 tablet 3  . metoprolol succinate (TOPROL-XL) 25 MG 24 hr tablet Take 1 tablet by mouth daily**DO NOT CRUSH** please keep upcoming appt in February with Dr. Johnsie Cancel before anymore refills. Thank you 30 tablet 1  . Multiple Vitamin (MULTIVITAMIN) capsule Take 1 capsule by mouth daily.      . nitroGLYCERIN (NITROSTAT) 0.4 MG SL tablet Place 1 tablet (0.4 mg total) under the tongue every 5 (five) minutes as needed. 25 tablet 3  . omeprazole (PRILOSEC) 20 MG capsule Take 1 capsule (20 mg total) by mouth daily before breakfast. 90 capsule 3   No current facility-administered medications for this visit.    Allergies  Patient has no known allergies.  Electrocardiogram:   09/07/19  SR rate 64 RBBB LAFB old  ILMI  Assessment and Plan CAD:  Stable with no angina and good activity level.  Continue medical Rx last myovue 2013 old IMI no ischemia  He is active at health club and golfing He does not want stress test as he had normal one right before his MI No indication for cath at this time   Chol:  On statin LDL 91 08/11/19 on lipitor 40 mg and zetia 10 mg labs with primary   GERD:  On prilosec discussed low carb diet   RBBB: chronic no high grade AV block yearly ECG   F/U with me in a year  [employer]

## 2019-09-01 ENCOUNTER — Other Ambulatory Visit: Payer: Self-pay | Admitting: Cardiovascular Disease

## 2019-09-07 ENCOUNTER — Ambulatory Visit: Payer: Medicare PPO | Admitting: Cardiovascular Disease

## 2019-09-07 ENCOUNTER — Encounter: Payer: Self-pay | Admitting: Cardiovascular Disease

## 2019-09-07 ENCOUNTER — Other Ambulatory Visit: Payer: Self-pay

## 2019-09-07 VITALS — BP 122/80 | HR 65 | Ht 69.0 in | Wt 203.4 lb

## 2019-09-07 DIAGNOSIS — I2510 Atherosclerotic heart disease of native coronary artery without angina pectoris: Secondary | ICD-10-CM

## 2019-09-07 DIAGNOSIS — R972 Elevated prostate specific antigen [PSA]: Secondary | ICD-10-CM | POA: Diagnosis not present

## 2019-09-07 LAB — PSA: PSA: 4.08

## 2019-09-07 MED ORDER — METOPROLOL SUCCINATE ER 25 MG PO TB24: 25.0000 mg | ORAL_TABLET | Freq: Every day | ORAL | 3 refills | Status: DC

## 2019-09-07 MED ORDER — ATORVASTATIN CALCIUM 40 MG PO TABS: 40.0000 mg | ORAL_TABLET | Freq: Every day | ORAL | 3 refills | Status: DC

## 2019-09-07 NOTE — Patient Instructions (Addendum)
Medication Instructions:   *If you need a refill on your cardiac medications before your next appointment, please call your pharmacy*  Lab Work:  If you have labs (blood work) drawn today and your tests are completely normal, you will receive your results only by: . MyChart Message (if you have MyChart) OR . A paper copy in the mail If you have any lab test that is abnormal or we need to change your treatment, we will call you to review the results.  Follow-Up: At [facility], you and your health needs are our priority.  As part of our continuing mission to provide you with exceptional heart care, we have created designated Provider Care Teams.  These Care Teams include your primary Cardiologist (physician) and Advanced Practice Providers (APPs -  Physician Assistants and Nurse Practitioners) who all work together to provide you with the care you need, when you need it.  Your next appointment:   12 month(s)  The format for your next appointment:   In Person  Provider:   You may see Peter Nishan, MD or one of the following Advanced Practice Providers on your designated Care Team:    Lori Gerhardt, NP  Laura Ingold, NP  Jill McDaniel, NP    

## 2019-09-08 ENCOUNTER — Other Ambulatory Visit: Payer: Self-pay

## 2019-09-08 MED ORDER — METOPROLOL SUCCINATE ER 25 MG PO TB24: 25.0000 mg | ORAL_TABLET | Freq: Every day | ORAL | 3 refills | Status: DC

## 2019-09-08 MED ORDER — ATORVASTATIN CALCIUM 40 MG PO TABS: 40.0000 mg | ORAL_TABLET | Freq: Every day | ORAL | 3 refills | Status: DC

## 2019-09-08 NOTE — Progress Notes (Signed)
Prescriptions from yesterdays office visit did not go through. Reordered to patient's pharmacy.

## 2019-09-13 DIAGNOSIS — R972 Elevated prostate specific antigen [PSA]: Secondary | ICD-10-CM | POA: Diagnosis not present

## 2019-09-26 ENCOUNTER — Encounter: Payer: Self-pay | Admitting: Internal Medicine

## 2019-11-01 DIAGNOSIS — H2513 Age-related nuclear cataract, bilateral: Secondary | ICD-10-CM | POA: Diagnosis not present

## 2019-11-29 ENCOUNTER — Encounter: Payer: Self-pay | Admitting: Internal Medicine

## 2019-11-30 ENCOUNTER — Other Ambulatory Visit (INDEPENDENT_AMBULATORY_CARE_PROVIDER_SITE_OTHER): Payer: Medicare PPO

## 2019-11-30 ENCOUNTER — Other Ambulatory Visit: Payer: Self-pay

## 2019-11-30 DIAGNOSIS — E785 Hyperlipidemia, unspecified: Secondary | ICD-10-CM | POA: Diagnosis not present

## 2019-11-30 LAB — LIPID PANEL
Cholesterol: 123 mg/dL (ref 0–200)
HDL: 46.1 mg/dL (ref >39.00)
LDL Cholesterol: 52 mg/dL (ref 0–99)
NonHDL: 77.24
Total CHOL/HDL Ratio: 3
Triglycerides: 124 mg/dL (ref 0.0–149.0)
VLDL: 24.8 mg/dL (ref 0.0–40.0)

## 2020-01-13 DIAGNOSIS — G4733 Obstructive sleep apnea (adult) (pediatric): Secondary | ICD-10-CM | POA: Diagnosis not present

## 2020-02-01 NOTE — Progress Notes (Addendum)
I connected with Derrall today by telephone and verified that I am speaking with the correct person using two identifiers. Location patient: home Location provider: work Persons participating in the virtual visit: patient, Marine scientist.    I discussed the limitations, risks, security and privacy concerns of performing an evaluation and management service by telephone and the availability of in person appointments. I also discussed with the patient that there may be a patient responsible charge related to this service. The patient expressed understanding and verbally consented to this telephonic visit.    Interactive audio and video telecommunications were attempted between this provider and patient, however failed, due to patient having technical difficulties OR patient did not have access to video capability.  We continued and completed visit with audio only.  Some vital signs may be absent or patient reported.   Subjective:   Benjamin Adkins is a 79 y.o. male who presents for Medicare Annual/Subsequent preventive examination.  Review of Systems    Cardiac Risk Factors include: advanced age (>57men, >4 women);male gender     Objective:    Today's Vitals   02/02/20 0934  Weight: 197 lb (89.4 kg)   Body mass index is 29.09 kg/m.  Advanced Directives 02/02/2020 11/25/2018 11/19/2017 11/18/2016 02/27/2016 08/19/2011  Does Patient Have a Medical Advance Directive? Yes Yes Yes Yes Yes Patient has advance directive, copy not in chart  Type of Advance Directive [place];Living will [place];Living will Living will;Healthcare Power of [place];Living will [place] will  Does patient want to make changes to medical advance directive? No - Patient declined No - Patient declined - - - -  Copy of [place] in Chart? No - copy requested Yes - validated most recent copy scanned in chart (See row  information) No - copy requested No - copy requested No - copy requested -    Current Medications (verified) Outpatient Encounter Medications as of 02/02/2020  Medication Sig  . amoxicillin (AMOXIL) 500 MG capsule TAKE 4 CAPSULES BY MOUTH AS NEEDED FOR DENTAL PROCEDURE  . aspirin 325 MG EC tablet Take 325 mg by mouth daily.  Marland Kitchen atorvastatin (LIPITOR) 40 MG tablet Take 1 tablet (40 mg total) by mouth daily.  . celecoxib (CELEBREX) 100 MG capsule Take 1 capsule (100 mg total) by mouth 2 (two) times daily as needed for moderate pain.  Marland Kitchen ezetimibe (ZETIA) 10 MG tablet Take 1 tablet (10 mg total) by mouth daily.  . metoprolol succinate (TOPROL-XL) 25 MG 24 hr tablet Take 1 tablet (25 mg total) by mouth daily.  . Multiple Vitamin (MULTIVITAMIN) capsule Take 1 capsule by mouth daily.    Marland Kitchen omeprazole (PRILOSEC) 20 MG capsule Take 1 capsule (20 mg total) by mouth daily before breakfast.  . nitroGLYCERIN (NITROSTAT) 0.4 MG SL tablet Place 1 tablet (0.4 mg total) under the tongue every 5 (five) minutes as needed. (Patient not taking: Reported on 02/02/2020)   No facility-administered encounter medications on file as of 02/02/2020.    Allergies (verified) Patient has no known allergies.   History: Past Medical History:  Diagnosis Date  . Coronary atherosclerosis of unspecified type of vessel, native or graft    MI 2006, h/o CABG  . DJD (degenerative joint disease)   . Elevated prostate specific antigen (PSA)   . Elevated PSA   . Esophageal reflux   . HTN (hypertension)   . Hyperlipidemia    nmr lipoprofile 2004: LDL  154 (1653/711), HDL 42, TG 80.NMR  LDL =,125, but PMH  of MI LDL goal=,70  . Hypertension   . Myocardial infarction ([place])    2006  . Other cataract   . Personal history of colonic polyps   . Shortness of breath    DOE  . Sleep apnea    uses CPAP   Past Surgical History:  Procedure Laterality Date  . CARDIAC CATHETERIZATION     2006  . CATARACT EXTRACTION Bilateral   .  COLONOSCOPY W/ POLYPECTOMY    . CORONARY ARTERY BYPASS GRAFT  2006   X 4   . HERNIA REPAIR     VENTRAL, LEFT & RIGHT GROIN  . INGUINAL HERNIA REPAIR    . TOTAL KNEE ARTHROPLASTY  2005  . TOTAL KNEE ARTHROPLASTY  2006  . TOTAL KNEE ARTHROPLASTY  08/25/2011   Procedure: TOTAL KNEE ARTHROPLASTY;  Surgeon: Lorn Junes, MD;  Location: [facility];  Service: Orthopedics;  Laterality: Right;  DR Noemi Chapel WANTS 80 MINUTES FOR SURGERY   Family History  Problem Relation Age of Onset  . Cancer Father        stomach  . Stomach cancer Father   . Hypertension Mother   . Breast cancer Mother   . Coronary artery disease Mother   . Heart disease Mother   . Breast cancer Sister   . Crohn's disease Sister   . Arthritis Sister   . Arthritis Brother   . Heart attack Paternal Grandfather 73  . Colon cancer Neg Hx   . Prostate cancer Neg Hx    Social History   Socioeconomic History  . Marital status: Widowed    Spouse name: Not on file  . Number of children: 1  . Years of education: Not on file  . Highest education level: Not on file  Occupational History  . Occupation: retired  Tobacco Use  . Smoking status: Former Smoker    Packs/day: 0.25    Years: 3.00    Pack years: 0.75    Types: Cigarettes    Start date: 04/12/1959    Quit date: 08/12/1961    Years since quitting: 58.5  . Smokeless tobacco: Never Used  . Tobacco comment: during college  Vaping Use  . Vaping Use: Never used  Substance and Sexual Activity  . Alcohol use: Yes    Alcohol/week: 7.0 standard drinks    Types: 4 Glasses of wine, 3 Cans of beer per week    Comment: Social  . Drug use: No  . Sexual activity: Yes  Other Topics Concern  . Not on file  Social History Narrative   Lives alone. Has a daughter in [place] , [place] G-son   Daughter: Tamera Reason [phone]   Social Determinants of Health   Financial Resource Strain:   . Difficulty of Paying Living Expenses:   Food Insecurity:   . Worried About Sales executive in the Last Year:   . Arboriculturist in the Last Year:   Transportation Needs: No Transportation Needs  . Lack of Transportation (Medical): No  . Lack of Transportation (Non-Medical): No  Physical Activity:   . Days of Exercise per Week:   . Minutes of Exercise per Session:   Stress:   . Feeling of Stress :   Social Connections:   . Frequency of Communication with Friends and Family:   . Frequency of Social Gatherings with Friends and Family:   . Attends Religious Services:   .  Active Member of Clubs or Organizations:   . Attends Archivist Meetings:   Marland Kitchen Marital Status:     Tobacco Counseling Counseling given: Not Answered Comment: during college   Clinical Intake:     Pain : No/denies pain    Activities of Daily Living In your present state of health, do you have any difficulty performing the following activities: 02/02/2020  Hearing? N  Vision? N  Difficulty concentrating or making decisions? N  Walking or climbing stairs? N  Dressing or bathing? N  Doing errands, shopping? N  Preparing Food and eating ? N  Using the Toilet? N  In the past six months, have you accidently leaked urine? N  Do you have problems with loss of bowel control? N  Managing your Medications? N  Managing your Finances? N  Housekeeping or managing your Housekeeping? N  Some recent data might be hidden    Patient Care Team: Colon Branch, MD as PCP - General (Internal Medicine) Josue Hector, MD as PCP - Cardiology (Cardiology) Volanda Napoleon, MD as Consulting Physician (Oncology) Nickie Retort, MD as Consulting Physician (Urology)  Indicate any recent Medical Services you may have received from other than [facility] providers in the past year (date may be approximate).     Assessment:   This is a routine wellness examination for Benjamin Adkins.  Hearing/Vision screen No exam data present  Dietary issues and exercise activities discussed: Current Exercise Habits: Home  exercise routine, Time (Minutes): 30, Intensity: Mild, Exercise limited by: None identified Diet (meal preparation, eat out, water intake, caffeinated beverages, dairy products, fruits and vegetables): in general, a "healthy" diet     Goals    . Weight (lb) < 195 lb (88.5 kg)      Depression Screen PHQ 2/9 Scores 11/25/2018 01/14/2018 11/18/2016 05/06/2016 12/26/2015 04/23/2015 02/23/2015  PHQ - 2 Score 0 0 0 0 0 0 0    Fall Risk Fall Risk  04/05/2019 11/25/2018 01/14/2018 11/19/2017 11/18/2016  Falls in the past year? 0 0 No No No  Follow up Falls evaluation completed - - - -   Lives alone in 2 stoy home.  Any stairs in or around the home? Yes  If so, are there any without handrails? No  Home free of loose throw rugs in walkways, pet beds, electrical cords, etc? Yes  Adequate lighting in your home to reduce risk of falls? Yes    Cognitive Function: Ad8 score reviewed for issues:  Issues making decisions:non  Less interest in hobbies / activities:no  Repeats questions, stories (family complaining):no  Trouble using ordinary gadgets (microwave, computer, phone):no  Forgets the month or year: no  Mismanaging finances: no  Remembering appts:no  Daily problems with thinking and/or memory:no Ad8 score is=0        Immunizations Immunization History  Administered Date(s) Administered  . Fluad Quad(high Dose 65+) 03/17/2019  . Influenza Split 05/27/2011, 04/02/2012  . Influenza Whole 05/04/2007, 05/18/2008, 05/22/2009, 05/16/2010  . Influenza, High Dose Seasonal PF 04/28/2013, 04/23/2015, 04/10/2016  . Influenza,inj,Quad PF,6+ Mos 08/10/2014  . Influenza-Unspecified 04/28/2017, 04/29/2018  . Moderna SARS-COVID-2 Vaccination 08/01/2019, 08/29/2019  . Pneumococcal Conjugate-13 04/23/2015  . Pneumococcal Polysaccharide-23 08/09/2013  . Tetanus 08/23/2013  . Zoster 07/02/2011  . Zoster Recombinat (Shingrix) 04/16/2019, 10/01/2019    TDAP status: Up to date Flu Vaccine status: Up to  date Pneumococcal vaccine status: Up to date Covid-19 vaccine status: Completed vaccines  Qualifies for Shingles Vaccine? Yes   Zostavax completed Yes  Shingrix Completed?: Yes  Screening Tests Health Maintenance  Topic Date Due  . Hepatitis C Screening  Never done  . INFLUENZA VACCINE  02/19/2020  . TETANUS/TDAP  08/24/2023  . COVID-19 Vaccine  Completed  . PNA vac Low Risk Adult  Completed    Health Maintenance  Health Maintenance Due  Topic Date Due  . Hepatitis C Screening  Never done    Colorectal cancer screening: Completed 04/17/16. Repeat every 5 years  Lung Cancer Screening: (Low Dose CT Chest recommended if Age 79-80 years, 30 pack-year currently smoking OR have quit w/in 15years.) does not qualify.    Additional Screening:  Vision Screening: Recommended annual ophthalmology exams for early detection of glaucoma and other disorders of the eye. Is the patient up to date with their annual eye exam?  Yes  Who is the provider or what is the name of the office in which the patient attends annual eye exams? [facility]  Dental Screening: Recommended annual dental exams for proper oral hygiene  Community Resource Referral / Chronic Care Management: CRR required this visit?  No   CCM required this visit?  No      Plan:   See you next year!  Keep up the great work!   I have personally reviewed and noted the following in the patient's chart:   . Medical and social history . Use of alcohol, tobacco or illicit drugs  . Current medications and supplements . Functional ability and status . Nutritional status . Physical activity . Advanced directives . List of other physicians . Hospitalizations, surgeries, and ER visits in previous 12 months . Vitals . Screenings to include cognitive, depression, and falls . Referrals and appointments  In addition, I have reviewed and discussed with patient certain preventive protocols, quality metrics, and best  practice recommendations. A written personalized care plan for preventive services as well as general preventive health recommendations were provided to patient.   Due to this being a telephonic visit, the after visit summary with patients personalized plan was offered to patient via mail or my-chart.  Patient would like to access on my-chart    Shela Nevin, [place]   02/02/2020   Nurse Notes: Gita Kudo at least once per week. Also uses a recumbent bike at home.  Pt requesting referral to dermatology for annual maintenance check ups. Verbal received from Dr.Paz. order placed.  Kathlene November, MD

## 2020-02-02 ENCOUNTER — Encounter: Payer: Self-pay | Admitting: *Deleted

## 2020-02-02 ENCOUNTER — Other Ambulatory Visit: Payer: Self-pay

## 2020-02-02 ENCOUNTER — Ambulatory Visit (INDEPENDENT_AMBULATORY_CARE_PROVIDER_SITE_OTHER): Payer: Medicare PPO | Admitting: *Deleted

## 2020-02-02 VITALS — Wt 197.0 lb

## 2020-02-02 DIAGNOSIS — Z1283 Encounter for screening for malignant neoplasm of skin: Secondary | ICD-10-CM

## 2020-02-02 DIAGNOSIS — Z0000 Encounter for general adult medical examination without abnormal findings: Secondary | ICD-10-CM

## 2020-02-02 NOTE — Addendum Note (Signed)
Addended by: Naaman Plummer A on: 02/02/2020 04:03 PM   Modules accepted: Orders

## 2020-02-02 NOTE — Patient Instructions (Signed)
See you next year!  Keep up the great work!   Benjamin Adkins , Thank you for taking time to come for your Medicare Wellness Visit. I appreciate your ongoing commitment to your health goals. Please review the following plan we discussed and let me know if I can assist you in the future.   These are the goals we discussed: Goals    . Weight (lb) < 195 lb (88.5 kg)       This is a list of the screening recommended for you and due dates:  Health Maintenance  Topic Date Due  .  Hepatitis C: One time screening is recommended by Center for Disease Control  (CDC) for  adults born from [birth year] through 1965.   Never done  . Flu Shot  02/19/2020  . Tetanus Vaccine  08/24/2023  . COVID-19 Vaccine  Completed  . Pneumonia vaccines  Completed    Preventive Care 50 Years and Older, Male Preventive care refers to lifestyle choices and visits with your health care provider that can promote health and wellness. This includes:  A yearly physical exam. This is also called an annual well check.  Regular dental and eye exams.  Immunizations.  Screening for certain conditions.  Healthy lifestyle choices, such as diet and exercise. What can I expect for my preventive care visit? Physical exam Your health care provider will check:  Height and weight. These may be used to calculate body mass index (BMI), which is a measurement that tells if you are at a healthy weight.  Heart rate and blood pressure.  Your skin for abnormal spots. Counseling Your health care provider may ask you questions about:  Alcohol, tobacco, and drug use.  Emotional well-being.  Home and relationship well-being.  Sexual activity.  Eating habits.  History of falls.  Memory and ability to understand (cognition).  Work and work Statistician. What immunizations do I need?  Influenza (flu) vaccine  This is recommended every year. Tetanus, diphtheria, and pertussis (Tdap) vaccine  You may need a Td booster every 10  years. Varicella (chickenpox) vaccine  You may need this vaccine if you have not already been vaccinated. Zoster (shingles) vaccine  You may need this after age 48. Pneumococcal conjugate (PCV13) vaccine  One dose is recommended after age 28. Pneumococcal polysaccharide (PPSV23) vaccine  One dose is recommended after age 35. Measles, mumps, and rubella (MMR) vaccine  You may need at least one dose of MMR if you were born in 1957 or later. You may also need a second dose. Meningococcal conjugate (MenACWY) vaccine  You may need this if you have certain conditions. Hepatitis A vaccine  You may need this if you have certain conditions or if you travel or work in places where you may be exposed to hepatitis A. Hepatitis B vaccine  You may need this if you have certain conditions or if you travel or work in places where you may be exposed to hepatitis B. Haemophilus influenzae type b (Hib) vaccine  You may need this if you have certain conditions. You may receive vaccines as individual doses or as more than one vaccine together in one shot (combination vaccines). Talk with your health care provider about the risks and benefits of combination vaccines. What tests do I need? Blood tests  Lipid and cholesterol levels. These may be checked every 5 years, or more frequently depending on your overall health.  Hepatitis C test.  Hepatitis B test. Screening  Lung cancer screening. You may have  this screening every year starting at age 55 if you have a 30-pack-year history of smoking and currently smoke or have quit within the past 15 years.  Colorectal cancer screening. All adults should have this screening starting at age 50 and continuing until age 75. Your health care provider may recommend screening at age 45 if you are at increased risk. You will have tests every 1-10 years, depending on your results and the type of screening test.  Prostate cancer screening. Recommendations will  vary depending on your family history and other risks.  Diabetes screening. This is done by checking your blood sugar (glucose) after you have not eaten for a while (fasting). You may have this done every 1-3 years.  Abdominal aortic aneurysm (AAA) screening. You may need this if you are a current or former smoker.  Sexually transmitted disease (STD) testing. Follow these instructions at home: Eating and drinking  Eat a diet that includes fresh fruits and vegetables, whole grains, lean protein, and low-fat dairy products. Limit your intake of foods with high amounts of sugar, saturated fats, and salt.  Take vitamin and mineral supplements as recommended by your health care provider.  Do not drink alcohol if your health care provider tells you not to drink.  If you drink alcohol: ? Limit how much you have to 0-2 drinks a day. ? Be aware of how much alcohol is in your drink. In the [place] one drink equals one 12 oz bottle of beer (355 mL), one 5 oz glass of wine (148 mL), or one 1 oz glass of hard liquor (44 mL). Lifestyle  Take daily care of your teeth and gums.  Stay active. Exercise for at least 30 minutes on 5 or more days each week.  Do not use any products that contain nicotine or tobacco, such as cigarettes, e-cigarettes, and chewing tobacco. If you need help quitting, ask your health care provider.  If you are sexually active, practice safe sex. Use a condom or other form of protection to prevent STIs (sexually transmitted infections).  Talk with your health care provider about taking a low-dose aspirin or statin. What's next?  Visit your health care provider once a year for a well check visit.  Ask your health care provider how often you should have your eyes and teeth checked.  Stay up to date on all vaccines. This information is not intended to replace advice given to you by your health care provider. Make sure you discuss any questions you have with your health care  provider. Document Revised: 07/01/2018 Document Reviewed: 07/01/2018 Elsevier Patient Education  2020 Elsevier Inc.  

## 2020-02-03 ENCOUNTER — Other Ambulatory Visit: Payer: Self-pay

## 2020-02-03 DIAGNOSIS — Z1283 Encounter for screening for malignant neoplasm of skin: Secondary | ICD-10-CM

## 2020-05-15 ENCOUNTER — Telehealth: Payer: Self-pay | Admitting: Internal Medicine

## 2020-05-15 NOTE — Telephone Encounter (Signed)
The latest recommendation of the CDC advised to proceed with the third modern vaccine, I agree.  Let him know

## 2020-05-15 NOTE — Telephone Encounter (Signed)
Wants to know if you recommend for him to take the 3rd covid shot.

## 2020-05-15 NOTE — Telephone Encounter (Signed)
Please advise 

## 2020-05-16 NOTE — Telephone Encounter (Signed)
LMOM informing Pt of PCP recommendations.  

## 2020-06-06 ENCOUNTER — Telehealth: Payer: Self-pay

## 2020-06-06 ENCOUNTER — Other Ambulatory Visit: Payer: Self-pay | Admitting: Internal Medicine

## 2020-06-06 MED ORDER — OMEPRAZOLE 20 MG PO CPDR: 20.0000 mg | DELAYED_RELEASE_CAPSULE | Freq: Every day | ORAL | 3 refills | Status: DC

## 2020-06-06 NOTE — Telephone Encounter (Signed)
Refill Request-   MEDICATION: omeprazole 20 MG  PHARMACY: [facility]  Comments:   **Let patient know to contact pharmacy at the end of the day to make sure medication is ready. **  ** Please notify patient to allow 48-72 hours to process**  **Encourage patient to contact the pharmacy for refills or they can request refills through [facility]**

## 2020-06-06 NOTE — Telephone Encounter (Signed)
Rx sent 

## 2020-07-11 DIAGNOSIS — G4733 Obstructive sleep apnea (adult) (pediatric): Secondary | ICD-10-CM | POA: Diagnosis not present

## 2020-08-02 ENCOUNTER — Other Ambulatory Visit: Payer: Self-pay | Admitting: Internal Medicine

## 2020-08-14 ENCOUNTER — Other Ambulatory Visit: Payer: Self-pay

## 2020-08-14 ENCOUNTER — Ambulatory Visit (INDEPENDENT_AMBULATORY_CARE_PROVIDER_SITE_OTHER): Payer: Medicare PPO | Admitting: Internal Medicine

## 2020-08-14 ENCOUNTER — Encounter: Payer: Self-pay | Admitting: Internal Medicine

## 2020-08-14 VITALS — BP 144/84 | HR 62 | Temp 98.1°F | Resp 16 | Ht 69.0 in | Wt 203.1 lb

## 2020-08-14 DIAGNOSIS — R972 Elevated prostate specific antigen [PSA]: Secondary | ICD-10-CM | POA: Diagnosis not present

## 2020-08-14 DIAGNOSIS — I25810 Atherosclerosis of coronary artery bypass graft(s) without angina pectoris: Secondary | ICD-10-CM | POA: Diagnosis not present

## 2020-08-14 DIAGNOSIS — E785 Hyperlipidemia, unspecified: Secondary | ICD-10-CM

## 2020-08-14 DIAGNOSIS — R739 Hyperglycemia, unspecified: Secondary | ICD-10-CM | POA: Diagnosis not present

## 2020-08-14 DIAGNOSIS — Z1159 Encounter for screening for other viral diseases: Secondary | ICD-10-CM | POA: Diagnosis not present

## 2020-08-14 DIAGNOSIS — Z0000 Encounter for general adult medical examination without abnormal findings: Secondary | ICD-10-CM | POA: Diagnosis not present

## 2020-08-14 LAB — LIPID PANEL
Cholesterol: 120 mg/dL (ref 0–200)
HDL: 45.6 mg/dL (ref >39.00)
LDL Cholesterol: 55 mg/dL (ref 0–99)
NonHDL: 74.82
Total CHOL/HDL Ratio: 3
Triglycerides: 97 mg/dL (ref 0.0–149.0)
VLDL: 19.4 mg/dL (ref 0.0–40.0)

## 2020-08-14 LAB — CBC WITH DIFFERENTIAL/PLATELET
Basophils Absolute: 0 10*3/uL (ref 0.0–0.1)
Basophils Relative: 0.3 % (ref 0.0–3.0)
Eosinophils Absolute: 0.2 10*3/uL (ref 0.0–0.7)
Eosinophils Relative: 2.1 % (ref 0.0–5.0)
HCT: 44.5 % (ref 39.0–52.0)
Hemoglobin: 15.4 g/dL (ref 13.0–17.0)
Lymphocytes Relative: 21.6 % (ref 12.0–46.0)
Lymphs Abs: 1.6 10*3/uL (ref 0.7–4.0)
MCHC: 34.7 g/dL (ref 30.0–36.0)
MCV: 91.1 fl (ref 78.0–100.0)
Monocytes Absolute: 0.7 10*3/uL (ref 0.1–1.0)
Monocytes Relative: 9.8 % (ref 3.0–12.0)
Neutro Abs: 4.9 10*3/uL (ref 1.4–7.7)
Neutrophils Relative %: 66.2 % (ref 43.0–77.0)
Platelets: 147 10*3/uL — ABNORMAL LOW (ref 150.0–400.0)
RBC: 4.89 Mil/uL (ref 4.22–5.81)
RDW: 13.3 % (ref 11.5–15.5)
WBC: 7.5 10*3/uL (ref 4.0–10.5)

## 2020-08-14 LAB — COMPREHENSIVE METABOLIC PANEL
ALT: 23 U/L (ref 0–53)
AST: 24 U/L (ref 0–37)
Albumin: 4.5 g/dL (ref 3.5–5.2)
Alkaline Phosphatase: 73 U/L (ref 39–117)
BUN: 17 mg/dL (ref 6–23)
CO2: 29 mEq/L (ref 19–32)
Calcium: 9.9 mg/dL (ref 8.4–10.5)
Chloride: 101 mEq/L (ref 96–112)
Creatinine, Ser: 0.96 mg/dL (ref 0.40–1.50)
GFR: 75.3 mL/min (ref >60.00)
Glucose, Bld: 97 mg/dL (ref 70–99)
Potassium: 4.3 mEq/L (ref 3.5–5.1)
Sodium: 135 mEq/L (ref 135–145)
Total Bilirubin: 1 mg/dL (ref 0.2–1.2)
Total Protein: 6.9 g/dL (ref 6.0–8.3)

## 2020-08-14 LAB — HEMOGLOBIN A1C: Hgb A1c MFr Bld: 5.7 % (ref 4.6–6.5)

## 2020-08-14 LAB — PSA: PSA: 3.78 ng/mL (ref 0.10–4.00)

## 2020-08-14 LAB — TSH: TSH: 1.88 u[IU]/mL (ref 0.35–4.50)

## 2020-08-14 NOTE — Progress Notes (Unsigned)
Pre visit review using our clinic review tool, if applicable. No additional management support is needed unless otherwise documented below in the visit note. 

## 2020-08-14 NOTE — Patient Instructions (Addendum)
Check the  blood pressure 2 or 3 times a month   BP GOAL is between 110/65 and  135/85. If it is consistently higher or lower, let me know     GO TO THE LAB : Get the blood work     GO TO THE FRONT DESK, PLEASE SCHEDULE YOUR APPOINTMENTS Come back for   a physical exam in 1 year. 

## 2020-08-14 NOTE — Progress Notes (Signed)
Subjective:    Patient ID: Benjamin Adkins, male    DOB: 1940-12-04, 80 y.o.   MRN: [id number]  DOS:  08/14/2020 Type of visit - description: CPX  Since the last office visit he is doing well.  Has no major concerns.  BP Readings from Last 3 Encounters:  08/14/20 (!) 144/84  09/07/19 122/80  08/11/19 137/81    Review of Systems  A 14 point review of systems is negative    Past Medical History:  Diagnosis Date  . Coronary atherosclerosis of unspecified type of vessel, native or graft    MI 2006, h/o CABG  . DJD (degenerative joint disease)   . Elevated prostate specific antigen (PSA)   . Elevated PSA   . Esophageal reflux   . HTN (hypertension)   . Hyperlipidemia    nmr lipoprofile 2004: LDL 154 (1653/711), HDL 42, TG 80.NMR  LDL =,125, but PMH  of MI LDL goal=,70  . Hypertension   . Myocardial infarction ([place])    2006  . Other cataract   . Personal history of colonic polyps   . Shortness of breath    DOE  . Sleep apnea    uses CPAP    Past Surgical History:  Procedure Laterality Date  . CARDIAC CATHETERIZATION     2006  . CATARACT EXTRACTION Bilateral   . COLONOSCOPY W/ POLYPECTOMY    . CORONARY ARTERY BYPASS GRAFT  2006   X 4   . HERNIA REPAIR     VENTRAL, LEFT & RIGHT GROIN  . INGUINAL HERNIA REPAIR    . TOTAL KNEE ARTHROPLASTY  2005  . TOTAL KNEE ARTHROPLASTY  2006  . TOTAL KNEE ARTHROPLASTY  08/25/2011   Procedure: TOTAL KNEE ARTHROPLASTY;  Surgeon: Lorn Junes, MD;  Location: [facility];  Service: Orthopedics;  Laterality: Right;  DR Noemi Chapel WANTS 90 MINUTES FOR SURGERY    Allergies as of 08/14/2020   No Known Allergies     Medication List       Accurate as of August 14, 2020 11:59 PM. If you have any questions, ask your nurse or doctor.        amoxicillin 500 MG capsule Commonly known as: AMOXIL TAKE 4 CAPSULES BY MOUTH AS NEEDED FOR DENTAL PROCEDURE   aspirin 325 MG EC tablet Take 325 mg by mouth daily.   atorvastatin 40 MG  tablet Commonly known as: LIPITOR Take 1 tablet (40 mg total) by mouth daily.   celecoxib 100 MG capsule Commonly known as: CELEBREX Take 1 capsule (100 mg total) by mouth 2 (two) times daily as needed for moderate pain.   ezetimibe 10 MG tablet Commonly known as: ZETIA Take 1 tablet (10 mg total) by mouth daily.   metoprolol succinate 25 MG 24 hr tablet Commonly known as: TOPROL-XL Take 1 tablet (25 mg total) by mouth daily.   multivitamin capsule Take 1 capsule by mouth daily.   nitroGLYCERIN 0.4 MG SL tablet Commonly known as: NITROSTAT Place 1 tablet (0.4 mg total) under the tongue every 5 (five) minutes as needed.   omeprazole 20 MG capsule Commonly known as: PRILOSEC Take 1 capsule (20 mg total) by mouth daily before breakfast.          Objective:   Physical Exam BP (!) 144/84 (BP Location: Left Arm, Patient Position: Sitting, Cuff Size: Normal)   Pulse 62   Temp 98.1 F (36.7 C) (Oral)   Resp 16   Ht 5\' 9"  (1.753 m)  Wt 203 lb 2 oz (92.1 kg)   SpO2 96%   BMI 30.00 kg/m      General: Well developed, NAD, BMI noted Neck: No  thyromegaly  HEENT:  Normocephalic . Face symmetric, atraumatic Lungs:  CTA B Normal respiratory effort, no intercostal retractions, no accessory muscle use. Heart: RRR,  no murmur.  Abdomen:  Not distended, soft, non-tender. No rebound or rigidity.   Lower extremities: no pretibial edema bilaterally  Skin: Exposed areas without rash. Not pale. Not jaundice Neurologic:  alert & oriented X3.  Speech normal, gait appropriate for age and unassisted Strength symmetric and appropriate for age.  Psych: Cognition and judgment appear intact.  Cooperative with normal attention span and concentration.  Behavior appropriate. No anxious or depressed appearing.  Assessment      Assessment  Prediabetes HTN Hyperlipidemia CAD, MI 2006, CABG, RBBB OSA , on CPAP DJD Knee replacement-- on abx pre-dental procedures and: BP today is  very good, continue Elevated PSA  PLAN: Here for CPX Prediabetes: Check A1c HTN: BP slightly elevated today, no recent ambulatory BPs, continue metoprolol, monitor BPs, see AVS Hyperlipidemia: Nonfasting, on Lipitor/Zetia, check a lipid panel CAD: Asx, will see cardiology soon. OSA: Reports good compliance with CPAP Elevated PSA: See comments under Annual exam DJD: On Celebrex as needed RTC 1 year    This visit occurred during the SARS-CoV-2 public health emergency.  Safety protocols were in place, including screening questions prior to the visit, additional usage of staff PPE, and extensive cleaning of exam room while observing appropriate contact time as indicated for disinfecting solutions.

## 2020-08-15 ENCOUNTER — Encounter: Payer: Self-pay | Admitting: Internal Medicine

## 2020-08-15 LAB — HEPATITIS C ANTIBODY
Hepatitis C Ab: NONREACTIVE
SIGNAL TO CUT-OFF: 0.01 (ref <1.00)

## 2020-08-15 NOTE — Assessment & Plan Note (Signed)
Here for CPX Prediabetes: Check A1c HTN: BP slightly elevated today, no recent ambulatory BPs, continue metoprolol, monitor BPs, see AVS Hyperlipidemia: Nonfasting, on Lipitor/Zetia, check a lipid panel CAD: Asx, will see cardiology soon. OSA: Reports good compliance with CPAP Elevated PSA: See comments under Annual exam DJD: On Celebrex as needed RTC 1 year

## 2020-08-15 NOTE — Assessment & Plan Note (Signed)
-  Tdap: 08/2103 - PNA23: 07/2013 ; prevnar:04-2015 -zostavax:06/2011; Shingrix x2 - moderna covid shot x3 - Had a Flu shot  -CCS:  cscope 02/2006 Diverticulosis cscope 04-2016- 1 polyp, next due 04/2021, not sure if will proceed   -prostate ca screening: PSA is elevated, saw urology 08/2019, DRE was wnl. Pt unerstood no f/u w/ urology was needed but office note stated to RTC in 1 year. PSA back in 2019 was 5.4, then it decreased stable at around 4.08  on 08-2019. Plan: Check PSA. -labs: A1c, CMP, lipid panel, CBC, TSH, PSA, hep C -Diet and exercise: Discussed

## 2020-08-20 ENCOUNTER — Encounter: Payer: Self-pay | Admitting: Internal Medicine

## 2020-09-18 ENCOUNTER — Other Ambulatory Visit: Payer: Self-pay | Admitting: Internal Medicine

## 2020-10-29 NOTE — Progress Notes (Signed)
Patient ID: DERL ABALOS, male   DOB: 1941/04/05, 80 y.o.   MRN: [id number]     This is a 80 y.o.  white male patient f/U CAD  In 2005 he had a perioperative MI after left knee replacement. He required urgent stenting to the RCA and subsequent CABG. He did have a negative treadmill prior to his knee surgery in 2005. He has done well since that time. He has a history of a right bundle branch block.his lipids are controlled. He has no history of diabetes and does not smoke.   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  All other systems reviewed and negative  General: BP 132/82   Pulse 61   Ht 5\' 9"  (1.753 m)   Wt 91.8 kg   SpO2 98%   BMI 29.89 kg/m  Affect appropriate Healthy:  appears stated age 80: normal Neck supple with no adenopathy JVP normal no bruits no thyromegaly Lungs clear with no wheezing and good diaphragmatic motion Heart:  S1/S2 no murmur, no rub, gallop or click PMI normal Abdomen: benighn, BS positve, no tenderness, no AAA no bruit.  No HSM or HJR Distal pulses intact with no bruits No  edema Neuro non-focal Skin warm and dry No muscular weakness Post bilateral TKR;s     Current Outpatient Medications  Medication Sig Dispense Refill  . amoxicillin (AMOXIL) 500 MG capsule TAKE 4 CAPSULES BY MOUTH AS NEEDED FOR DENTAL PROCEDURE 20 capsule 0  . aspirin 325 MG EC tablet Take 325 mg by mouth daily.    Marland Kitchen atorvastatin (LIPITOR) 40 MG tablet Take 1 tablet (40 mg total) by mouth daily. 90 tablet 3  . celecoxib (CELEBREX) 100 MG capsule Take 1 capsule (100 mg total) by mouth 2 (two) times daily as needed for moderate pain. 180 capsule 1  . ezetimibe (ZETIA) 10 MG tablet Take 1 tablet (10 mg total) by mouth daily. 90 tablet 3  . metoprolol succinate (TOPROL-XL) 25 MG 24 hr tablet Take 1 tablet (25 mg total) by mouth daily. 90 tablet 3  . Multiple Vitamin (MULTIVITAMIN) capsule Take 1 capsule by mouth daily.    . nitroGLYCERIN (NITROSTAT) 0.4 MG SL tablet Place 1 tablet (0.4 mg total) under the tongue every 5 (five) minutes as needed. 25 tablet 3  . omeprazole (PRILOSEC) 20 MG capsule Take 1 capsule (20 mg total) by mouth daily before breakfast. 90 capsule 3   No current facility-administered medications for this visit.    Allergies  Patient has no known allergies.  Electrocardiogram:   09/07/19  SR  rate 64 RBBB LAFB old ILMI  Assessment and Plan CAD:  Stable with no angina and good activity level.  Continue medical Rx last myovue 2013 old IMI no ischemia  He is active and does not want stress test as he had normal one right before his MI No indication for cath at this time   Chol:  On statin LDL at goal 08/14/20 55  on lipitor 40 mg and zetia 10 mg labs with primary   GERD:  On prilosec discussed low carb diet   RBBB: chronic no high grade AV block yearly ECG   PSA:  Chronically elevated 3.78 08/14/20 follow   F/U with me in a year  Jenkins Rouge

## 2020-11-08 ENCOUNTER — Encounter: Payer: Self-pay | Admitting: Cardiovascular Disease

## 2020-11-08 ENCOUNTER — Ambulatory Visit (INDEPENDENT_AMBULATORY_CARE_PROVIDER_SITE_OTHER): Payer: Medicare PPO | Admitting: Cardiovascular Disease

## 2020-11-08 ENCOUNTER — Other Ambulatory Visit: Payer: Self-pay

## 2020-11-08 VITALS — BP 132/82 | HR 61 | Ht 69.0 in | Wt 202.4 lb

## 2020-11-08 DIAGNOSIS — I10 Essential (primary) hypertension: Secondary | ICD-10-CM | POA: Diagnosis not present

## 2020-11-08 DIAGNOSIS — I4510 Unspecified right bundle-branch block: Secondary | ICD-10-CM

## 2020-11-08 DIAGNOSIS — I25810 Atherosclerosis of coronary artery bypass graft(s) without angina pectoris: Secondary | ICD-10-CM | POA: Diagnosis not present

## 2020-11-08 NOTE — Patient Instructions (Signed)

## 2020-11-21 ENCOUNTER — Other Ambulatory Visit: Payer: Self-pay | Admitting: Cardiovascular Disease

## 2020-12-16 ENCOUNTER — Telehealth: Payer: Self-pay | Admitting: Family Medicine

## 2020-12-16 MED ORDER — NIRMATRELVIR/RITONAVIR (PAXLOVID)TABLET: 3.0000 | ORAL_TABLET | Freq: Two times a day (BID) | ORAL | 0 refills | Status: AC

## 2020-12-16 NOTE — Telephone Encounter (Signed)
Patient with testing confirming covid 19 with first day of covid 19 symptoms 27th of may- worse today Symptoms include chills, dizzy, congestion, runny nose, pulled muscle in back last week and that seems to be worse Vaccination status: moderna x3 most recent October 2021   Therefore: - recommended patient watch closely for shortness of breath or confusion or worsening symptoms and if those occur patient should contact us immediately or seek care in the emergency department -recommended patient consider purchasing pulse oximeter and if levels 94% or below persistently- seek care at the hospital - Patient needs to self isolate  for at least 5 days since first symptom AND at least 24 hours fever free without fever reducing medications AND have improvement in respiratory symptoms . After 5 days can end self isolation but still needs to wear mask for additional 5 days .  -Patient should inform close contacts about exposure (anyone patient been around unmasked for more than 15 minutes)   If High risk for complications-we discussed outpatient therapeutic options including paxlovid (and risk of rebound), molnupiravir, MAB infusion - patient opted for paxlovid - rx sent in electronically - advised patient to consider calling for follow up appointment with Dr. Larose Kells or one of his colleagues this week if further concerns or not improving

## 2020-12-17 ENCOUNTER — Encounter: Payer: Self-pay | Admitting: Internal Medicine

## 2020-12-18 ENCOUNTER — Telehealth: Payer: Self-pay

## 2020-12-18 NOTE — Telephone Encounter (Signed)
Patient / Member / Family / Caregiver Call Type Triage / Clinical Relationship To Patient Self Return Phone Number [phone] (Primary) Chief Complaint Back Injury Reason for Call Symptomatic / Request for Health Information Initial Comment Pt tested positive for covid via an at-home test. He woke up this morning with flu-like symptoms. He c/o runny nose and body aches. Took Ibu. Also, he pulled a muscle in his back at the gym last week. Seems more painful. If he moves, he has sharp pain almost stops him from talking. Translation No Nurse Assessment Nurse: Beams, RN, April Date/Time (Eastern Time): 12/16/2020 12:41:33 PM Confirm and document reason for call. If symptomatic, describe symptoms. ---Pt tested positive for covid via an at-home test. He woke up this morning with flu-like symptoms. He c/o runny nose and body aches. Took motrin. Also, he pulled a muscle in his back at the gym last week. Seems more painful. If he moves, he has sharp pain almost stops him from talking. Does the patient have any new or worsening symptoms? ---Yes Will a triage be completed? ---Yes Related visit to physician within the last 2 weeks? ---No Does the PT have any chronic conditions? (i.e. diabetes, asthma, this includes High risk factors for pregnancy, etc.) ---No Is this a behavioral health or substance abuse call? ---No Guidelines Guideline Title Affirmed Question Affirmed Notes Nurse Date/Time (Eastern Time) COVID-19 - Diagnosed or Suspected [1] HIGH RISK for severe COVID complications (e.g., weak immune system, age > 64 years, obesity Beams, RN, April 12/16/2020 12:44:06 

## 2020-12-18 NOTE — Telephone Encounter (Signed)
See my chart message

## 2020-12-18 NOTE — Telephone Encounter (Signed)
Thank you for taking care of him.

## 2020-12-19 ENCOUNTER — Encounter: Payer: Self-pay | Admitting: Internal Medicine

## 2020-12-21 ENCOUNTER — Telehealth: Payer: Self-pay | Admitting: Internal Medicine

## 2020-12-21 DIAGNOSIS — G4733 Obstructive sleep apnea (adult) (pediatric): Secondary | ICD-10-CM | POA: Diagnosis not present

## 2020-12-21 NOTE — Telephone Encounter (Signed)
Pt would like on call with recommendations on what he can do. States he has taking all of the covid drug and he is still testing positive for covid. States he is out of the covid test now as well.

## 2020-12-21 NOTE — Telephone Encounter (Signed)
Spoke w/ Pt- informed that he may continue to test positive for covid for several weeks after. Pt verbalized understanding.

## 2020-12-24 ENCOUNTER — Encounter: Payer: Self-pay | Admitting: Internal Medicine

## 2020-12-24 ENCOUNTER — Other Ambulatory Visit: Payer: Self-pay

## 2020-12-24 ENCOUNTER — Ambulatory Visit: Payer: Medicare PPO | Admitting: Internal Medicine

## 2020-12-24 VITALS — BP 144/70 | HR 66 | Temp 98.0°F | Resp 16 | Ht 69.0 in | Wt 198.0 lb

## 2020-12-24 DIAGNOSIS — I10 Essential (primary) hypertension: Secondary | ICD-10-CM

## 2020-12-24 DIAGNOSIS — S233XXA Sprain of ligaments of thoracic spine, initial encounter: Secondary | ICD-10-CM

## 2020-12-24 DIAGNOSIS — U071 COVID-19: Secondary | ICD-10-CM

## 2020-12-24 NOTE — Patient Instructions (Signed)
Please proceed with a Covid booster in 4 months  Get a flu shot this fall

## 2020-12-24 NOTE — Progress Notes (Signed)
Subjective:    Patient ID: Benjamin Adkins, male    DOB: [birth year], 80 y.o.   MRN: [id number]  DOS:  12/24/2020 Type of visit - description: Acute  The patient developed severe pain at the R thoracic paraspinal area about 2 weeks ago. Pain was triggered by moving his torso or his arms. Subsequently he developed COVID, was rx paxlovid and he felt much better 12 hours after he started the medication  Overall, the thoracic pain has improved 95%. Regards  COVID, he denies any fever chills at this point No chest pain no difficulty breathing No cough He is not aware of any rash on his back  Review of Systems See above   Past Medical History:  Diagnosis Date  . Coronary atherosclerosis of unspecified type of vessel, native or graft    MI 2006, h/o CABG  . DJD (degenerative joint disease)   . Elevated prostate specific antigen (PSA)   . Elevated PSA   . Esophageal reflux   . HTN (hypertension)   . Hyperlipidemia    nmr lipoprofile 2004: LDL 154 (1653/711), HDL 42, TG 80.NMR  LDL =,125, but PMH  of MI LDL goal=,70  . Hypertension   . Myocardial infarction ([place])    2006  . Other cataract   . Personal history of colonic polyps   . Shortness of breath    DOE  . Sleep apnea    uses CPAP    Past Surgical History:  Procedure Laterality Date  . CARDIAC CATHETERIZATION     2006  . CATARACT EXTRACTION Bilateral   . COLONOSCOPY W/ POLYPECTOMY    . CORONARY ARTERY BYPASS GRAFT  2006   X 4   . HERNIA REPAIR     VENTRAL, LEFT & RIGHT GROIN  . INGUINAL HERNIA REPAIR    . TOTAL KNEE ARTHROPLASTY  2005  . TOTAL KNEE ARTHROPLASTY  2006  . TOTAL KNEE ARTHROPLASTY  08/25/2011   Procedure: TOTAL KNEE ARTHROPLASTY;  Surgeon: Lorn Junes, MD;  Location: [facility];  Service: Orthopedics;  Laterality: Right;  DR Noemi Chapel WANTS 90 MINUTES FOR SURGERY    Allergies as of 12/24/2020   No Known Allergies     Medication List       Accurate as of December 24, 2020 11:59 PM. If you have any questions,  ask your nurse or doctor.        amoxicillin 500 MG capsule Commonly known as: AMOXIL TAKE 4 CAPSULES BY MOUTH AS NEEDED FOR DENTAL PROCEDURE   aspirin 325 MG EC tablet Take 325 mg by mouth daily.   atorvastatin 40 MG tablet Commonly known as: LIPITOR TAKE 1 TABLET BY MOUTH EVERY DAY   celecoxib 100 MG capsule Commonly known as: CELEBREX Take 1 capsule (100 mg total) by mouth 2 (two) times daily as needed for moderate pain.   ezetimibe 10 MG tablet Commonly known as: ZETIA Take 1 tablet (10 mg total) by mouth daily.   metoprolol succinate 25 MG 24 hr tablet Commonly known as: TOPROL-XL Take 1 tablet (25 mg total) by mouth daily.   multivitamin capsule Take 1 capsule by mouth daily.   nitroGLYCERIN 0.4 MG SL tablet Commonly known as: NITROSTAT Place 1 tablet (0.4 mg total) under the tongue every 5 (five) minutes as needed.   omeprazole 20 MG capsule Commonly known as: PRILOSEC Take 1 capsule (20 mg total) by mouth daily before breakfast.          Objective:   Physical Exam  Musculoskeletal:       Arms:    BP (!) 144/70 (BP Location: Left Arm, Patient Position: Sitting, Cuff Size: Small)   Pulse 66   Temp 98 F (36.7 C) (Oral)   Resp 16   Ht 5\' 9"  (1.753 m)   Wt 198 lb (89.8 kg)   SpO2 98%   BMI 29.24 kg/m  General:   Well developed, NAD, BMI noted. HEENT:  Normocephalic . Face symmetric, atraumatic Lungs:  CTA B Normal respiratory effort, no intercostal retractions, no accessory muscle use. Heart: RRR,  no murmur.  Lower extremities: no pretibial edema bilaterally MSK: No TTP of the thoracic spine or paraspinal areas Skin: Not pale. Not jaundice Neurologic:  alert & oriented X3.  Speech normal, gait appropriate for age and unassisted Psych--  Cognition and judgment appear intact.  Cooperative with normal attention span and concentration.  Behavior appropriate. No anxious or depressed appearing.      Assessment    Assessment   Prediabetes HTN Hyperlipidemia CAD, MI 2006, CABG, RBBB OSA , on CPAP DJD Knee replacement-- on abx pre-dental procedures and: BP today is very good, continue Elevated PSA  PLAN: Sprain, thoracic spine: New issue Symptoms as described above, resolving spontaneously.  Recommend observation, continue stretching, call if symptoms resurface. COVID-19: Diagnosed approximately 8 days ago, took paxlovid, currently asymptomatic.  Rec to proceed with booster in 4 months. HTN:BP today is very good, continue metoprolol RTC by 07/2021 for CPX     This visit occurred during the SARS-CoV-2 public health emergency.  Safety protocols were in place, including screening questions prior to the visit, additional usage of staff PPE, and extensive cleaning of exam room while observing appropriate contact time as indicated for disinfecting solutions.

## 2020-12-25 ENCOUNTER — Other Ambulatory Visit: Payer: Self-pay | Admitting: Cardiovascular Disease

## 2020-12-25 NOTE — Assessment & Plan Note (Addendum)
Sprain, thoracic spine: New issue Symptoms as described above, resolving spontaneously.  Recommend observation, continue stretching, call if symptoms resurface. COVID-19: Diagnosed approximately 8 days ago, took paxlovid, currently asymptomatic.  Rec to proceed with booster in 4 months. HTN:BP today is very good, continue metoprolol RTC by 07/2021 for CPX

## 2021-01-03 ENCOUNTER — Telehealth: Payer: Self-pay

## 2021-01-03 MED ORDER — AZELASTINE HCL 0.1 % NA SOLN: 2.0000 | Freq: Two times a day (BID) | NASAL | 12 refills | Status: DC

## 2021-01-03 NOTE — Telephone Encounter (Signed)
The patient states he is still dealing with head congestion. He wants to know what else he should take for it. He is currently taking multi-symptom cold medication with no relief.

## 2021-01-03 NOTE — Addendum Note (Signed)
Addended byDamita Dunnings D on: 01/03/2021 10:50 AM   Modules accepted: Orders

## 2021-01-03 NOTE — Telephone Encounter (Signed)
Pt seen by PCP on 12/24/2020 for f/u- was doing well at that time, he was informed he does not need to continue testing for covid after taking Paxlovid as he may continue to test positive for several weeks after infection.

## 2021-01-03 NOTE — Telephone Encounter (Signed)
Nurse Assessment Nurse: Quincy Simmonds, RN, Annita Brod Date/Time Eilene Ghazi Time): 01/02/2021 7:38:08 PM Confirm and document reason for call. If symptomatic, describe symptoms. ---Caller states had COVID 3 wks ago, prescribe Paxlovid took for 5 days, nasal congestion for 3 to 4 days, and just took a test positive for COVID (head congestion for a few days). took advil multi sypt cold/ flu Does the patient have any new or worsening symptoms? ---Yes Will a triage be completed? ---Yes Related visit to physician within the last 2 weeks? ---Yes Does the PT have any chronic conditions? (i.e. diabetes, asthma, this includes High risk factors for pregnancy, etc.) ---Yes List chronic conditions. ---bypass surgery Is this a behavioral health or substance abuse call? ---No Guidelines Guideline Title Affirmed Question Affirmed Notes Nurse Date/Time (Eastern Time) COVID-19 - Persisting Symptoms Follow-up Call [1] PERSISTING SYMPTOMS OF COVID-19 AND [2] NEW symptom AND [3] could be serious Quincy Simmonds, RN, Annita Brod 01/02/2021 7:40:04 PM Sinus Pain or Congestion [1] Sinus congestion as part of a cold AND [2] present < 10 days Darien Ramus 01/02/2021 7:42:34 PM PLEASE NOTE: All timestamps contained within this report are represented as [place] Standard Time. CONFIDENTIALTY NOTICE: This fax transmission is intended only for the addressee. It contains information that is legally privileged, confidential or otherwise protected from use or disclosure. If you are not the intended recipient, you are strictly prohibited from reviewing, disclosing, copying using or disseminating any of this information or taking any action in reliance on or regarding this information. If you have received this fax in error, please notify us immediately by telephone so that we can arrange for its return to [place]. Phone: [phone], Toll-Free: [phone], Fax: [phone] Page: 2 of 3 Call Id: [id number] [place]. Time Eilene Ghazi Time)  Disposition Final User 01/02/2021 7:42:22 PM Call PCP Now Quincy Simmonds, RN, Annita Brod 01/02/2021 7:48:08 PM Paged On Call back to [facility], RN, Annita Brod 01/02/2021 [place], RN, Annita Brod Caller Disagree/Comply Comply Caller Understands Yes PreDisposition Call Doctor Care Advice Given Per Guideline CALL PCP NOW: * You need to discuss this with your doctor (or NP/PA). * I'll page the on-call provider now. If you haven't heard from the provider (or me) within 30 minutes, call again. ALTERNATE DISPOSITION - CALL TELEMEDICINE PROVIDER NOW: CALL BACK IF: * You become worse CARE ADVICE given per COVID-19 - Persisting Symptoms Follow-Up Call (Adult) guideline. HOME CARE: * You should be able to treat this at home. REASSURANCE AND EDUCATION - COLDS AND SINUS CONGESTION: * Sinus congestion is a normal part of a cold. * Usually home treatment with nasal washes can prevent an actual bacterial sinus infection. * Here is some care advice that should help. NASAL WASHES FOR A STUFFY NOSE: * Introduction: Saline (salt water) nasal irrigation (nasal wash) is an effective and simple home remedy for treating stuffy nose and sinus congestion. The nose can be irrigated by pouring, spraying, or squirting salt water into the nose and then letting it run back out. NASAL DECONGESTANTS FOR A VERY STUFFY NOSE: * MOST PEOPLE DO NOT NEED TO USE THESE MEDICINES. * If your nose feels blocked, you should try using nasal washes first. PAIN MEDICINES: * For pain relief, you can take either acetaminophen, ibuprofen, or naproxen. * They are over-the-counter (OTC) pain drugs. You can buy them at the drugstore. CARE ADVICE given per Sinus Pain or Congestion (Adult) guideline. CALL BACK IF: * You become worse * Sinus congestion (fullness) persists over 10 days * Sinus pain persists over 1  day after using nasal washes * Severe pain persists over 2 hours after pain medicine Comments User: Elam Dutch, RN Date/Time  Eilene Ghazi Time): 01/02/2021 7:41:05 PM above 94% pulse ox User: Elam Dutch, RN Date/Time Eilene Ghazi Time): 01/02/2021 7:41:49 PM congested sinuses User: Elam Dutch, RN Date/Time (Eastern Time): 01/02/2021 8:14:21 PM called pt back and advised-on call provider stated no further action needed for now, to continue home care advise regarding sinus congestion.caller verbalized understanding. PLEASE NOTE: All timestamps contained within this report are represented as [place] Standard Time. CONFIDENTIALTY NOTICE: This fax transmission is intended only for the addressee. It contains information that is legally privileged, confidential or otherwise protected from use or disclosure. If you are not the intended recipient, you are strictly prohibited from reviewing, disclosing, copying using or disseminating any of this information or taking any action in reliance on or regarding this information. If you have received this fax in error, please notify us immediately by telephone so that we can arrange for its return to [place]. Phone: [phone], Toll-Free: [phone], Fax: [phone] Page: 3 of 3 Call Id: [id number] Paging DoctorName Phone DateTime Result/ Outcome Message Type Notes Scarlette Calico - MD [phone] 01/02/2021 7:48:08 PM Paged On Call Back to Call Center Doctor Paged Hi, this is esenia a nurse with [facility] calling in regards to a patient of Dr.Paz if you can call me back at [phone] thank you. Scarlette Calico - MD 01/02/2021 8:13:55 PM Spoke with On Call - General Message Result on call provider stated no further action needed for now, to continue home care advise regarding sinus congestion

## 2021-01-03 NOTE — Telephone Encounter (Signed)
Spoke w/ Pt- informed of recommendations. Pt verbalized understanding. Rx for Astelin sent to [facility].

## 2021-01-03 NOTE — Telephone Encounter (Signed)
Please advise 

## 2021-01-03 NOTE — Telephone Encounter (Signed)
Recommend  - over-the-counter Flonase 2 sprays on each side of the nose daily. - Also, send a prescription for Astelin 2 sprays on each side of the nose twice daily until better. #1 and 1 RF

## 2021-01-14 DIAGNOSIS — L738 Other specified follicular disorders: Secondary | ICD-10-CM | POA: Diagnosis not present

## 2021-01-14 DIAGNOSIS — L300 Nummular dermatitis: Secondary | ICD-10-CM | POA: Diagnosis not present

## 2021-01-14 DIAGNOSIS — L570 Actinic keratosis: Secondary | ICD-10-CM | POA: Diagnosis not present

## 2021-01-14 DIAGNOSIS — L821 Other seborrheic keratosis: Secondary | ICD-10-CM | POA: Diagnosis not present

## 2021-02-05 ENCOUNTER — Ambulatory Visit: Payer: Medicare PPO | Admitting: *Deleted

## 2021-02-14 ENCOUNTER — Other Ambulatory Visit: Payer: Self-pay

## 2021-02-14 ENCOUNTER — Ambulatory Visit (INDEPENDENT_AMBULATORY_CARE_PROVIDER_SITE_OTHER): Payer: Medicare PPO

## 2021-02-14 ENCOUNTER — Ambulatory Visit: Payer: Medicare PPO

## 2021-02-14 VITALS — BP 130/80 | HR 55 | Temp 97.9°F | Resp 16 | Ht 69.0 in | Wt 196.6 lb

## 2021-02-14 DIAGNOSIS — Z0000 Encounter for general adult medical examination without abnormal findings: Secondary | ICD-10-CM

## 2021-02-14 NOTE — Progress Notes (Signed)
Subjective:   Benjamin Adkins is a 80 y.o. male who presents for Medicare Annual/Subsequent preventive examination.  Review of Systems     Cardiac Risk Factors include: advanced age (>53mn, >>27women);male gender;hypertension;dyslipidemia     Objective:    Today's Vitals   02/14/21 1142  BP: 130/80  Pulse: (!) 55  Resp: 16  Temp: 97.9 F (36.6 C)  TempSrc: Temporal  SpO2: 97%  Weight: 196 lb 9.6 oz (89.2 kg)  Height: '5\' 9"'$  (1.753 m)   Body mass index is 29.03 kg/m.  Advanced Directives 02/14/2021 02/02/2020 11/25/2018 11/19/2017 11/18/2016 02/27/2016 08/19/2011  Does Patient Have a Medical Advance Directive? Yes Yes Yes Yes Yes Yes Patient has advance directive, copy not in chart  Type of Advance Directive [place] will [place] will Living will;Healthcare Power of [place]  Does patient want to make changes to medical advance directive? - No - Patient declined No - Patient declined - - - -  Copy of [place] Chart? No - copy requested No - copy requested Yes - validated most recent copy scanned in chart (See row information) No - copy requested No - copy requested No - copy requested -    Current Medications (verified) Outpatient Encounter Medications as of 02/14/2021  Medication Sig   aspirin 325 MG EC tablet Take 325 mg by mouth daily.   atorvastatin (LIPITOR) 40 MG tablet TAKE 1 TABLET BY MOUTH EVERY DAY   azelastine (ASTELIN) 0.1 % nasal spray Place 2 sprays into both nostrils 2 (two) times daily. Use in each nostril as directed   celecoxib (CELEBREX) 100 MG capsule Take 1 capsule (100 mg total) by mouth 2 (two) times daily as needed for moderate pain.   ezetimibe (ZETIA) 10 MG tablet Take 1 tablet (10 mg total) by mouth daily.   metoprolol succinate (TOPROL-XL) 25 MG 24 hr tablet TAKE  1 TABLET BY MOUTH EVERY DAY   Multiple Vitamin (MULTIVITAMIN) capsule Take 1 capsule by mouth daily.   omeprazole (PRILOSEC) 20 MG capsule Take 1 capsule (20 mg total) by mouth daily before breakfast.   amoxicillin (AMOXIL) 500 MG capsule TAKE 4 CAPSULES BY MOUTH AS NEEDED FOR DENTAL PROCEDURE (Patient not taking: No sig reported)   nitroGLYCERIN (NITROSTAT) 0.4 MG SL tablet Place 1 tablet (0.4 mg total) under the tongue every 5 (five) minutes as needed. (Patient not taking: No sig reported)   No facility-administered encounter medications on file as of 02/14/2021.    Allergies (verified) Patient has no known allergies.   History: Past Medical History:  Diagnosis Date   Coronary atherosclerosis of unspecified type of vessel, native or graft    MI 2006, h/o CABG   DJD (degenerative joint disease)    Elevated prostate specific antigen (PSA)    Elevated PSA    Esophageal reflux    HTN (hypertension)    Hyperlipidemia    nmr lipoprofile 2004: LDL 154 (1653/711), HDL 42, TG 80.NMR  LDL =,125, but PMH  of MI LDL goal=,70   Hypertension    Myocardial infarction ([facility]    2006   Other cataract    Personal history of colonic polyps    Shortness of breath    DOE   Sleep apnea    uses CPAP   Past Surgical History:  Procedure Laterality Date   CARDIAC CATHETERIZATION  2006   CATARACT EXTRACTION Bilateral    COLONOSCOPY W/ POLYPECTOMY     CORONARY ARTERY BYPASS GRAFT  2006   X 4    HERNIA REPAIR     VENTRAL, LEFT & RIGHT GROIN   INGUINAL HERNIA REPAIR     TOTAL KNEE ARTHROPLASTY  2005   TOTAL KNEE ARTHROPLASTY  2006   TOTAL KNEE ARTHROPLASTY  08/25/2011   Procedure: TOTAL KNEE ARTHROPLASTY;  Surgeon: Lorn Junes, MD;  Location: [place];  Service: Orthopedics;  Laterality: Right;  DR Ephriam Knuckles 7 MINUTES FOR SURGERY   Family History  Problem Relation Age of Onset   Cancer Father        stomach   Stomach cancer Father    Hypertension Mother    Breast cancer Mother     Coronary artery disease Mother    Heart disease Mother    Breast cancer Sister    Crohn's disease Sister    Arthritis Sister    Arthritis Brother    Heart attack Paternal Grandfather 42   Colon cancer Neg Hx    Prostate cancer Neg Hx    Social History   Socioeconomic History   Marital status: Widowed    Spouse name: Not on file   Number of children: 1   Years of education: Not on file   Highest education level: Not on file  Occupational History   Occupation: retired  Tobacco Use   Smoking status: Former    Packs/day: 0.25    Years: 3.00    Pack years: 0.75    Types: Cigarettes    Start date: 04/12/1959    Quit date: 08/12/1961    Years since quitting: 59.5   Smokeless tobacco: Never   Tobacco comments:    during college  Vaping Use   Vaping Use: Never used  Substance and Sexual Activity   Alcohol use: Yes    Alcohol/week: 7.0 standard drinks    Types: 4 Glasses of wine, 3 Cans of beer per week    Comment: Social   Drug use: No   Sexual activity: Yes  Other Topics Concern   Not on file  Social History Narrative   Lives alone. Has a daughter in [place] , [place] G-son   Daughter: Tamera Reason [id number]   Social Determinants of Health   Financial Resource Strain: Low Risk    Difficulty of Paying Living Expenses: Not hard at all  Food Insecurity: No Food Insecurity   Worried About Charity fundraiser in the Last Year: Never true   [place] in the Last Year: Never true  Transportation Needs: No Transportation Needs   Lack of Transportation (Medical): No   Lack of Transportation (Non-Medical): No  Physical Activity: Insufficiently Active   Days of Exercise per Week: 3 days   Minutes of Exercise per Session: 40 min  Stress: No Stress Concern Present   Feeling of Stress : Not at all  Social Connections: Moderately Integrated   Frequency of Communication with Friends and Family: More than three times a week   Frequency of Social Gatherings with Friends and  Family: More than three times a week   Attends Religious Services: More than 4 times per year   Active Member of [employer] or Organizations: Yes   Attends Archivist Meetings: More than 4 times per year   Marital Status: Widowed    Tobacco Counseling Counseling given: Not Answered Tobacco comments: during college   Clinical Intake:  Pre-visit preparation completed: Yes  Pain : No/denies pain     Nutritional Status: BMI 25 -29 Overweight Nutritional Risks: None Diabetes: No  How often do you need to have someone help you when you read instructions, pamphlets, or other written materials from your doctor or pharmacy?: 1 - Never  Diabetic?No  Interpreter Needed?: No  Information entered by :: Caroleen Hamman LPN   Activities of Daily Living In your present state of health, do you have any difficulty performing the following activities: 02/14/2021  Hearing? N  Vision? N  Difficulty concentrating or making decisions? N  Walking or climbing stairs? N  Dressing or bathing? N  Doing errands, shopping? N  Preparing Food and eating ? N  Using the Toilet? N  In the past six months, have you accidently leaked urine? N  Do you have problems with loss of bowel control? N  Managing your Medications? N  Managing your Finances? N  Housekeeping or managing your Housekeeping? N  Some recent data might be hidden    Patient Care Team: Colon Branch, MD as PCP - General (Internal Medicine) Josue Hector, MD as PCP - Cardiology (Cardiology) Marin Olp Rudell Cobb, MD as Consulting Physician (Oncology) Nickie Retort, MD (Inactive) as Consulting Physician (Urology)  Indicate any recent Medical Services you may have received from other than [facility] providers in the past year (date may be approximate).     Assessment:   This is a routine wellness examination for Abdula.  Hearing/Vision screen Hearing Screening - Comments:: No issues Vision Screening - Comments:: Wears  glasses Last eye exam-2021-[facility]  Dietary issues and exercise activities discussed: Current Exercise Habits: Home exercise routine, Type of exercise: Other - see comments;strength training/weights (golf), Time (Minutes): 45, Frequency (Times/Week): 3, Weekly Exercise (Minutes/Week): 135, Intensity: Mild, Exercise limited by: None identified   Goals Addressed   None    Depression Screen PHQ 2/9 Scores 02/14/2021 08/14/2020 11/25/2018 01/14/2018 11/18/2016 05/06/2016 12/26/2015  PHQ - 2 Score 0 0 0 0 0 0 0    Fall Risk Fall Risk  02/14/2021 08/14/2020 04/05/2019 11/25/2018 01/14/2018  Falls in the past year? 0 0 0 0 No  Number falls in past yr: 0 0 - - -  Injury with Fall? 0 0 - - -  Follow up Falls prevention discussed - Falls evaluation completed - -    FALL RISK PREVENTION PERTAINING TO THE HOME:  Any stairs in or around the home? Yes  If so, are there any without handrails? No  Home free of loose throw rugs in walkways, pet beds, electrical cords, etc? Yes  Adequate lighting in your home to reduce risk of falls? Yes   ASSISTIVE DEVICES UTILIZED TO PREVENT FALLS:  Life alert? No  Use of a cane, walker or w/c? No  Grab bars in the bathroom? No  Shower chair or bench in shower? No  Elevated toilet seat or a handicapped toilet? No   TIMED UP AND GO:  Was the test performed? Yes .  Length of time to ambulate 10 feet: 10 sec.   Gait steady and fast without use of assistive device  Cognitive Function:Normal cognitive status assessed by direct observation by this Nurse Health Advisor. No abnormalities found.          Immunizations Immunization History  Administered Date(s) Administered   Fluad Quad(high Dose 65+) 03/17/2019   Influenza Split 05/27/2011, 04/02/2012   Influenza Whole 05/04/2007, 05/18/2008, 05/22/2009, 05/16/2010   Influenza, High Dose Seasonal  PF 04/28/2013, 04/23/2015, 04/10/2016   Influenza,inj,Quad PF,6+ Mos 08/10/2014   Influenza-Unspecified 04/28/2017,  04/29/2018, 04/20/2020   Moderna Sars-Covid-2 Vaccination 08/01/2019, 08/29/2019, 05/17/2020   Pneumococcal Conjugate-13 04/23/2015   Pneumococcal Polysaccharide-23 08/09/2013   Tetanus 08/23/2013   Zoster Recombinat (Shingrix) 04/16/2019, 10/01/2019   Zoster, Live 07/02/2011    TDAP status: Up to date  Flu Vaccine status: Up to date  Pneumococcal vaccine status: Up to date  Covid-19 vaccine status: Completed vaccines  Qualifies for Shingles Vaccine? No   Zostavax completed Yes   Shingrix Completed?: Yes  Screening Tests Health Maintenance  Topic Date Due   COVID-19 Vaccine (4 - Booster for Moderna series) 09/17/2020   INFLUENZA VACCINE  02/18/2021   TETANUS/TDAP  08/24/2023   Hepatitis C Screening  Completed   PNA vac Low Risk Adult  Completed   Zoster Vaccines- Shingrix  Completed   HPV VACCINES  Aged Out    Health Maintenance  Health Maintenance Due  Topic Date Due   COVID-19 Vaccine (4 - Booster for Moderna series) 09/17/2020    Colorectal cancer screening: No longer required.   Lung Cancer Screening: (Low Dose CT Chest recommended if Age 32-80 years, 30 pack-year currently smoking OR have quit w/in 15years.) does not qualify.     Additional Screening:  Hepatitis C Screening: does not qualify  Vision Screening: Recommended annual ophthalmology exams for early detection of glaucoma and other disorders of the eye. Is the patient up to date with their annual eye exam?  Yes  Who is the provider or what is the name of the office in which the patient attends annual eye exams? [facility]   Dental Screening: Recommended annual dental exams for proper oral hygiene  Community Resource Referral / Chronic Care Management: CRR required this visit?  No   CCM required this visit?  No      Plan:     I have personally reviewed and noted the following in the patient's chart:   Medical and social history Use of alcohol, tobacco or illicit drugs  Current  medications and supplements including opioid prescriptions. Patient is not currently taking opioid prescriptions. Functional ability and status Nutritional status Physical activity Advanced directives List of other physicians Hospitalizations, surgeries, and ER visits in previous 12 months Vitals Screenings to include cognitive, depression, and falls Referrals and appointments  In addition, I have reviewed and discussed with patient certain preventive protocols, quality metrics, and best practice recommendations. A written personalized care plan for preventive services as well as general preventive health recommendations were provided to patient.   Patient to access avs on mychart.  Marta Antu, LPN   [id number]  Nurse Health Advisor  Nurse Notes: None

## 2021-02-14 NOTE — Patient Instructions (Signed)
Mr. Benjamin Adkins , Thank you for taking time to come for your Medicare Wellness Visit. I appreciate your ongoing commitment to your health goals. Please review the following plan we discussed and let me know if I can assist you in the future.   Screening recommendations/referrals: Colonoscopy: No longer required Recommended yearly ophthalmology/optometry visit for glaucoma screening and checkup Recommended yearly dental visit for hygiene and checkup  Vaccinations: Influenza vaccine: Up to date Pneumococcal vaccine: Up to date Tdap vaccine: Up to date-Due 08/24/2023 Shingles vaccine: Completed vaccines   Covid-19: Up to date  Advanced directives: Please bring a copy for your chart  Conditions/risks identified: See problem list  Next appointment: Follow up in one year for your annual wellness visit. 02/20/2022 @ 10:20  Preventive Care 80 Years and Older, Male Preventive care refers to lifestyle choices and visits with your health care provider that can promote health and wellness. What does preventive care include? A yearly physical exam. This is also called an annual well check. Dental exams once or twice a year. Routine eye exams. Ask your health care provider how often you should have your eyes checked. Personal lifestyle choices, including: Daily care of your teeth and gums. Regular physical activity. Eating a healthy diet. Avoiding tobacco and drug use. Limiting alcohol use. Practicing safe sex. Taking low doses of aspirin every day. Taking vitamin and mineral supplements as recommended by your health care provider. What happens during an annual well check? The services and screenings done by your health care provider during your annual well check will depend on your age, overall health, lifestyle risk factors, and family history of disease. Counseling  Your health care provider may ask you questions about your: Alcohol use. Tobacco use. Drug use. Emotional well-being. Home and  relationship well-being. Sexual activity. Eating habits. History of falls. Memory and ability to understand (cognition). Work and work Statistician. Screening  You may have the following tests or measurements: Height, weight, and BMI. Blood pressure. Lipid and cholesterol levels. These may be checked every 5 years, or more frequently if you are over 42 years old. Skin check. Lung cancer screening. You may have this screening every year starting at age 54 if you have a 30-pack-year history of smoking and currently smoke or have quit within the past 15 years. Fecal occult blood test (FOBT) of the stool. You may have this test every year starting at age 74. Flexible sigmoidoscopy or colonoscopy. You may have a sigmoidoscopy every 5 years or a colonoscopy every 10 years starting at age 42. Prostate cancer screening. Recommendations will vary depending on your family history and other risks. Hepatitis C blood test. Hepatitis B blood test. Sexually transmitted disease (STD) testing. Diabetes screening. This is done by checking your blood sugar (glucose) after you have not eaten for a while (fasting). You may have this done every 1-3 years. Abdominal aortic aneurysm (AAA) screening. You may need this if you are a current or former smoker. Osteoporosis. You may be screened starting at age 71 if you are at high risk. Talk with your health care provider about your test results, treatment options, and if necessary, the need for more tests. Vaccines  Your health care provider may recommend certain vaccines, such as: Influenza vaccine. This is recommended every year. Tetanus, diphtheria, and acellular pertussis (Tdap, Td) vaccine. You may need a Td booster every 10 years. Zoster vaccine. You may need this after age 21. Pneumococcal 13-valent conjugate (PCV13) vaccine. One dose is recommended after age 69. Pneumococcal  polysaccharide (PPSV23) vaccine. One dose is recommended after age 71. Talk to your  health care provider about which screenings and vaccines you need and how often you need them. This information is not intended to replace advice given to you by your health care provider. Make sure you discuss any questions you have with your health care provider. Document Released: 08/03/2015 Document Revised: 03/26/2016 Document Reviewed: 05/08/2015 Elsevier Interactive Patient Education  2017 [employer] Prevention in the Home Falls can cause injuries. They can happen to people of all ages. There are many things you can do to make your home safe and to help prevent falls. What can I do on the outside of my home? Regularly fix the edges of walkways and driveways and fix any cracks. Remove anything that might make you trip as you walk through a door, such as a raised step or threshold. Trim any bushes or trees on the path to your home. Use bright outdoor lighting. Clear any walking paths of anything that might make someone trip, such as rocks or tools. Regularly check to see if handrails are loose or broken. Make sure that both sides of any steps have handrails. Any raised decks and porches should have guardrails on the edges. Have any leaves, snow, or ice cleared regularly. Use sand or salt on walking paths during winter. Clean up any spills in your garage right away. This includes oil or grease spills. What can I do in the bathroom? Use night lights. Install grab bars by the toilet and in the tub and shower. Do not use towel bars as grab bars. Use non-skid mats or decals in the tub or shower. If you need to sit down in the shower, use a plastic, non-slip stool. Keep the floor dry. Clean up any water that spills on the floor as soon as it happens. Remove soap buildup in the tub or shower regularly. Attach bath mats securely with double-sided non-slip rug tape. Do not have throw rugs and other things on the floor that can make you trip. What can I do in the bedroom? Use night  lights. Make sure that you have a light by your bed that is easy to reach. Do not use any sheets or blankets that are too big for your bed. They should not hang down onto the floor. Have a firm chair that has side arms. You can use this for support while you get dressed. Do not have throw rugs and other things on the floor that can make you trip. What can I do in the kitchen? Clean up any spills right away. Avoid walking on wet floors. Keep items that you use a lot in easy-to-reach places. If you need to reach something above you, use a strong step stool that has a grab bar. Keep electrical cords out of the way. Do not use floor polish or wax that makes floors slippery. If you must use wax, use non-skid floor wax. Do not have throw rugs and other things on the floor that can make you trip. What can I do with my stairs? Do not leave any items on the stairs. Make sure that there are handrails on both sides of the stairs and use them. Fix handrails that are broken or loose. Make sure that handrails are as long as the stairways. Check any carpeting to make sure that it is firmly attached to the stairs. Fix any carpet that is loose or worn. Avoid having throw rugs at the top  or bottom of the stairs. If you do have throw rugs, attach them to the floor with carpet tape. Make sure that you have a light switch at the top of the stairs and the bottom of the stairs. If you do not have them, ask someone to add them for you. What else can I do to help prevent falls? Wear shoes that: Do not have high heels. Have rubber bottoms. Are comfortable and fit you well. Are closed at the toe. Do not wear sandals. If you use a stepladder: Make sure that it is fully opened. Do not climb a closed stepladder. Make sure that both sides of the stepladder are locked into place. Ask someone to hold it for you, if possible. Clearly mark and make sure that you can see: Any grab bars or handrails. First and last  steps. Where the edge of each step is. Use tools that help you move around (mobility aids) if they are needed. These include: Canes. Walkers. Scooters. Crutches. Turn on the lights when you go into a dark area. Replace any light bulbs as soon as they burn out. Set up your furniture so you have a clear path. Avoid moving your furniture around. If any of your floors are uneven, fix them. If there are any pets around you, be aware of where they are. Review your medicines with your doctor. Some medicines can make you feel dizzy. This can increase your chance of falling. Ask your doctor what other things that you can do to help prevent falls. This information is not intended to replace advice given to you by your health care provider. Make sure you discuss any questions you have with your health care provider. Document Released: 05/03/2009 Document Revised: 12/13/2015 Document Reviewed: 08/11/2014 Elsevier Interactive Patient Education  2017 [employer].

## 2021-05-09 ENCOUNTER — Telehealth: Payer: Self-pay | Admitting: Internal Medicine

## 2021-05-09 NOTE — Telephone Encounter (Signed)
Hives on legs 2-3 days started showing up.  Every itchy  Patient is wanting to know if we can send something in or does he need to be seen for this  They randomly popped up and he does know from what and just wants to double check if its something serious or not

## 2021-05-09 NOTE — Telephone Encounter (Signed)
Patient said that was fine but since we do not have any available appontments this week he would like someone to call him so he can explain it and maybe send pics in for his comfort & for [place] to see them   If he still needs to be seen after that  he said he would make an appt

## 2021-05-09 NOTE — Telephone Encounter (Signed)
Needs to be seen please

## 2021-05-10 DIAGNOSIS — H2513 Age-related nuclear cataract, bilateral: Secondary | ICD-10-CM | POA: Diagnosis not present

## 2021-05-14 DIAGNOSIS — L738 Other specified follicular disorders: Secondary | ICD-10-CM | POA: Diagnosis not present

## 2021-05-14 DIAGNOSIS — L570 Actinic keratosis: Secondary | ICD-10-CM | POA: Diagnosis not present

## 2021-05-14 DIAGNOSIS — D485 Neoplasm of uncertain behavior of skin: Secondary | ICD-10-CM | POA: Diagnosis not present

## 2021-05-14 DIAGNOSIS — L578 Other skin changes due to chronic exposure to nonionizing radiation: Secondary | ICD-10-CM | POA: Diagnosis not present

## 2021-05-14 DIAGNOSIS — L821 Other seborrheic keratosis: Secondary | ICD-10-CM | POA: Diagnosis not present

## 2021-05-14 DIAGNOSIS — C44219 Basal cell carcinoma of skin of left ear and external auricular canal: Secondary | ICD-10-CM | POA: Diagnosis not present

## 2021-05-14 DIAGNOSIS — Z85828 Personal history of other malignant neoplasm of skin: Secondary | ICD-10-CM | POA: Diagnosis not present

## 2021-05-14 DIAGNOSIS — D2371 Other benign neoplasm of skin of right lower limb, including hip: Secondary | ICD-10-CM | POA: Diagnosis not present

## 2021-05-27 ENCOUNTER — Other Ambulatory Visit: Payer: Self-pay | Admitting: Internal Medicine

## 2021-07-19 ENCOUNTER — Other Ambulatory Visit: Payer: Self-pay | Admitting: Internal Medicine

## 2021-08-15 ENCOUNTER — Ambulatory Visit (INDEPENDENT_AMBULATORY_CARE_PROVIDER_SITE_OTHER): Payer: Medicare PPO | Admitting: Internal Medicine

## 2021-08-15 ENCOUNTER — Encounter: Payer: Self-pay | Admitting: Internal Medicine

## 2021-08-15 VITALS — BP 120/74 | HR 59 | Temp 98.2°F | Resp 16 | Ht 69.0 in | Wt 203.2 lb

## 2021-08-15 DIAGNOSIS — I10 Essential (primary) hypertension: Secondary | ICD-10-CM

## 2021-08-15 DIAGNOSIS — E785 Hyperlipidemia, unspecified: Secondary | ICD-10-CM | POA: Diagnosis not present

## 2021-08-15 DIAGNOSIS — R739 Hyperglycemia, unspecified: Secondary | ICD-10-CM

## 2021-08-15 DIAGNOSIS — L509 Urticaria, unspecified: Secondary | ICD-10-CM

## 2021-08-15 DIAGNOSIS — Z23 Encounter for immunization: Secondary | ICD-10-CM | POA: Diagnosis not present

## 2021-08-15 DIAGNOSIS — Z0001 Encounter for general adult medical examination with abnormal findings: Secondary | ICD-10-CM

## 2021-08-15 DIAGNOSIS — G4733 Obstructive sleep apnea (adult) (pediatric): Secondary | ICD-10-CM

## 2021-08-15 DIAGNOSIS — Z0000 Encounter for general adult medical examination without abnormal findings: Secondary | ICD-10-CM

## 2021-08-15 LAB — COMPREHENSIVE METABOLIC PANEL
ALT: 24 U/L (ref 0–53)
AST: 25 U/L (ref 0–37)
Albumin: 4.3 g/dL (ref 3.5–5.2)
Alkaline Phosphatase: 75 U/L (ref 39–117)
BUN: 16 mg/dL (ref 6–23)
CO2: 26 mEq/L (ref 19–32)
Calcium: 9.4 mg/dL (ref 8.4–10.5)
Chloride: 104 mEq/L (ref 96–112)
Creatinine, Ser: 0.96 mg/dL (ref 0.40–1.50)
GFR: 74.77 mL/min (ref >60.00)
Glucose, Bld: 82 mg/dL (ref 70–99)
Potassium: 4.4 mEq/L (ref 3.5–5.1)
Sodium: 137 mEq/L (ref 135–145)
Total Bilirubin: 0.8 mg/dL (ref 0.2–1.2)
Total Protein: 6.7 g/dL (ref 6.0–8.3)

## 2021-08-15 LAB — CBC WITH DIFFERENTIAL/PLATELET
Basophils Absolute: 0 10*3/uL (ref 0.0–0.1)
Basophils Relative: 0.4 % (ref 0.0–3.0)
Eosinophils Absolute: 0.1 10*3/uL (ref 0.0–0.7)
Eosinophils Relative: 1.2 % (ref 0.0–5.0)
HCT: 43.7 % (ref 39.0–52.0)
Hemoglobin: 14.8 g/dL (ref 13.0–17.0)
Lymphocytes Relative: 21.2 % (ref 12.0–46.0)
Lymphs Abs: 1.7 10*3/uL (ref 0.7–4.0)
MCHC: 33.9 g/dL (ref 30.0–36.0)
MCV: 91.1 fl (ref 78.0–100.0)
Monocytes Absolute: 0.7 10*3/uL (ref 0.1–1.0)
Monocytes Relative: 8.5 % (ref 3.0–12.0)
Neutro Abs: 5.3 10*3/uL (ref 1.4–7.7)
Neutrophils Relative %: 68.7 % (ref 43.0–77.0)
Platelets: 160 10*3/uL (ref 150.0–400.0)
RBC: 4.8 Mil/uL (ref 4.22–5.81)
RDW: 13.8 % (ref 11.5–15.5)
WBC: 7.8 10*3/uL (ref 4.0–10.5)

## 2021-08-15 LAB — HEMOGLOBIN A1C: Hgb A1c MFr Bld: 5.5 % (ref 4.6–6.5)

## 2021-08-15 LAB — LIPID PANEL
Cholesterol: 125 mg/dL (ref 0–200)
HDL: 47.5 mg/dL (ref >39.00)
LDL Cholesterol: 52 mg/dL (ref 0–99)
NonHDL: 77.25
Total CHOL/HDL Ratio: 3
Triglycerides: 125 mg/dL (ref 0.0–149.0)
VLDL: 25 mg/dL (ref 0.0–40.0)

## 2021-08-15 LAB — PSA: PSA: 4.09 ng/mL — ABNORMAL HIGH (ref 0.10–4.00)

## 2021-08-15 NOTE — Progress Notes (Signed)
Subjective:    Patient ID: Benjamin Adkins, male    DOB: [birth year], 81 y.o.   MRN: [id number]  DOS:  08/15/2021 Type of visit - description: CPX Here for CPX. We also discussed all his chronic medical problems. Also has developed whelps on and off over the last year. Typical trigger is pressure to his skin or sweats. Denies any lip or tongue swelling.  No cough or wheezing.   Review of Systems  Other than above, a 14 point review of systems is negative      Past Medical History:  Diagnosis Date   Coronary atherosclerosis of unspecified type of vessel, native or graft    MI 2006, h/o CABG   DJD (degenerative joint disease)    Elevated prostate specific antigen (PSA)    Elevated PSA    Esophageal reflux    HTN (hypertension)    Hyperlipidemia    nmr lipoprofile 2004: LDL 154 (1653/711), HDL 42, TG 80.NMR  LDL =,125, but PMH  of MI LDL goal=,70   Hypertension    Myocardial infarction [facility])    2006   Other cataract    Personal history of colonic polyps    Shortness of breath    DOE   Sleep apnea    uses CPAP    Past Surgical History:  Procedure Laterality Date   CARDIAC CATHETERIZATION     2006   CATARACT EXTRACTION Bilateral    COLONOSCOPY W/ POLYPECTOMY     CORONARY ARTERY BYPASS GRAFT  2006   X 4    HERNIA REPAIR     VENTRAL, LEFT & RIGHT GROIN   INGUINAL HERNIA REPAIR     TOTAL KNEE ARTHROPLASTY  2005   TOTAL KNEE ARTHROPLASTY  2006   TOTAL KNEE ARTHROPLASTY  08/25/2011   Procedure: TOTAL KNEE ARTHROPLASTY;  Surgeon: Lorn Junes, MD;  Location: [facility];  Service: Orthopedics;  Laterality: Right;  DR Ephriam Knuckles 90 MINUTES FOR SURGERY   Social History   Socioeconomic History   Marital status: Widowed    Spouse name: Not on file   Number of children: 1   Years of education: Not on file   Highest education level: Not on file  Occupational History   Occupation: retired  Tobacco Use   Smoking status: Former    Packs/day: 0.25    Years: 3.00    Pack  years: 0.75    Types: Cigarettes    Start date: 04/12/1959    Quit date: 08/12/1961    Years since quitting: 60.0   Smokeless tobacco: Never   Tobacco comments:    during college  Vaping Use   Vaping Use: Never used  Substance and Sexual Activity   Alcohol use: Yes    Alcohol/week: 7.0 standard drinks    Types: 4 Glasses of wine, 3 Cans of beer per week    Comment: Social   Drug use: No   Sexual activity: Yes  Other Topics Concern   Not on file  Social History Narrative   Lives alone.    Has a daughter in [place] , [place] G-son   Daughter: Tamera Reason [phone]   Social Determinants of Health   Financial Resource Strain: Low Risk    Difficulty of Paying Living Expenses: Not hard at all  Food Insecurity: No Food Insecurity   Worried About Charity fundraiser in the Last Year: Never true   [place] in the Last Year: Never true  Transportation Needs:  No Transportation Needs   Lack of Transportation (Medical): No   Lack of Transportation (Non-Medical): No  Physical Activity: Insufficiently Active   Days of Exercise per Week: 3 days   Minutes of Exercise per Session: 40 min  Stress: No Stress Concern Present   Feeling of Stress : Not at all  Social Connections: Moderately Integrated   Frequency of Communication with Friends and Family: More than three times a week   Frequency of Social Gatherings with Friends and Family: More than three times a week   Attends Religious Services: More than 4 times per year   Active Member of [employer] or Organizations: Yes   Attends Archivist Meetings: More than 4 times per year   Marital Status: Widowed  Human resources officer Violence: Not At Risk   Fear of Current or Ex-Partner: No   Emotionally Abused: No   Physically Abused: No   Sexually Abused: No    Current Outpatient Medications  Medication Instructions   amoxicillin (AMOXIL) 500 MG capsule TAKE 4 CAPSULES BY MOUTH AS NEEDED FOR DENTAL PROCEDURE   aspirin 325 mg, Oral,  Daily   atorvastatin (LIPITOR) 40 MG tablet TAKE 1 TABLET BY MOUTH EVERY DAY   azelastine (ASTELIN) 0.1 % nasal spray 2 sprays, Each Nare, 2 times daily, Use in each nostril as directed   celecoxib (CELEBREX) 100 mg, Oral, 2 times daily PRN   ezetimibe (ZETIA) 10 MG tablet TAKE 1 TABLET BY MOUTH EVERY DAY   metoprolol succinate (TOPROL-XL) 25 MG 24 hr tablet TAKE 1 TABLET BY MOUTH EVERY DAY   Multiple Vitamin (MULTIVITAMIN) capsule 1 capsule, Oral, Daily,     nitroGLYCERIN (NITROSTAT) 0.4 mg, Sublingual, Every 5 min PRN   omeprazole (PRILOSEC) 20 MG capsule TAKE 1 CAPSULE BY MOUTH DAILY BEFORE BREAKFAST.       Objective:   Physical Exam BP 120/74 (BP Location: Left Arm, Patient Position: Sitting, Cuff Size: Small)    Pulse (!) 59    Temp 98.2 F (36.8 C) (Oral)    Resp 16    Ht 5\' 9"  (1.753 m)    Wt 203 lb 4 oz (92.2 kg)    SpO2 96%    BMI 30.01 kg/m  General: Well developed, NAD, BMI noted Neck: No  thyromegaly  HEENT:  Normocephalic . Face symmetric, atraumatic Lungs:  CTA B Normal respiratory effort, no intercostal retractions, no accessory muscle use. Heart: RRR,  no murmur.  Abdomen:  Not distended, soft, non-tender. No rebound or rigidity.   Lower extremities: no pretibial edema bilaterally DRE: Normal sphincter tone, no stools, prostate symmetric, slightly enlarged, no nodule Skin: Exposed areas without rash. Not pale. Not jaundice Neurologic:  alert & oriented X3.  Speech normal, gait appropriate for age and unassisted Strength symmetric and appropriate for age.  Psych: Cognition and judgment appear intact.  Cooperative with normal attention span and concentration.  Behavior appropriate. No anxious or depressed appearing.     Assessment    Assessment  Prediabetes HTN Hyperlipidemia CAD, MI 2006, CABG, RBBB OSA , on CPAP DJD Knee replacement-- on abx pre-dental procedures and: BP today is very good, continue Elevated PSA Melanoma : L ear, removed   2022  PLAN: Here for CPX Prediabetes: A1c HTN: BP today is very good, recommend ambulatory BPs, continue metoprolol. Hyperlipidemia, continue atorvastatin and Zetia.  Last LDL very good.  Recheck. CAD: Asymptomatic. OSA: A new CPAP will be sent to the patient, refer to pulmonary for set up (has not seen pulmonary  in years) Elevated PSA: See comments under CPX Pressure urticaria: Having whelps triggered by pressure.  On Zyrtec.  I answered most of his questions, offered allergist referral, he will call if interested.  As long as symptoms are not escalating and they are mild recommend to simply stay on Zyrtec. RTC 1 year   -Tdap: 08/2103 - PNA 23: 07/2013 ; prevnar:04-2015; PNM 20:   today - s/p  zostavax:06/2011; Shingrix x2 -  covid  vax: UTD - Had a Flu shot  -CCS:  cscope 02/2006 Diverticulosis cscope 04-2016- 1 polyp, next due 04/2021, no mandatory follow-up due to age per GI letter; pros and cons discussed, he will think about it. -prostate ca screening/elevated PSA:  saw urology 08/2019, DRE was wnl.  Patient has not seen them again. PSA  2019: 5.4,  08-2019: 4.08; 07-2020: 3.78 Plan: DRE today normal, recheck PSA.  Consider no further PSAs after today if stable -labs : CMP, FLP,CBC, PSA -Diet and exercise: Discussed - ACP information provided.     This visit occurred during the SARS-CoV-2 public health emergency.  Safety protocols were in place, including screening questions prior to the visit, additional usage of staff PPE, and extensive cleaning of exam room while observing appropriate contact time as indicated for disinfecting solutions.

## 2021-08-15 NOTE — Assessment & Plan Note (Signed)
Here for CPX Prediabetes: A1c HTN: BP today is very good, recommend ambulatory BPs, continue metoprolol. Hyperlipidemia, continue atorvastatin and Zetia.  Last LDL very good.  Recheck. CAD: Asymptomatic. OSA: A new CPAP will be sent to the patient, refer to pulmonary for set up (has not seen pulmonary in years) Elevated PSA: See comments under CPX Pressure urticaria: Having whelps triggered by pressure.  On Zyrtec.  I answered most of his questions, offered allergist referral, he will call if interested.  As long as symptoms are not escalating and they are mild recommend to simply stay on Zyrtec. RTC 1 year

## 2021-08-15 NOTE — Assessment & Plan Note (Signed)
-  Tdap: 08/2103 - PNA 23: 07/2013 ; prevnar:04-2015; PNM 20:   today - s/p  zostavax:06/2011; Shingrix x2 -  covid  vax: UTD - Had a Flu shot  -CCS:  cscope 02/2006 Diverticulosis cscope 04-2016- 1 polyp, next due 04/2021, no mandatory follow-up due to age per GI letter; pros and cons discussed, he will think about it. -prostate ca screening/elevated PSA:  saw urology 08/2019, DRE was wnl.  Patient has not seen them again. PSA  2019: 5.4,  08-2019: 4.08; 07-2020: 3.78 Plan: DRE today normal, recheck PSA.  Consider no further PSAs after today if stable -labs : CMP, FLP,CBC, PSA -Diet and exercise: Discussed - ACP information provided.

## 2021-08-15 NOTE — Patient Instructions (Signed)
We are referring you to a specialist for CPAP management  If the whelps worse please let me know  Please read information about advance care planning  GO TO THE LAB : Get the blood work     [place], [place] back for   a physical exam in 1 year.

## 2021-08-19 ENCOUNTER — Other Ambulatory Visit: Payer: Self-pay

## 2021-08-19 ENCOUNTER — Ambulatory Visit (INDEPENDENT_AMBULATORY_CARE_PROVIDER_SITE_OTHER): Payer: Medicare PPO | Admitting: Pulmonary Disease

## 2021-08-19 ENCOUNTER — Encounter: Payer: Self-pay | Admitting: Pulmonary Disease

## 2021-08-19 ENCOUNTER — Telehealth: Payer: Self-pay | Admitting: Pulmonary Disease

## 2021-08-19 VITALS — BP 122/78 | HR 60 | Temp 98.7°F | Ht 69.0 in | Wt 202.0 lb

## 2021-08-19 DIAGNOSIS — G4733 Obstructive sleep apnea (adult) (pediatric): Secondary | ICD-10-CM | POA: Diagnosis not present

## 2021-08-19 DIAGNOSIS — Z9989 Dependence on other enabling machines and devices: Secondary | ICD-10-CM

## 2021-08-19 NOTE — Patient Instructions (Signed)
History of obstructive sleep apnea -Compliant with CPAP nightly -Benefiting from CPAP  Need machine upgrade -I do not currently have the current machine settings -We will call call the medical supply company to see if they have any information about the settings -Will appreciate if you can check if the machine has any SD card-we may be able to get the machine information from the SD card  We need some way to verify what pressure settings you are on at present to be able to send a prescription with the right settings to the medical supply company  Since your study was many years ago-looking back in the computer it was in 2006, diagnosed with sleep apnea, we may need a home sleep study to reconfirm sleep apnea  Plan will be yearly follow-up once we get things figured out

## 2021-08-19 NOTE — Progress Notes (Addendum)
Benjamin Adkins    [id number]    Jan 30, 1941  Primary Care Physician:Paz, Alda Berthold, MD  Referring Physician: Colon Branch, MD [address] [place],  [place] [address]  Chief complaint:   History of obstructive sleep apnea  HPI:  History of obstructive sleep apnea, machine is displaying past warrantee life message  Not having any issues with the machine Uses CPAP nightly Benefiting from CPAP use Wakes up feeling like is at a good nights rest  Usually goes to bed between 10 and 11, wake up time about 7 AM, 0-1 awakening Usually falls asleep easily  Is retired History of hypertension, coronary artery disease, hypercholesterolemia-all well controlled Never smoker  Is active, goes to the gym twice a week and plays golf regularly  Outpatient Encounter Medications as of 08/19/2021  Medication Sig   amoxicillin (AMOXIL) 500 MG capsule TAKE 4 CAPSULES BY MOUTH AS NEEDED FOR DENTAL PROCEDURE (Patient not taking: Reported on 12/24/2020)   aspirin 325 MG EC tablet Take 325 mg by mouth daily.   atorvastatin (LIPITOR) 40 MG tablet TAKE 1 TABLET BY MOUTH EVERY DAY   azelastine (ASTELIN) 0.1 % nasal spray Place 2 sprays into both nostrils 2 (two) times daily. Use in each nostril as directed   celecoxib (CELEBREX) 100 MG capsule Take 1 capsule (100 mg total) by mouth 2 (two) times daily as needed for moderate pain.   ezetimibe (ZETIA) 10 MG tablet TAKE 1 TABLET BY MOUTH EVERY DAY   metoprolol succinate (TOPROL-XL) 25 MG 24 hr tablet TAKE 1 TABLET BY MOUTH EVERY DAY   Multiple Vitamin (MULTIVITAMIN) capsule Take 1 capsule by mouth daily.   nitroGLYCERIN (NITROSTAT) 0.4 MG SL tablet Place 1 tablet (0.4 mg total) under the tongue every 5 (five) minutes as needed. (Patient not taking: Reported on 12/24/2020)   omeprazole (PRILOSEC) 20 MG capsule TAKE 1 CAPSULE BY MOUTH DAILY BEFORE BREAKFAST.   No facility-administered encounter medications on file as of 08/19/2021.     Allergies as of 08/19/2021   (No Known Allergies)    Past Medical History:  Diagnosis Date   Coronary atherosclerosis of unspecified type of vessel, native or graft    MI 2006, h/o CABG   DJD (degenerative joint disease)    Elevated prostate specific antigen (PSA)    Elevated PSA    Esophageal reflux    HTN (hypertension)    Hyperlipidemia    nmr lipoprofile 2004: LDL 154 (1653/711), HDL 42, TG 80.NMR  LDL =,125, but PMH  of MI LDL goal=,70   Hypertension    Myocardial infarction [facility])    2006   Other cataract    Personal history of colonic polyps    Shortness of breath    DOE   Sleep apnea    uses CPAP    Past Surgical History:  Procedure Laterality Date   CARDIAC CATHETERIZATION     2006   CATARACT EXTRACTION Bilateral    COLONOSCOPY W/ POLYPECTOMY     CORONARY ARTERY BYPASS GRAFT  2006   X 4    HERNIA REPAIR     VENTRAL, LEFT & RIGHT GROIN   INGUINAL HERNIA REPAIR     TOTAL KNEE ARTHROPLASTY  2005   TOTAL KNEE ARTHROPLASTY  2006   TOTAL KNEE ARTHROPLASTY  08/25/2011   Procedure: TOTAL KNEE ARTHROPLASTY;  Surgeon: Lorn Junes, MD;  Location: [facility];  Service: Orthopedics;  Laterality: Right;  DR Noemi Chapel  WANTS 85 MINUTES FOR SURGERY    Family History  Problem Relation Age of Onset   Cancer Father        stomach   Stomach cancer Father    Hypertension Mother    Breast cancer Mother    Coronary artery disease Mother    Heart disease Mother    Breast cancer Sister    Crohn's disease Sister    Arthritis Sister    Arthritis Brother    Heart attack Paternal Grandfather 84   Colon cancer Neg Hx    Prostate cancer Neg Hx     Social History   Socioeconomic History   Marital status: Widowed    Spouse name: Not on file   Number of children: 1   Years of education: Not on file   Highest education level: Not on file  Occupational History   Occupation: retired  Tobacco Use   Smoking status: Former    Packs/day: 0.25    Years: 3.00    Pack years:  0.75    Types: Cigarettes    Start date: 04/12/1959    Quit date: 08/12/1961    Years since quitting: 60.0   Smokeless tobacco: Never   Tobacco comments:    during college  Vaping Use   Vaping Use: Never used  Substance and Sexual Activity   Alcohol use: Yes    Alcohol/week: 7.0 standard drinks    Types: 4 Glasses of wine, 3 Cans of beer per week    Comment: Social   Drug use: No   Sexual activity: Yes  Other Topics Concern   Not on file  Social History Narrative   Lives alone.    Has a daughter in [place] , [place] G-son   Daughter: Tamera Reason [phone]   Social Determinants of Health   Financial Resource Strain: Low Risk    Difficulty of Paying Living Expenses: Not hard at all  Food Insecurity: No Food Insecurity   Worried About Charity fundraiser in the Last Year: Never true   [place] in the Last Year: Never true  Transportation Needs: No Transportation Needs   Lack of Transportation (Medical): No   Lack of Transportation (Non-Medical): No  Physical Activity: Insufficiently Active   Days of Exercise per Week: 3 days   Minutes of Exercise per Session: 40 min  Stress: No Stress Concern Present   Feeling of Stress : Not at all  Social Connections: Moderately Integrated   Frequency of Communication with Friends and Family: More than three times a week   Frequency of Social Gatherings with Friends and Family: More than three times a week   Attends Religious Services: More than 4 times per year   Active Member of [employer] or Organizations: Yes   Attends Archivist Meetings: More than 4 times per year   Marital Status: Widowed  Human resources officer Violence: Not At Risk   Fear of Current or Ex-Partner: No   Emotionally Abused: No   Physically Abused: No   Sexually Abused: No    Review of Systems  Constitutional:  Positive for fatigue.  Respiratory:  Positive for apnea.   Psychiatric/Behavioral:  Positive for sleep disturbance.    There were no vitals  filed for this visit.   Physical Exam Constitutional:      Appearance: Normal appearance.  HENT:     Head: Normocephalic.     Mouth/Throat:     Mouth: Mucous membranes are moist.  Comments: Crowded oropharynx, Mallampati 3 Eyes:     Extraocular Movements: Extraocular movements intact.     Pupils: Pupils are equal, round, and reactive to light.  Cardiovascular:     Rate and Rhythm: Normal rate and regular rhythm.     Heart sounds: No murmur heard.   No friction rub.  Pulmonary:     Effort: No respiratory distress.     Breath sounds: No stridor. No wheezing or rhonchi.  Musculoskeletal:     Cervical back: No rigidity or tenderness.  Neurological:     Mental Status: He is alert.  Psychiatric:        Mood and Affect: Mood normal.   Results of the Epworth flowsheet 08/19/2021  Sitting and reading 0  Watching TV 0  Sitting, inactive in a public place (e.g. a theatre or a meeting) 0  As a passenger in a car for an hour without a break 0  Lying down to rest in the afternoon when circumstances permit 0  Sitting and talking to someone 0  Sitting quietly after a lunch without alcohol 0  In a car, while stopped for a few minutes in traffic 0  Total score 0     Data Reviewed: Past sleep study not available but documentation from previous visits revealed he is on CPAP of 13, study showed AHI of 66 with O2 nadir of 67%  Download from machine not available today  Assessment:  Obstructive sleep apnea  Compliant with CPAP use  Describes significant improvement in his symptoms since using CPAP and he uses CPAP nightly without fail Feels well on CPAP No daytime sleepiness  Plan/Recommendations: Send an order to [facility] for CPAP of 13 with heated humidification  Encouraged to continue using CPAP on a nightly basis  Encouraged to continue graded exercises as tolerated  Yearly follow-up will be appropriate   Sherrilyn Rist MD [facility] 08/19/2021,  1:36 PM  CC: Colon Branch, MD  Download from Mr. [phone] ST card reviewed showing 100% compliance with CPAP Average use of 6 hours 54 minutes Machine set at 10, he does have occasional significant leaks. AHI of 5.6

## 2021-08-21 ENCOUNTER — Telehealth: Payer: Self-pay | Admitting: Pulmonary Disease

## 2021-08-21 DIAGNOSIS — G4733 Obstructive sleep apnea (adult) (pediatric): Secondary | ICD-10-CM

## 2021-08-22 NOTE — Telephone Encounter (Signed)
Reviewed CPAP compliance and it is working well  No changes need made at present

## 2021-08-22 NOTE — Telephone Encounter (Signed)
Spoke with pt who is stating [facility] need settings and new order for replacement C-PAP. After review of last OV with Dr. Ander Slade order was placed for New CPAP with setting of heated humidity and was sent to [facility]. Called pt and informed pt order had been placed. Nothing further needed at this time.   Dr. Ander Slade most recent compliance is uploaded under pt's media tab for your review. Just FYI

## 2021-09-11 ENCOUNTER — Other Ambulatory Visit: Payer: Self-pay | Admitting: Cardiovascular Disease

## 2021-11-03 ENCOUNTER — Other Ambulatory Visit: Payer: Self-pay | Admitting: Cardiovascular Disease

## 2021-11-08 ENCOUNTER — Other Ambulatory Visit: Payer: Self-pay | Admitting: Cardiovascular Disease

## 2021-11-12 ENCOUNTER — Other Ambulatory Visit: Payer: Self-pay | Admitting: Cardiovascular Disease

## 2021-11-30 ENCOUNTER — Other Ambulatory Visit: Payer: Self-pay | Admitting: Cardiovascular Disease

## 2021-12-10 ENCOUNTER — Other Ambulatory Visit: Payer: Self-pay | Admitting: Cardiovascular Disease

## 2021-12-16 ENCOUNTER — Other Ambulatory Visit: Payer: Self-pay | Admitting: Cardiovascular Disease

## 2021-12-17 ENCOUNTER — Encounter: Payer: Self-pay | Admitting: Cardiovascular Disease

## 2021-12-17 MED ORDER — METOPROLOL SUCCINATE ER 25 MG PO TB24: 25.0000 mg | ORAL_TABLET | Freq: Every day | ORAL | 1 refills | Status: DC

## 2021-12-18 MED ORDER — METOPROLOL SUCCINATE ER 25 MG PO TB24
25.0000 mg | ORAL_TABLET | Freq: Every day | ORAL | 0 refills | Status: DC
Start: 2021-12-18 — End: 2022-04-09

## 2022-01-10 ENCOUNTER — Other Ambulatory Visit: Payer: Self-pay | Admitting: Internal Medicine

## 2022-01-24 ENCOUNTER — Telehealth: Payer: Self-pay | Admitting: Internal Medicine

## 2022-01-24 NOTE — Telephone Encounter (Signed)
Pt dropped off copy of Advance Directive for provider to see and have on pt's chart. Document put at front office tray  under providers name.

## 2022-01-27 NOTE — Telephone Encounter (Signed)
Received, placed in PCP red folder to be signed.

## 2022-01-27 NOTE — Telephone Encounter (Signed)
POA sent for scanning

## 2022-02-03 NOTE — Progress Notes (Signed)
Patient ID: Benjamin Adkins, male   DOB: 09-04-1940, 81 y.o.   MRN: [id number]     This is a 81 y.o.  white male patient f/U CAD  In 2005 he had a perioperative MI after left knee replacement. He required urgent stenting to the RCA and subsequent CABG. He did have a negative treadmill prior to his knee surgery in 2005. He has done well since that time. He has a history of a right bundle branch block.his lipids are controlled. He has no history of diabetes and does not smoke.   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  All other systems reviewed and negative  General: BP 122/78   Pulse 69   Ht '5\' 9"'$  (1.753 m)   Wt 195 lb (88.5 kg)   SpO2 97%   BMI 28.80 kg/m  Affect appropriate Healthy:  appears stated age 81: normal Neck supple with no adenopathy JVP normal no bruits no thyromegaly Lungs clear with no wheezing and good diaphragmatic motion Heart:  S1/S2 no murmur, no rub, gallop or click PMI normal Abdomen: benighn, BS positve, no tenderness, no AAA no bruit.  No HSM or HJR Distal pulses  intact with no bruits No edema Neuro non-focal Skin warm and dry No muscular weakness Post bilateral TKR;s     Current Outpatient Medications  Medication Sig Dispense Refill   amoxicillin (AMOXIL) 500 MG capsule TAKE 4 CAPSULES BY MOUTH AS NEEDED FOR DENTAL PROCEDURE 20 capsule 0   aspirin 325 MG EC tablet Take 325 mg by mouth daily.     atorvastatin (LIPITOR) 40 MG tablet TAKE 1 TABLET BY MOUTH DAILY. PLEASE KEEP UPCOMING CARDIOLOGY APPOINTMENT FOR FURTHER REFILLS 90 tablet 0   azelastine (ASTELIN) 0.1 % nasal spray Place 2 sprays into both nostrils 2 (two) times daily. Use in each nostril as directed 30 mL 12   celecoxib (CELEBREX) 100 MG capsule Take 1 capsule (100 mg total) by mouth 2 (two) times daily as needed for moderate pain. 180 capsule 1   ezetimibe (ZETIA) 10 MG tablet TAKE 1 TABLET BY MOUTH EVERY DAY 90 tablet 2   metoprolol succinate (TOPROL-XL) 25 MG 24 hr tablet Take 1 tablet (25 mg total) by mouth daily. Patient has to keep upcoming appointment for any future refills. 90 tablet 0   Multiple Vitamin (MULTIVITAMIN) capsule Take 1 capsule by mouth daily.     nitroGLYCERIN (NITROSTAT) 0.4 MG SL tablet Place 1 tablet (0.4 mg total) under the tongue every 5 (five) minutes as needed. 25 tablet 3  omeprazole (PRILOSEC) 20 MG capsule TAKE 1 CAPSULE BY MOUTH DAILY BEFORE BREAKFAST. 90 capsule 3   No current facility-administered medications for this visit.    Allergies  Patient has no known allergies.  Electrocardiogram:   09/07/19  SR rate 64 RBBB LAFB old ILMI  Assessment and Plan CAD:  Stable with no angina and good activity level.  Continue medical Rx last myovue 2013 old IMI no ischemia  He is active and does not want stress test as he had normal one right before his MI No indication for cath at this time   Chol:  On statin LDL at goal 52 08/15/21   on lipitor 40 mg and zetia 10 mg labs with primary   GERD:  On prilosec discussed low carb diet   RBBB: chronic no high  grade AV block yearly ECG   PSA:  Chronically elevated 4.09 08/15/21  follow   OSA:  wears CPAP recently received new machine F/U Dr Ander Slade  F/U with me in a year  Jenkins Rouge

## 2022-02-06 ENCOUNTER — Other Ambulatory Visit: Payer: Self-pay | Admitting: Cardiovascular Disease

## 2022-02-10 ENCOUNTER — Encounter: Payer: Self-pay | Admitting: Cardiovascular Disease

## 2022-02-10 ENCOUNTER — Ambulatory Visit: Payer: Medicare PPO | Admitting: Cardiovascular Disease

## 2022-02-10 VITALS — BP 122/78 | HR 69 | Ht 69.0 in | Wt 195.0 lb

## 2022-02-10 DIAGNOSIS — I25810 Atherosclerosis of coronary artery bypass graft(s) without angina pectoris: Secondary | ICD-10-CM

## 2022-02-10 DIAGNOSIS — I10 Essential (primary) hypertension: Secondary | ICD-10-CM

## 2022-02-10 DIAGNOSIS — I4510 Unspecified right bundle-branch block: Secondary | ICD-10-CM

## 2022-02-10 NOTE — Patient Instructions (Signed)
Medication Instructions:  Your physician recommends that you continue on your current medications as directed. Please refer to the Current Medication list given to you today.  *If you need a refill on your cardiac medications before your next appointment, please call your pharmacy*  Lab Work: If you have labs (blood work) drawn today and your tests are completely normal, you will receive your results only by: MyChart Message (if you have MyChart) OR A paper copy in the mail If you have any lab test that is abnormal or we need to change your treatment, we will call you to review the results.  Testing/Procedures: None ordered today.  Follow-Up: At [facility], you and your health needs are our priority.  As part of our continuing mission to provide you with exceptional heart care, we have created designated Provider Care Teams.  These Care Teams include your primary Cardiologist (physician) and Advanced Practice Providers (APPs -  Physician Assistants and Nurse Practitioners) who all work together to provide you with the care you need, when you need it.  We recommend signing up for the patient portal called "MyChart".  Sign up information is provided on this After Visit Summary.  MyChart is used to connect with patients for Virtual Visits (Telemedicine).  Patients are able to view lab/test results, encounter notes, upcoming appointments, etc.  Non-urgent messages can be sent to your provider as well.   To learn more about what you can do with MyChart, go to [email or web address].    Your next appointment:   12 month(s)  The format for your next appointment:   In Person  Provider:   Peter Nishan, MD {   Important Information About Sugar       

## 2022-02-20 ENCOUNTER — Ambulatory Visit (INDEPENDENT_AMBULATORY_CARE_PROVIDER_SITE_OTHER): Payer: Medicare PPO

## 2022-02-20 VITALS — BP 148/85 | HR 62 | Temp 97.8°F | Resp 16 | Ht 69.0 in | Wt 197.0 lb

## 2022-02-20 DIAGNOSIS — Z0000 Encounter for general adult medical examination without abnormal findings: Secondary | ICD-10-CM | POA: Diagnosis not present

## 2022-02-20 NOTE — Patient Instructions (Signed)
Benjamin Adkins , Thank you for taking time to come for your Medicare Wellness Visit. I appreciate your ongoing commitment to your health goals. Please review the following plan we discussed and let me know if I can assist you in the future.   Screening recommendations/referrals: Colonoscopy: no longer needed Recommended yearly ophthalmology/optometry visit for glaucoma screening and checkup Recommended yearly dental visit for hygiene and checkup  Vaccinations: Influenza vaccine: up to date Pneumococcal vaccine: up to date Tdap vaccine: up to date Shingles vaccine: up to date   Covid-19: completed  Advanced directives: yes, not file  Conditions/risks identified: see problem list   Next appointment: Follow up in one year for your annual wellness visit.   Preventive Care 45 Years and Older, Male Preventive care refers to lifestyle choices and visits with your health care provider that can promote health and wellness. What does preventive care include? A yearly physical exam. This is also called an annual well check. Dental exams once or twice a year. Routine eye exams. Ask your health care provider how often you should have your eyes checked. Personal lifestyle choices, including: Daily care of your teeth and gums. Regular physical activity. Eating a healthy diet. Avoiding tobacco and drug use. Limiting alcohol use. Practicing safe sex. Taking low doses of aspirin every day. Taking vitamin and mineral supplements as recommended by your health care provider. What happens during an annual well check? The services and screenings done by your health care provider during your annual well check will depend on your age, overall health, lifestyle risk factors, and family history of disease. Counseling  Your health care provider may ask you questions about your: Alcohol use. Tobacco use. Drug use. Emotional well-being. Home and relationship well-being. Sexual activity. Eating  habits. History of falls. Memory and ability to understand (cognition). Work and work Statistician. Screening  You may have the following tests or measurements: Height, weight, and BMI. Blood pressure. Lipid and cholesterol levels. These may be checked every 5 years, or more frequently if you are over 42 years old. Skin check. Lung cancer screening. You may have this screening every year starting at age 4 if you have a 30-pack-year history of smoking and currently smoke or have quit within the past 15 years. Fecal occult blood test (FOBT) of the stool. You may have this test every year starting at age 71. Flexible sigmoidoscopy or colonoscopy. You may have a sigmoidoscopy every 5 years or a colonoscopy every 10 years starting at age 13. Prostate cancer screening. Recommendations will vary depending on your family history and other risks. Hepatitis C blood test. Hepatitis B blood test. Sexually transmitted disease (STD) testing. Diabetes screening. This is done by checking your blood sugar (glucose) after you have not eaten for a while (fasting). You may have this done every 1-3 years. Abdominal aortic aneurysm (AAA) screening. You may need this if you are a current or former smoker. Osteoporosis. You may be screened starting at age 84 if you are at high risk. Talk with your health care provider about your test results, treatment options, and if necessary, the need for more tests. Vaccines  Your health care provider may recommend certain vaccines, such as: Influenza vaccine. This is recommended every year. Tetanus, diphtheria, and acellular pertussis (Tdap, Td) vaccine. You may need a Td booster every 10 years. Zoster vaccine. You may need this after age 60. Pneumococcal 13-valent conjugate (PCV13) vaccine. One dose is recommended after age 3. Pneumococcal polysaccharide (PPSV23) vaccine. One dose is recommended  after age 1. Talk to your health care provider about which screenings and  vaccines you need and how often you need them. This information is not intended to replace advice given to you by your health care provider. Make sure you discuss any questions you have with your health care provider. Document Released: 08/03/2015 Document Revised: 03/26/2016 Document Reviewed: 05/08/2015 Elsevier Interactive Patient Education  2017 [place] Prevention in the Home Falls can cause injuries. They can happen to people of all ages. There are many things you can do to make your home safe and to help prevent falls. What can I do on the outside of my home? Regularly fix the edges of walkways and driveways and fix any cracks. Remove anything that might make you trip as you walk through a door, such as a raised step or threshold. Trim any bushes or trees on the path to your home. Use bright outdoor lighting. Clear any walking paths of anything that might make someone trip, such as rocks or tools. Regularly check to see if handrails are loose or broken. Make sure that both sides of any steps have handrails. Any raised decks and porches should have guardrails on the edges. Have any leaves, snow, or ice cleared regularly. Use sand or salt on walking paths during winter. Clean up any spills in your garage right away. This includes oil or grease spills. What can I do in the bathroom? Use night lights. Install grab bars by the toilet and in the tub and shower. Do not use towel bars as grab bars. Use non-skid mats or decals in the tub or shower. If you need to sit down in the shower, use a plastic, non-slip stool. Keep the floor dry. Clean up any water that spills on the floor as soon as it happens. Remove soap buildup in the tub or shower regularly. Attach bath mats securely with double-sided non-slip rug tape. Do not have throw rugs and other things on the floor that can make you trip. What can I do in the bedroom? Use night lights. Make sure that you have a light by your  bed that is easy to reach. Do not use any sheets or blankets that are too big for your bed. They should not hang down onto the floor. Have a firm chair that has side arms. You can use this for support while you get dressed. Do not have throw rugs and other things on the floor that can make you trip. What can I do in the kitchen? Clean up any spills right away. Avoid walking on wet floors. Keep items that you use a lot in easy-to-reach places. If you need to reach something above you, use a strong step stool that has a grab bar. Keep electrical cords out of the way. Do not use floor polish or wax that makes floors slippery. If you must use wax, use non-skid floor wax. Do not have throw rugs and other things on the floor that can make you trip. What can I do with my stairs? Do not leave any items on the stairs. Make sure that there are handrails on both sides of the stairs and use them. Fix handrails that are broken or loose. Make sure that handrails are as long as the stairways. Check any carpeting to make sure that it is firmly attached to the stairs. Fix any carpet that is loose or worn. Avoid having throw rugs at the top or bottom of the stairs. If you  do have throw rugs, attach them to the floor with carpet tape. Make sure that you have a light switch at the top of the stairs and the bottom of the stairs. If you do not have them, ask someone to add them for you. What else can I do to help prevent falls? Wear shoes that: Do not have high heels. Have rubber bottoms. Are comfortable and fit you well. Are closed at the toe. Do not wear sandals. If you use a stepladder: Make sure that it is fully opened. Do not climb a closed stepladder. Make sure that both sides of the stepladder are locked into place. Ask someone to hold it for you, if possible. Clearly mark and make sure that you can see: Any grab bars or handrails. First and last steps. Where the edge of each step is. Use tools that  help you move around (mobility aids) if they are needed. These include: Canes. Walkers. Scooters. Crutches. Turn on the lights when you go into a dark area. Replace any light bulbs as soon as they burn out. Set up your furniture so you have a clear path. Avoid moving your furniture around. If any of your floors are uneven, fix them. If there are any pets around you, be aware of where they are. Review your medicines with your doctor. Some medicines can make you feel dizzy. This can increase your chance of falling. Ask your doctor what other things that you can do to help prevent falls. This information is not intended to replace advice given to you by your health care provider. Make sure you discuss any questions you have with your health care provider. Document Released: 05/03/2009 Document Revised: 12/13/2015 Document Reviewed: 08/11/2014 Elsevier Interactive Patient Education  2017 [employer].

## 2022-02-20 NOTE — Progress Notes (Addendum)
Subjective:   Benjamin Adkins is a 81 y.o. male who presents for Medicare Annual/Subsequent preventive examination.  Review of Systems     Cardiac Risk Factors include: advanced age (>49mn, >>77women);male gender;hypertension;dyslipidemia     Objective:    Today's Vitals   02/20/22 1014  BP: (!) 153/88  Pulse: 62  Resp: 16  Temp: 97.8 F (36.6 C)  SpO2: 98%  Weight: 197 lb (89.4 kg)  Height: '5\' 9"'$  (1.753 m)   Body mass index is 29.09 kg/m.     02/20/2022   10:11 AM 02/14/2021   11:46 AM 02/02/2020    9:37 AM 11/25/2018   11:25 AM 11/19/2017   10:14 AM 11/18/2016   10:42 AM 02/27/2016   12:58 PM  Advanced Directives  Does Patient Have a Medical Advance Directive? Yes Yes Yes Yes Yes Yes Yes  Type of AParamedicof [place]orth PortLiving will [place] will [place] will [place]Living will Living will;Healthcare Power of AOakdaleLiving will HPonce Does patient want to make changes to medical advance directive? No - Patient declined  No - Patient declined No - Patient declined     Copy of [place]ef[place] Chart? Yes - validated most recent copy scanned in chart (See row information) No - copy requested No - copy requested Yes - validated most recent copy scanned in chart (See row information) No - copy requested No - copy requested No - copy requested    Current Medications (verified) Outpatient Encounter Medications as of 02/20/2022  Medication Sig   amoxicillin (AMOXIL) 500 MG capsule TAKE 4 CAPSULES BY MOUTH AS NEEDED FOR DENTAL PROCEDURE   aspirin 325 MG EC tablet Take 325 mg by mouth daily.   atorvastatin (LIPITOR) 40 MG tablet TAKE 1 TABLET BY MOUTH DAILY. PLEASE KEEP UPCOMING CARDIOLOGY APPOINTMENT FOR FURTHER REFILLS   azelastine (ASTELIN) 0.1 % nasal spray Place 2 sprays into both nostrils 2 (two) times daily. Use in  each nostril as directed   celecoxib (CELEBREX) 100 MG capsule Take 1 capsule (100 mg total) by mouth 2 (two) times daily as needed for moderate pain.   ezetimibe (ZETIA) 10 MG tablet TAKE 1 TABLET BY MOUTH EVERY DAY   metoprolol succinate (TOPROL-XL) 25 MG 24 hr tablet Take 1 tablet (25 mg total) by mouth daily. Patient has to keep upcoming appointment for any future refills.   Multiple Vitamin (MULTIVITAMIN) capsule Take 1 capsule by mouth daily.   nitroGLYCERIN (NITROSTAT) 0.4 MG SL tablet Place 1 tablet (0.4 mg total) under the tongue every 5 (five) minutes as needed.   omeprazole (PRILOSEC) 20 MG capsule TAKE 1 CAPSULE BY MOUTH DAILY BEFORE BREAKFAST.   No facility-administered encounter medications on file as of 02/20/2022.    Allergies (verified) Patient has no known allergies.   History: Past Medical History:  Diagnosis Date   Coronary atherosclerosis of unspecified type of vessel, native or graft    MI 2006, h/o CABG   DJD (degenerative joint disease)    Elevated prostate specific antigen (PSA)    Elevated PSA    Esophageal reflux    HTN (hypertension)    Hyperlipidemia    nmr lipoprofile 2004: LDL 154 (1653/711), HDL 42, TG 80.NMR  LDL =,125, but PMH  of MI LDL goal=,70   Hypertension    Myocardial infarction ([facility]    2006   Other cataract  Personal history of colonic polyps    Shortness of breath    DOE   Sleep apnea    uses CPAP   Past Surgical History:  Procedure Laterality Date   CARDIAC CATHETERIZATION     2006   CATARACT EXTRACTION Bilateral    COLONOSCOPY W/ POLYPECTOMY     CORONARY ARTERY BYPASS GRAFT  2006   X 4    HERNIA REPAIR     VENTRAL, LEFT & RIGHT GROIN   INGUINAL HERNIA REPAIR     TOTAL KNEE ARTHROPLASTY  2005   TOTAL KNEE ARTHROPLASTY  2006   TOTAL KNEE ARTHROPLASTY  08/25/2011   Procedure: TOTAL KNEE ARTHROPLASTY;  Surgeon: Lorn Junes, MD;  Location: [facility];  Service: Orthopedics;  Laterality: Right;  DR Ephriam Knuckles 38 MINUTES FOR  SURGERY   Family History  Problem Relation Age of Onset   Cancer Father        stomach   Stomach cancer Father    Hypertension Mother    Breast cancer Mother    Coronary artery disease Mother    Heart disease Mother    Breast cancer Sister    Crohn's disease Sister    Arthritis Sister    Arthritis Brother    Heart attack Paternal Grandfather 20   Colon cancer Neg Hx    Prostate cancer Neg Hx    Social History   Socioeconomic History   Marital status: Widowed    Spouse name: Not on file   Number of children: 1   Years of education: Not on file   Highest education level: Not on file  Occupational History   Occupation: retired  Tobacco Use   Smoking status: Former    Packs/day: 0.25    Years: 3.00    Total pack years: 0.75    Types: Cigarettes    Start date: 04/12/1959    Quit date: 08/12/1961    Years since quitting: 60.5   Smokeless tobacco: Never   Tobacco comments:    during college  Vaping Use   Vaping Use: Never used  Substance and Sexual Activity   Alcohol use: Yes    Alcohol/week: 7.0 standard drinks of alcohol    Types: 4 Glasses of wine, 3 Cans of beer per week    Comment: Social   Drug use: No   Sexual activity: Yes  Other Topics Concern   Not on file  Social History Narrative   Lives alone.    Has a daughter in [place] , [place] G-son   Daughter: Tamera Reason [phone]   Social Determinants of Health   Financial Resource Strain: Low Risk  (02/14/2021)   Overall Financial Resource Strain (CARDIA)    Difficulty of Paying Living Expenses: Not hard at all  Food Insecurity: No Food Insecurity (02/14/2021)   Hunger Vital Sign    Worried About Running Out of Food in the Last Year: Never true    Ran Out of Food in the Last Year: Never true  Transportation Needs: No Transportation Needs (02/14/2021)   PRAPARE - Hydrologist (Medical): No    Lack of Transportation (Non-Medical): No  Physical Activity: Insufficiently Active  (02/14/2021)   Exercise Vital Sign    Days of Exercise per Week: 3 days    Minutes of Exercise per Session: 40 min  Stress: No Stress Concern Present (02/14/2021)   [place]    Feeling of Stress : Not  at all  Social Connections: Moderately Integrated (02/14/2021)   Social Connection and Isolation Panel [NHANES]    Frequency of Communication with Friends and Family: More than three times a week    Frequency of Social Gatherings with Friends and Family: More than three times a week    Attends Religious Services: More than 4 times per year    Active Member of [employer] or Organizations: Yes    Attends Archivist Meetings: More than 4 times per year    Marital Status: Widowed    Tobacco Counseling Counseling given: Not Answered Tobacco comments: during college   Clinical Intake:  Pre-visit preparation completed: Yes  Pain : No/denies pain     BMI - recorded: 29.09 Nutritional Status: BMI 25 -29 Overweight Nutritional Risks: None Diabetes: No  How often do you need to have someone help you when you read instructions, pamphlets, or other written materials from your doctor or pharmacy?: 1 - Never  Diabetic?no  Interpreter Needed?: No  Information entered by :: [place] of Daily Living    02/20/2022   10:16 AM  In your present state of health, do you have any difficulty performing the following activities:  Hearing? 0  Vision? 0  Difficulty concentrating or making decisions? 0  Walking or climbing stairs? 0  Dressing or bathing? 0  Doing errands, shopping? 0  Preparing Food and eating ? N  Using the Toilet? N  In the past six months, have you accidently leaked urine? N  Do you have problems with loss of bowel control? N  Managing your Medications? N  Managing your Finances? N  Housekeeping or managing your Housekeeping? N    Patient Care Team: Colon Branch, MD as PCP -  General (Internal Medicine) Josue Hector, MD as PCP - Cardiology (Cardiology) Marin Olp Rudell Cobb, MD as Consulting Physician (Oncology) Nickie Retort, MD (Inactive) as Consulting Physician (Urology)  Indicate any recent Medical Services you may have received from other than [facility] providers in the past year (date may be approximate).     Assessment:   This is a routine wellness examination for Irvin.  Hearing/Vision screen No results found.  Dietary issues and exercise activities discussed: Current Exercise Habits: Home exercise routine, Type of exercise: walking;stretching;strength training/weights, Time (Minutes): 60, Frequency (Times/Week): 7, Weekly Exercise (Minutes/Week): 420, Intensity: Mild, Exercise limited by: None identified   Goals Addressed   None    Depression Screen    02/20/2022   10:12 AM 08/15/2021   10:06 AM 02/14/2021   11:48 AM 08/14/2020    9:27 AM 11/25/2018   11:25 AM 01/14/2018   10:21 AM 11/18/2016   10:18 AM  PHQ 2/9 Scores  PHQ - 2 Score 0 0 0 0 0 0 0    Fall Risk    02/20/2022   10:12 AM 08/15/2021   10:06 AM 02/14/2021   11:47 AM 08/14/2020    9:27 AM 04/05/2019    9:57 AM  Fall Risk   Falls in the past year? 0 0 0 0 0  Number falls in past yr: 0 0 0 0   Injury with Fall? 0 0 0 0   Risk for fall due to : No Fall Risks      Follow up Falls evaluation completed Falls evaluation completed Falls prevention discussed  Falls evaluation completed    [place]:  Any stairs in or around the home? Yes  If so,  are there any without handrails? No  Home free of loose throw rugs in walkways, pet beds, electrical cords, etc? Yes  Adequate lighting in your home to reduce risk of falls? Yes   ASSISTIVE DEVICES UTILIZED TO PREVENT FALLS:  Life alert? No  Use of a cane, walker or w/c? No  Grab bars in the bathroom? No  Shower chair or bench in shower? No  Elevated toilet seat or a handicapped toilet? No   TIMED UP AND  GO:  Was the test performed? Yes .  Length of time to ambulate 10 feet: 10 sec.   Gait steady and fast without use of assistive device  Cognitive Function:        02/20/2022   10:20 AM  6CIT Screen  What Year? 0 points  What month? 0 points  What time? 0 points  Count back from 20 0 points  Months in reverse 0 points  Repeat phrase 0 points  Total Score 0 points    Immunizations Immunization History  Administered Date(s) Administered   Fluad Quad(high Dose 65+) 03/17/2019   Influenza Split 05/27/2011, 04/02/2012   Influenza Whole 05/04/2007, 05/18/2008, 05/22/2009, 05/16/2010   Influenza, High Dose Seasonal PF 04/28/2013, 04/23/2015, 04/10/2016, 04/24/2021   Influenza,inj,Quad PF,6+ Mos 08/10/2014   Influenza-Unspecified 04/28/2017, 04/29/2018, 04/20/2020   Moderna Sars-Covid-2 Vaccination 08/01/2019, 08/29/2019, 05/17/2020   PNEUMOCOCCAL CONJUGATE-20 08/15/2021   Pfizer Covid-19 Vaccine Bivalent Booster 25yr & up 05/15/2021   Pneumococcal Conjugate-13 04/23/2015   Pneumococcal Polysaccharide-23 08/09/2013   Tetanus 08/23/2013   Zoster Recombinat (Shingrix) 04/16/2019, 10/01/2019   Zoster, Live 07/02/2011    TDAP status: Up to date  Flu Vaccine status: Up to date  Pneumococcal vaccine status: Up to date  Covid-19 vaccine status: Completed vaccines  Qualifies for Shingles Vaccine? Yes   Zostavax completed No   Shingrix Completed?: Yes  Screening Tests Health Maintenance  Topic Date Due   COVID-19 Vaccine (5 - Moderna series) 09/15/2021   INFLUENZA VACCINE  02/18/2022   TETANUS/TDAP  08/24/2023   Pneumonia Vaccine 81 Years old  Completed   Zoster Vaccines- Shingrix  Completed   HPV VACCINES  Aged Out    Health Maintenance  Health Maintenance Due  Topic Date Due   COVID-19 Vaccine (5 - Moderna series) 09/15/2021   INFLUENZA VACCINE  02/18/2022    Colorectal cancer screening: No longer required.   Lung Cancer Screening: (Low Dose CT Chest  recommended if Age 325-80years, 30 pack-year currently smoking OR have quit w/in 15years.) does not qualify.   Lung Cancer Screening Referral: n/a  Additional Screening:  Hepatitis C Screening: does not qualify; Completed aged out  Vision Screening: Recommended annual ophthalmology exams for early detection of glaucoma and other disorders of the eye. Is the patient up to date with their annual eye exam?  Yes  Who is the provider or what is the name of the office in which the patient attends annual eye exams? [facility] If pt is not established with a provider, would they like to be referred to a provider to establish care? No .   Dental Screening: Recommended annual dental exams for proper oral hygiene  Community Resource Referral / Chronic Care Management: CRR required this visit?  No   CCM required this visit?  No      Plan:     I have personally reviewed and noted the following in the patient's chart:   Medical and social history Use of alcohol, tobacco or illicit drugs  Current  medications and supplements including opioid prescriptions. Patient is not currently taking opioid prescriptions. Functional ability and status Nutritional status Physical activity Advanced directives List of other physicians Hospitalizations, surgeries, and ER visits in previous 12 months Vitals Screenings to include cognitive, depression, and falls Referrals and appointments  In addition, I have reviewed and discussed with patient certain preventive protocols, quality metrics, and best practice recommendations. A written personalized care plan for preventive services as well as general preventive health recommendations were provided to patient.     Duard Brady Aashvi Rezabek, [place]   02/20/2022   Nurse Notes: none  I have reviewed and agree with [facility] documentation.  Kathlene November, MD

## 2022-04-01 ENCOUNTER — Other Ambulatory Visit: Payer: Self-pay | Admitting: Internal Medicine

## 2022-04-09 ENCOUNTER — Other Ambulatory Visit: Payer: Self-pay | Admitting: Cardiovascular Disease

## 2022-06-17 ENCOUNTER — Other Ambulatory Visit: Payer: Self-pay | Admitting: Internal Medicine

## 2022-06-19 ENCOUNTER — Other Ambulatory Visit: Payer: Self-pay | Admitting: Cardiovascular Disease

## 2022-07-29 DIAGNOSIS — G4733 Obstructive sleep apnea (adult) (pediatric): Secondary | ICD-10-CM | POA: Diagnosis not present

## 2022-08-21 ENCOUNTER — Ambulatory Visit (INDEPENDENT_AMBULATORY_CARE_PROVIDER_SITE_OTHER): Payer: Medicare PPO | Admitting: Internal Medicine

## 2022-08-21 ENCOUNTER — Encounter: Payer: Self-pay | Admitting: Internal Medicine

## 2022-08-21 VITALS — BP 136/78 | HR 59 | Temp 98.2°F | Resp 16 | Ht 69.0 in | Wt 202.1 lb

## 2022-08-21 DIAGNOSIS — E785 Hyperlipidemia, unspecified: Secondary | ICD-10-CM

## 2022-08-21 DIAGNOSIS — Z0001 Encounter for general adult medical examination with abnormal findings: Secondary | ICD-10-CM | POA: Diagnosis not present

## 2022-08-21 DIAGNOSIS — Z0000 Encounter for general adult medical examination without abnormal findings: Secondary | ICD-10-CM

## 2022-08-21 DIAGNOSIS — G4733 Obstructive sleep apnea (adult) (pediatric): Secondary | ICD-10-CM | POA: Diagnosis not present

## 2022-08-21 DIAGNOSIS — R739 Hyperglycemia, unspecified: Secondary | ICD-10-CM

## 2022-08-21 DIAGNOSIS — M159 Polyosteoarthritis, unspecified: Secondary | ICD-10-CM

## 2022-08-21 DIAGNOSIS — I10 Essential (primary) hypertension: Secondary | ICD-10-CM | POA: Diagnosis not present

## 2022-08-21 DIAGNOSIS — M150 Primary generalized (osteo)arthritis: Secondary | ICD-10-CM

## 2022-08-21 DIAGNOSIS — R972 Elevated prostate specific antigen [PSA]: Secondary | ICD-10-CM | POA: Diagnosis not present

## 2022-08-21 LAB — CBC WITH DIFFERENTIAL/PLATELET
Basophils Absolute: 0.1 10*3/uL (ref 0.0–0.1)
Basophils Relative: 0.8 % (ref 0.0–3.0)
Eosinophils Absolute: 0.2 10*3/uL (ref 0.0–0.7)
Eosinophils Relative: 1.9 % (ref 0.0–5.0)
HCT: 44.3 % (ref 39.0–52.0)
Hemoglobin: 15.5 g/dL (ref 13.0–17.0)
Lymphocytes Relative: 24.1 % (ref 12.0–46.0)
Lymphs Abs: 2 10*3/uL (ref 0.7–4.0)
MCHC: 34.9 g/dL (ref 30.0–36.0)
MCV: 91.1 fl (ref 78.0–100.0)
Monocytes Absolute: 0.8 10*3/uL (ref 0.1–1.0)
Monocytes Relative: 9.8 % (ref 3.0–12.0)
Neutro Abs: 5.2 10*3/uL (ref 1.4–7.7)
Neutrophils Relative %: 63.4 % (ref 43.0–77.0)
Platelets: 160 10*3/uL (ref 150.0–400.0)
RBC: 4.87 Mil/uL (ref 4.22–5.81)
RDW: 13.7 % (ref 11.5–15.5)
WBC: 8.2 10*3/uL (ref 4.0–10.5)

## 2022-08-21 LAB — COMPREHENSIVE METABOLIC PANEL
ALT: 25 U/L (ref 0–53)
AST: 29 U/L (ref 0–37)
Albumin: 4.5 g/dL (ref 3.5–5.2)
Alkaline Phosphatase: 80 U/L (ref 39–117)
BUN: 19 mg/dL (ref 6–23)
CO2: 28 mEq/L (ref 19–32)
Calcium: 9.5 mg/dL (ref 8.4–10.5)
Chloride: 103 mEq/L (ref 96–112)
Creatinine, Ser: 0.91 mg/dL (ref 0.40–1.50)
GFR: 79.16 mL/min (ref >60.00)
Glucose, Bld: 85 mg/dL (ref 70–99)
Potassium: 4.2 mEq/L (ref 3.5–5.1)
Sodium: 139 mEq/L (ref 135–145)
Total Bilirubin: 0.9 mg/dL (ref 0.2–1.2)
Total Protein: 7 g/dL (ref 6.0–8.3)

## 2022-08-21 LAB — LIPID PANEL
Cholesterol: 126 mg/dL (ref 0–200)
HDL: 47.7 mg/dL (ref >39.00)
LDL Cholesterol: 58 mg/dL (ref 0–99)
NonHDL: 77.97
Total CHOL/HDL Ratio: 3
Triglycerides: 100 mg/dL (ref 0.0–149.0)
VLDL: 20 mg/dL (ref 0.0–40.0)

## 2022-08-21 LAB — TSH: TSH: 1.63 u[IU]/mL (ref 0.35–5.50)

## 2022-08-21 LAB — HEMOGLOBIN A1C: Hgb A1c MFr Bld: 5.5 % (ref 4.6–6.5)

## 2022-08-21 NOTE — Progress Notes (Signed)
Subjective:    Patient ID: Benjamin Adkins, male    DOB: 1940-09-08, 82 y.o.   MRN: [id number]  DOS:  08/21/2022 Type of visit - description: Here for CPX  In general feeling well. The patient had some degree of decreased taste and smell however after he had COVID about a year ago sxs got much worse. He denies headache, nausea, diplopia or nasal discharge.  Has pressure urticaria, still have symptoms on and off but not severe.   Review of Systems  Other than above, a 14 point review of systems is negative     Past Medical History:  Diagnosis Date   Coronary atherosclerosis of unspecified type of vessel, native or graft    MI 2006, h/o CABG   DJD (degenerative joint disease)    Elevated prostate specific antigen (PSA)    Elevated PSA    Esophageal reflux    HTN (hypertension)    Hyperlipidemia    nmr lipoprofile 2004: LDL 154 (1653/711), HDL 42, TG 80.NMR  LDL =,125, but PMH  of MI LDL goal=,70   Hypertension    Myocardial infarction [facility])    2006   Other cataract    Personal history of colonic polyps    Shortness of breath    DOE   Sleep apnea    uses CPAP    Past Surgical History:  Procedure Laterality Date   CARDIAC CATHETERIZATION     2006   CATARACT EXTRACTION Bilateral    COLONOSCOPY W/ POLYPECTOMY     CORONARY ARTERY BYPASS GRAFT  2006   X 4    HERNIA REPAIR     VENTRAL, LEFT & RIGHT GROIN   INGUINAL HERNIA REPAIR     TOTAL KNEE ARTHROPLASTY  2005   TOTAL KNEE ARTHROPLASTY  2006   TOTAL KNEE ARTHROPLASTY  08/25/2011   Procedure: TOTAL KNEE ARTHROPLASTY;  Surgeon: Lorn Junes, MD;  Location: [facility];  Service: Orthopedics;  Laterality: Right;  DR Ephriam Knuckles 90 MINUTES FOR SURGERY   Social History   Socioeconomic History   Marital status: Widowed    Spouse name: Not on file   Number of children: 1   Years of education: Not on file   Highest education level: Not on file  Occupational History   Occupation: retired  Tobacco Use   Smoking status:  Former    Packs/day: 0.25    Years: 3.00    Total pack years: 0.75    Types: Cigarettes    Start date: 04/12/1959    Quit date: 08/12/1961    Years since quitting: 61.0   Smokeless tobacco: Never   Tobacco comments:    during college  Vaping Use   Vaping Use: Never used  Substance and Sexual Activity   Alcohol use: Yes    Alcohol/week: 7.0 standard drinks of alcohol    Types: 4 Glasses of wine, 3 Cans of beer per week    Comment: Social   Drug use: No   Sexual activity: Yes  Other Topics Concern   Not on file  Social History Narrative   Lives alone.    Has a daughter in [place] , [place] G-son   Daughter: Tamera Reason [phone]   Social Determinants of Health   Financial Resource Strain: Low Risk  (02/14/2021)   Overall Financial Resource Strain (CARDIA)    Difficulty of Paying Living Expenses: Not hard at all  Food Insecurity: No Food Insecurity (02/14/2021)   Hunger Vital Sign  Worried About Charity fundraiser in the Last Year: Never true    [place] in the Last Year: Never true  Transportation Needs: No Transportation Needs (02/14/2021)   PRAPARE - Hydrologist (Medical): No    Lack of Transportation (Non-Medical): No  Physical Activity: Insufficiently Active (02/14/2021)   Exercise Vital Sign    Days of Exercise per Week: 3 days    Minutes of Exercise per Session: 40 min  Stress: No Stress Concern Present (02/14/2021)   [employer]    Feeling of Stress : Not at all  Social Connections: Moderately Integrated (02/14/2021)   Social Connection and Isolation Panel [NHANES]    Frequency of Communication with Friends and Family: More than three times a week    Frequency of Social Gatherings with Friends and Family: More than three times a week    Attends Religious Services: More than 4 times per year    Active Member of [employer] or Organizations: Yes    Attends Theatre manager Meetings: More than 4 times per year    Marital Status: Widowed  Intimate Partner Violence: Not At Risk (02/14/2021)   Humiliation, Afraid, Rape, and Kick questionnaire    Fear of Current or Ex-Partner: No    Emotionally Abused: No    Physically Abused: No    Sexually Abused: No    Current Outpatient Medications  Medication Instructions   amoxicillin (AMOXIL) 500 MG capsule TAKE 4 CAPSULES BY MOUTH AS NEEDED FOR DENTAL PROCEDURE   aspirin EC 325 mg, Oral, Daily   atorvastatin (LIPITOR) 40 mg, Oral, Daily   azelastine (ASTELIN) 0.1 % nasal spray 2 sprays, Each Nare, 2 times daily, Use in each nostril as directed   celecoxib (CELEBREX) 100 mg, Oral, 2 times daily PRN   cetirizine (ZYRTEC) 10 mg, Oral, Daily   ezetimibe (ZETIA) 10 MG tablet TAKE 1 TABLET BY MOUTH EVERY DAY   metoprolol succinate (TOPROL-XL) 25 mg, Oral, Daily, Patient has to keep upcoming appointment for any future refills.   Multiple Vitamin (MULTIVITAMIN) capsule 1 capsule, Oral, Daily,     nitroGLYCERIN (NITROSTAT) 0.4 mg, Sublingual, Every 5 min PRN   omeprazole (PRILOSEC) 20 MG capsule TAKE 1 CAPSULE BY MOUTH EVERY DAY BEFORE BREAKFAST       Objective:   Physical Exam BP 136/78   Pulse (!) 59   Temp 98.2 F (36.8 C) (Oral)   Resp 16   Ht '5\' 9"'$  (1.753 m)   Wt 202 lb 2 oz (91.7 kg)   SpO2 97%   BMI 29.85 kg/m  General: Well developed, NAD, BMI noted Neck: No  thyromegaly  HEENT:  Normocephalic . Face symmetric, atraumatic Lungs:  CTA B Normal respiratory effort, no intercostal retractions, no accessory muscle use. Heart: RRR,  no murmur.  Abdomen:  Not distended, soft, non-tender. No rebound or rigidity.   Lower extremities: no pretibial edema bilaterally  Skin: Exposed areas without rash. Not pale. Not jaundice Neurologic:  alert & oriented X3.  Speech normal, gait appropriate for age and unassisted Strength symmetric and appropriate for age.  Psych: Cognition and judgment appear  intact.  Cooperative with normal attention span and concentration.  Behavior appropriate. No anxious or depressed appearing.     Assessment     Assessment  Prediabetes HTN Hyperlipidemia CAD, MI 2006, CABG, RBBB OSA , on CPAP DJD Knee replacement-- on abx pre-dental procedures and: BP today is  very good, continue Elevated PSA Melanoma : L ear, removed  2022  PLAN: For CPX Prediabetes: Check A1c HTN: BP satisfactory today, ambulatory BPs reportedly normal, continue metoprolol. Hyperlipidemia: On Lipitor, checking labs CAD: Denies chest pain or difficulty breathing.  Last visit with cardiology 01-2022.  Noted to be stable. Pressure urticaria: Overall symptoms are controlled, takes Zyrtec daily  Anosmia: No sense of taste  or smell senses, worse after he had COVID about a year ago.  No other symptoms, fortunately patient is not bothered by this condition.  Recommend observation.  To call if he developed headache, nausea, diplopia etc. OSA: Good CPAP compliance DJD: At the end of the visit he did report pain at the base of the right thumb, exam showed changes consistent with DJD and perhaps slightly puffiness of the wrist but no redness or warmness.Marland Kitchen  He takes Celebrex sporadically. RTC 1 year     In addition to CPX, we addressed his chronic medical problems including a new 1 (anosmia).

## 2022-08-21 NOTE — Patient Instructions (Addendum)
Check the  blood pressure regularly BP GOAL is between 110/65 and  135/85. If it is consistently higher or lower, let me know  If you decide to have another colonoscopy, please call the gastroenterology office: [phone]  [facility] : Get the blood work     [place], [place] back for a physical exam in 1 year

## 2022-08-22 ENCOUNTER — Encounter: Payer: Self-pay | Admitting: Internal Medicine

## 2022-08-22 NOTE — Assessment & Plan Note (Signed)
For CPX Prediabetes: Check A1c HTN: BP satisfactory today, ambulatory BPs reportedly normal, continue metoprolol. Hyperlipidemia: On Lipitor, checking labs CAD: Denies chest pain or difficulty breathing.  Last visit with cardiology 01-2022.  Noted to be stable. Pressure urticaria: Overall symptoms are controlled, takes Zyrtec daily  Anosmia: No sense of taste  or smell senses, worse after he had COVID about a year ago.  No other symptoms, fortunately patient is not bothered by this condition.  Recommend observation.  To call if he developed headache, nausea, diplopia etc. OSA: Good CPAP compliance DJD: At the end of the visit he did report pain at the base of the right thumb, exam showed changes consistent with DJD and perhaps slightly puffiness of the wrist but no redness or warmness.Marland Kitchen  He takes Celebrex sporadically. RTC 1 year

## 2022-08-22 NOTE — Assessment & Plan Note (Addendum)
-   Tdap: 08/2013 - RSV rec - PNA 23: 07/2013 ; prevnar:04-2015; PNM 20: 07-2021 - s/p  zostavax:06/2011; Shingrix x2 -  covid  vax: UTD - Had a Flu shot  -CCS:  cscope 02/2006 Diverticulosis cscope 04-2016- 1 polyp, next was due 04/2021, so far has elected not to pursue a follow-up colonoscopy due to age but likes to have the phone number of GI in case he does. -prostate ca screening/elevated PSA:  saw urology 08/2019, DRE was wnl.  Never had a biopsy. PSA  2019: 5.4,  08-2019: 4.08; 07-2020: 3.78; 07-2021: 4.09 We had a long conversation about continue checking PSAs versus not.  He is aware that he could have cancer but he will be likely very slow-growing one.  We agree no PSAs. -labs : CMP FLP CBC TSH -Diet and exercise: Discussed - ACP information on file

## 2022-08-29 DIAGNOSIS — G4733 Obstructive sleep apnea (adult) (pediatric): Secondary | ICD-10-CM | POA: Diagnosis not present

## 2022-09-27 DIAGNOSIS — G4733 Obstructive sleep apnea (adult) (pediatric): Secondary | ICD-10-CM | POA: Diagnosis not present

## 2022-09-29 ENCOUNTER — Other Ambulatory Visit: Payer: Self-pay | Admitting: Internal Medicine

## 2022-09-30 ENCOUNTER — Other Ambulatory Visit: Payer: Self-pay | Admitting: Internal Medicine

## 2022-10-28 DIAGNOSIS — G4733 Obstructive sleep apnea (adult) (pediatric): Secondary | ICD-10-CM | POA: Diagnosis not present

## 2022-11-13 ENCOUNTER — Other Ambulatory Visit: Payer: Self-pay | Admitting: Pharmacist

## 2022-11-13 NOTE — Progress Notes (Signed)
Patient was on 2023 Medicare gap list as failing MAC in 2023.   Reviewed recent refill history in EPIC and showed LR was 08/04/2022 for 90 days. Patient should be past due to have filled.  Called patient's pharmacy - they confirmed LR was 11/03/2022 for 90 days and patient picked up.   Will continue to follow adherence in 2024.   Henrene Pastor, PharmD Clinical Pharmacist [facility]

## 2022-11-29 DIAGNOSIS — H524 Presbyopia: Secondary | ICD-10-CM | POA: Diagnosis not present

## 2022-11-29 DIAGNOSIS — H2513 Age-related nuclear cataract, bilateral: Secondary | ICD-10-CM | POA: Diagnosis not present

## 2023-01-19 ENCOUNTER — Telehealth: Payer: Self-pay | Admitting: Internal Medicine

## 2023-01-19 MED ORDER — EZETIMIBE 10 MG PO TABS: 10.0000 mg | ORAL_TABLET | Freq: Every day | ORAL | 1 refills | Status: DC

## 2023-01-19 NOTE — Telephone Encounter (Signed)
Rx sent 

## 2023-01-19 NOTE — Addendum Note (Signed)
Addended byConrad [place] D on: 01/19/2023 04:47 PM   Modules accepted: Orders

## 2023-01-19 NOTE — Telephone Encounter (Signed)
Patient called and need a provider to take   Ezetimibe 10mg  off hold. He states he put it on hold due to being ahead. [facility] call [facility] [address] Please call patient with further questions.

## 2023-02-17 ENCOUNTER — Telehealth: Payer: Self-pay | Admitting: Internal Medicine

## 2023-02-17 NOTE — Telephone Encounter (Signed)
Copied from CRM [id number]. Topic: Medicare AWV >> Feb 17, 2023 10:19 AM Payton Doughty wrote: Reason for CRM: LVM 02/17/23 to r/s AWV. New appt date 02/26/23 at 9:40am, please confirm date change khc  Verlee Rossetti; Care Guide Ambulatory Clinical Support [place] [facility] Direct Dial: [phone]

## 2023-02-25 ENCOUNTER — Ambulatory Visit: Payer: Medicare PPO

## 2023-03-03 ENCOUNTER — Ambulatory Visit (INDEPENDENT_AMBULATORY_CARE_PROVIDER_SITE_OTHER): Payer: Medicare PPO | Admitting: *Deleted

## 2023-03-03 VITALS — BP 171/79 | HR 65 | Ht 69.0 in | Wt 199.0 lb

## 2023-03-03 DIAGNOSIS — Z0000 Encounter for general adult medical examination without abnormal findings: Secondary | ICD-10-CM

## 2023-03-03 NOTE — Patient Instructions (Signed)
Benjamin Adkins , Thank you for taking time to come for your Medicare Wellness Visit. I appreciate your ongoing commitment to your health goals. Please review the following plan we discussed and let me know if I can assist you in the future.     This is a list of the screening recommended for you and due dates:  Health Maintenance  Topic Date Due   DTaP/Tdap/Td vaccine (1 - Tdap) 08/24/2013   COVID-19 Vaccine (5 - 2023-24 season) 03/21/2022   Flu Shot  02/19/2023   Medicare Annual Wellness Visit  03/02/2024   Pneumonia Vaccine  Completed   Zoster (Shingles) Vaccine  Completed   HPV Vaccine  Aged Out   Hepatitis C Screening  Discontinued    Next appointment: Follow up in one year for your annual wellness visit.   Preventive Care 82 Years and Older, Male Preventive care refers to lifestyle choices and visits with your health care provider that can promote health and wellness. What does preventive care include? A yearly physical exam. This is also called an annual well check. Dental exams once or twice a year. Routine eye exams. Ask your health care provider how often you should have your eyes checked. Personal lifestyle choices, including: Daily care of your teeth and gums. Regular physical activity. Eating a healthy diet. Avoiding tobacco and drug use. Limiting alcohol use. Practicing safe sex. Taking low doses of aspirin every day. Taking vitamin and mineral supplements as recommended by your health care provider. What happens during an annual well check? The services and screenings done by your health care provider during your annual well check will depend on your age, overall health, lifestyle risk factors, and family history of disease. Counseling  Your health care provider may ask you questions about your: Alcohol use. Tobacco use. Drug use. Emotional well-being. Home and relationship well-being. Sexual activity. Eating habits. History of falls. Memory and ability to  understand (cognition). Work and work Astronomer. Screening  You may have the following tests or measurements: Height, weight, and BMI. Blood pressure. Lipid and cholesterol levels. These may be checked every 5 years, or more frequently if you are over 82 years old. Skin check. Lung cancer screening. You may have this screening every year starting at age 82 if you have a 30-pack-year history of smoking and currently smoke or have quit within the past 15 years. Fecal occult blood test (FOBT) of the stool. You may have this test every year starting at age 82. Flexible sigmoidoscopy or colonoscopy. You may have a sigmoidoscopy every 5 years or a colonoscopy every 10 years starting at age 82. Prostate cancer screening. Recommendations will vary depending on your family history and other risks. Hepatitis C blood test. Hepatitis B blood test. Sexually transmitted disease (STD) testing. Diabetes screening. This is done by checking your blood sugar (glucose) after you have not eaten for a while (fasting). You may have this done every 1-3 years. Abdominal aortic aneurysm (AAA) screening. You may need this if you are a current or former smoker. Osteoporosis. You may be screened starting at age 82 if you are at high risk. Talk with your health care provider about your test results, treatment options, and if necessary, the need for more tests. Vaccines  Your health care provider may recommend certain vaccines, such as: Influenza vaccine. This is recommended every year. Tetanus, diphtheria, and acellular pertussis (Tdap, Td) vaccine. You may need a Td booster every 10 years. Zoster vaccine. You may need this after age 82.  Pneumococcal 13-valent conjugate (PCV13) vaccine. One dose is recommended after age 82. Pneumococcal polysaccharide (PPSV23) vaccine. One dose is recommended after age 82. Talk to your health care provider about which screenings and vaccines you need and how often you need them. This  information is not intended to replace advice given to you by your health care provider. Make sure you discuss any questions you have with your health care provider. Document Released: 08/03/2015 Document Revised: 03/26/2016 Document Reviewed: 05/08/2015 Elsevier Interactive Patient Education  2017 [employer].  Fall Prevention in the Home Falls can cause injuries. They can happen to people of all ages. There are many things you can do to make your home safe and to help prevent falls. What can I do on the outside of my home? Regularly fix the edges of walkways and driveways and fix any cracks. Remove anything that might make you trip as you walk through a door, such as a raised step or threshold. Trim any bushes or trees on the path to your home. Use bright outdoor lighting. Clear any walking paths of anything that might make someone trip, such as rocks or tools. Regularly check to see if handrails are loose or broken. Make sure that both sides of any steps have handrails. Any raised decks and porches should have guardrails on the edges. Have any leaves, snow, or ice cleared regularly. Use sand or salt on walking paths during winter. Clean up any spills in your garage right away. This includes oil or grease spills. What can I do in the bathroom? Use night lights. Install grab bars by the toilet and in the tub and shower. Do not use towel bars as grab bars. Use non-skid mats or decals in the tub or shower. If you need to sit down in the shower, use a plastic, non-slip stool. Keep the floor dry. Clean up any water that spills on the floor as soon as it happens. Remove soap buildup in the tub or shower regularly. Attach bath mats securely with double-sided non-slip rug tape. Do not have throw rugs and other things on the floor that can make you trip. What can I do in the bedroom? Use night lights. Make sure that you have a light by your bed that is easy to reach. Do not use any sheets or  blankets that are too big for your bed. They should not hang down onto the floor. Have a firm chair that has side arms. You can use this for support while you get dressed. Do not have throw rugs and other things on the floor that can make you trip. What can I do in the kitchen? Clean up any spills right away. Avoid walking on wet floors. Keep items that you use a lot in easy-to-reach places. If you need to reach something above you, use a strong step stool that has a grab bar. Keep electrical cords out of the way. Do not use floor polish or wax that makes floors slippery. If you must use wax, use non-skid floor wax. Do not have throw rugs and other things on the floor that can make you trip. What can I do with my stairs? Do not leave any items on the stairs. Make sure that there are handrails on both sides of the stairs and use them. Fix handrails that are broken or loose. Make sure that handrails are as long as the stairways. Check any carpeting to make sure that it is firmly attached to the stairs. Fix any  carpet that is loose or worn. Avoid having throw rugs at the top or bottom of the stairs. If you do have throw rugs, attach them to the floor with carpet tape. Make sure that you have a light switch at the top of the stairs and the bottom of the stairs. If you do not have them, ask someone to add them for you. What else can I do to help prevent falls? Wear shoes that: Do not have high heels. Have rubber bottoms. Are comfortable and fit you well. Are closed at the toe. Do not wear sandals. If you use a stepladder: Make sure that it is fully opened. Do not climb a closed stepladder. Make sure that both sides of the stepladder are locked into place. Ask someone to hold it for you, if possible. Clearly mark and make sure that you can see: Any grab bars or handrails. First and last steps. Where the edge of each step is. Use tools that help you move around (mobility aids) if they are  needed. These include: Canes. Walkers. Scooters. Crutches. Turn on the lights when you go into a dark area. Replace any light bulbs as soon as they burn out. Set up your furniture so you have a clear path. Avoid moving your furniture around. If any of your floors are uneven, fix them. If there are any pets around you, be aware of where they are. Review your medicines with your doctor. Some medicines can make you feel dizzy. This can increase your chance of falling. Ask your doctor what other things that you can do to help prevent falls. This information is not intended to replace advice given to you by your health care provider. Make sure you discuss any questions you have with your health care provider. Document Released: 05/03/2009 Document Revised: 12/13/2015 Document Reviewed: 08/11/2014 Elsevier Interactive Patient Education  2017 [employer].

## 2023-03-03 NOTE — Progress Notes (Signed)
Subjective:   Benjamin Adkins is a 82 y.o. male who presents for Medicare Annual/Subsequent preventive examination.  Visit Complete: In person  Patient Medicare AWV questionnaire was completed by the patient on 03/02/23; I have confirmed that all information answered by patient is correct and no changes since this date.  Review of Systems     Cardiac Risk Factors include: advanced age (>66men, >65 women);dyslipidemia;hypertension;male gender     Objective:    Today's Vitals   03/03/23 0857 03/03/23 0909  BP: (!) 145/75 (!) 171/79  Pulse: 61 65  Weight: 199 lb (90.3 kg)   Height: 5\' 9"  (1.753 m)    Body mass index is 29.39 kg/m.     03/03/2023    8:59 AM 02/20/2022   10:11 AM 02/14/2021   11:46 AM 02/02/2020    9:37 AM 11/25/2018   11:25 AM 11/19/2017   10:14 AM 11/18/2016   10:42 AM  Advanced Directives  Does Patient Have a Medical Advance Directive? Yes Yes Yes Yes Yes Yes Yes  Type of Estate agent of [place];Living will;Out of facility DNR (pink MOST or yellow form) Healthcare Power of [place];Living will Healthcare Power of [place];Living will Healthcare Power of [place];Living will Healthcare Power of [place];Living will Living will;Healthcare Power of [employer] Power of [place];Living will  Does patient want to make changes to medical advance directive? No - Patient declined No - Patient declined  No - Patient declined No - Patient declined    Copy of Healthcare Power of Attorney in Chart? Yes - validated most recent copy scanned in chart (See row information) Yes - validated most recent copy scanned in chart (See row information) No - copy requested No - copy requested Yes - validated most recent copy scanned in chart (See row information) No - copy requested No - copy requested    Current Medications (verified) Outpatient Encounter Medications as of 03/03/2023  Medication Sig   amoxicillin (AMOXIL) 500 MG capsule TAKE 4 CAPSULES BY MOUTH AS  NEEDED FOR DENTAL PROCEDURE   aspirin 325 MG EC tablet Take 325 mg by mouth daily.   atorvastatin (LIPITOR) 40 MG tablet Take 1 tablet (40 mg total) by mouth daily.   azelastine (ASTELIN) 0.1 % nasal spray Place 2 sprays into both nostrils 2 (two) times daily. Use in each nostril as directed   celecoxib (CELEBREX) 100 MG capsule Take 1 capsule (100 mg total) by mouth 2 (two) times daily as needed for moderate pain. (Patient not taking: Reported on 11/13/2022)   cetirizine (ZYRTEC) 10 MG tablet Take 10 mg by mouth daily.   ezetimibe (ZETIA) 10 MG tablet Take 1 tablet (10 mg total) by mouth daily.   metoprolol succinate (TOPROL-XL) 25 MG 24 hr tablet TAKE 1 TABLET (25 MG TOTAL) BY MOUTH DAILY. PATIENT HAS TO KEEP UPCOMING APPOINTMENT FOR ANY FUTURE REFILLS.   Multiple Vitamin (MULTIVITAMIN) capsule Take 1 capsule by mouth daily.   nitroGLYCERIN (NITROSTAT) 0.4 MG SL tablet Place 1 tablet (0.4 mg total) under the tongue every 5 (five) minutes as needed.   omeprazole (PRILOSEC) 20 MG capsule TAKE 1 CAPSULE BY MOUTH EVERY DAY BEFORE BREAKFAST   No facility-administered encounter medications on file as of 03/03/2023.    Allergies (verified) Patient has no known allergies.   History: Past Medical History:  Diagnosis Date   Coronary atherosclerosis of unspecified type of vessel, native or graft    MI 2006, h/o CABG   DJD (degenerative joint disease)  Elevated prostate specific antigen (PSA)    Elevated PSA    Esophageal reflux    HTN (hypertension)    Hyperlipidemia    nmr lipoprofile 2004: LDL 154 (1653/711), HDL 42, TG 80.NMR  LDL =,125, but PMH  of MI LDL goal=,70   Hypertension    Myocardial infarction [facility])    2006   Other cataract    Personal history of colonic polyps    Shortness of breath    DOE   Sleep apnea    uses CPAP   Past Surgical History:  Procedure Laterality Date   CARDIAC CATHETERIZATION     2006   CATARACT EXTRACTION Bilateral    COLONOSCOPY W/ POLYPECTOMY      CORONARY ARTERY BYPASS GRAFT  2006   X 4    EYE SURGERY     HERNIA REPAIR     VENTRAL, LEFT & RIGHT GROIN   INGUINAL HERNIA REPAIR     JOINT REPLACEMENT     TOTAL KNEE ARTHROPLASTY  2005   TOTAL KNEE ARTHROPLASTY  2006   TOTAL KNEE ARTHROPLASTY  08/25/2011   Procedure: TOTAL KNEE ARTHROPLASTY;  Surgeon: Nilda Simmer, MD;  Location: [facility] OR;  Service: Orthopedics;  Laterality: Right;  DR Anne Ng 90 MINUTES FOR SURGERY   Family History  Problem Relation Age of Onset   Cancer Father        stomach   Stomach cancer Father    Hypertension Mother    Breast cancer Mother    Coronary artery disease Mother    Heart disease Mother    Arthritis Mother    Cancer Mother    Breast cancer Sister    Crohn's disease Sister    Arthritis Sister    Cancer Sister    Arthritis Brother    Heart disease Brother    Heart attack Paternal Grandfather 49   Colon cancer Neg Hx    Prostate cancer Neg Hx    Social History   Socioeconomic History   Marital status: Widowed    Spouse name: Not on file   Number of children: 1   Years of education: Not on file   Highest education level: Not on file  Occupational History   Occupation: retired  Tobacco Use   Smoking status: Former    Current packs/day: 0.00    Average packs/day: 0.3 packs/day for 3.0 years (0.8 ttl pk-yrs)    Types: Cigarettes    Start date: 04/12/1959    Quit date: 08/12/1961    Years since quitting: 61.5   Smokeless tobacco: Never   Tobacco comments:    during college  Vaping Use   Vaping status: Never Used  Substance and Sexual Activity   Alcohol use: Yes    Alcohol/week: 6.0 standard drinks of alcohol    Types: 2 Glasses of wine, 4 Cans of beer per week    Comment: Social   Drug use: No   Sexual activity: Yes  Other Topics Concern   Not on file  Social History Narrative   Lives alone.    Has a daughter in [place] , [place] G-son   Daughter: Ricard Dillon [phone]   Social Determinants of Health   Financial  Resource Strain: Low Risk  (03/02/2023)   Overall Financial Resource Strain (CARDIA)    Difficulty of Paying Living Expenses: Not hard at all  Food Insecurity: No Food Insecurity (03/02/2023)   Hunger Vital Sign    Worried About Running Out of Food in the  Last Year: Never true    Ran Out of Food in the Last Year: Never true  Transportation Needs: No Transportation Needs (03/02/2023)   PRAPARE - Administrator, Civil Service (Medical): No    Lack of Transportation (Non-Medical): No  Physical Activity: Sufficiently Active (03/02/2023)   Exercise Vital Sign    Days of Exercise per Week: 4 days    Minutes of Exercise per Session: 90 min  Stress: No Stress Concern Present (03/02/2023)   [employer] of Occupational Health - Occupational Stress Questionnaire    Feeling of Stress : Not at all  Social Connections: Unknown (03/02/2023)   Social Connection and Isolation Panel [NHANES]    Frequency of Communication with Friends and Family: More than three times a week    Frequency of Social Gatherings with Friends and Family: More than three times a week    Attends Religious Services: Not on file    Active Member of Clubs or Organizations: Yes    Attends Banker Meetings: More than 4 times per year    Marital Status: Widowed    Tobacco Counseling Counseling given: Not Answered Tobacco comments: during college   Clinical Intake:  Pre-visit preparation completed: Yes  Pain : No/denies pain  BMI - recorded: 29.39 Nutritional Status: BMI 25 -29 Overweight Nutritional Risks: None Diabetes: No  How often do you need to have someone help you when you read instructions, pamphlets, or other written materials from your doctor or pharmacy?: 1 - Never  Interpreter Needed?: No  Information entered by :: Donne Anon, CMA   Activities of Daily Living    03/02/2023    3:08 PM 02/25/2023   11:21 AM  In your present state of health, do you have any difficulty  performing the following activities:  Hearing? 0 0  Vision? 0 0  Difficulty concentrating or making decisions? 0 0  Walking or climbing stairs? 0 0  Dressing or bathing? 0 0  Doing errands, shopping? 0 0  Preparing Food and eating ? N N  Using the Toilet? N N  In the past six months, have you accidently leaked urine? N N  Do you have problems with loss of bowel control? N N  Managing your Medications? N N  Managing your Finances? N N  Housekeeping or managing your Housekeeping? N N    Patient Care Team: Wanda Plump, MD as PCP - General (Internal Medicine) Wendall Stade, MD as PCP - Cardiology (Cardiology) Myna Hidalgo Rose Phi, MD as Consulting Physician (Oncology) Hildred Laser, MD (Inactive) as Consulting Physician (Urology)  Indicate any recent Medical Services you may have received from other than [facility] providers in the past year (date may be approximate).     Assessment:   This is a routine wellness examination for Benjamin Adkins.  Hearing/Vision screen No results found.  Dietary issues and exercise activities discussed:     Goals Addressed   None    Depression Screen    03/03/2023    9:03 AM 08/21/2022   10:09 AM 02/20/2022   10:12 AM 08/15/2021   10:06 AM 02/14/2021   11:48 AM 08/14/2020    9:27 AM 11/25/2018   11:25 AM  PHQ 2/9 Scores  PHQ - 2 Score 0 0 0 0 0 0 0    Fall Risk    03/02/2023    3:08 PM 02/25/2023   11:21 AM 08/21/2022   10:08 AM 02/20/2022   10:12 AM 08/15/2021   10:06 AM  Fall Risk   Falls in the past year? 0 0 1 0 0  Number falls in past yr: 0 0 1 0 0  Injury with Fall? 0 0 0 0 0  Risk for fall due to : No Fall Risks   No Fall Risks   Follow up Falls evaluation completed  Falls evaluation completed Falls evaluation completed Falls evaluation completed    MEDICARE RISK AT HOME:   TIMED UP AND GO:  Was the test performed?  Yes  Length of time to ambulate 10 feet: 5 sec Gait steady and fast without use of assistive device    Cognitive  Function:        03/03/2023    9:06 AM 02/20/2022   10:20 AM  6CIT Screen  What Year? 0 points 0 points  What month? 0 points 0 points  What time? 0 points 0 points  Count back from 20 0 points 0 points  Months in reverse 0 points 0 points  Repeat phrase 0 points 0 points  Total Score 0 points 0 points    Immunizations Immunization History  Administered Date(s) Administered   Fluad Quad(high Dose 65+) 03/17/2019, 05/19/2022   Influenza Split 05/27/2011, 04/02/2012   Influenza Whole 05/04/2007, 05/18/2008, 05/22/2009, 05/16/2010   Influenza, High Dose Seasonal PF 04/28/2013, 04/23/2015, 04/10/2016, 04/24/2021   Influenza,inj,Quad PF,6+ Mos 08/10/2014   Influenza-Unspecified 04/28/2017, 04/29/2018, 04/20/2020   Moderna Sars-Covid-2 Vaccination 08/01/2019, 08/29/2019, 05/17/2020   PNEUMOCOCCAL CONJUGATE-20 08/15/2021   Pfizer Covid-19 Vaccine Bivalent Booster 53yrs & up 05/15/2021   Pneumococcal Conjugate-13 04/23/2015   Pneumococcal Polysaccharide-23 08/09/2013   Tetanus 08/23/2013   Zoster Recombinant(Shingrix) 04/16/2019, 10/01/2019   Zoster, Live 07/02/2011    TDAP status: Due, Education has been provided regarding the importance of this vaccine. Advised may receive this vaccine at local pharmacy or Health Dept. Aware to provide a copy of the vaccination record if obtained from local pharmacy or Health Dept. Verbalized acceptance and understanding.  Flu Vaccine status: Due, Education has been provided regarding the importance of this vaccine. Advised may receive this vaccine at local pharmacy or Health Dept. Aware to provide a copy of the vaccination record if obtained from local pharmacy or Health Dept. Verbalized acceptance and understanding.  Pneumococcal vaccine status: Up to date  Covid-19 vaccine status: Information provided on how to obtain vaccines.   Qualifies for Shingles Vaccine? Yes   Zostavax completed Yes   Shingrix Completed?: Yes  Screening Tests Health  Maintenance  Topic Date Due   DTaP/Tdap/Td (1 - Tdap) 08/24/2013   COVID-19 Vaccine (5 - 2023-24 season) 03/21/2022   Medicare Annual Wellness (AWV)  02/21/2023   INFLUENZA VACCINE  02/19/2023   Pneumonia Vaccine 74+ Years old  Completed   Zoster Vaccines- Shingrix  Completed   HPV VACCINES  Aged Out   Hepatitis C Screening  Discontinued    Health Maintenance  Health Maintenance Due  Topic Date Due   DTaP/Tdap/Td (1 - Tdap) 08/24/2013   COVID-19 Vaccine (5 - 2023-24 season) 03/21/2022   Medicare Annual Wellness (AWV)  02/21/2023   INFLUENZA VACCINE  02/19/2023    Colorectal cancer screening: No longer required.   Lung Cancer Screening: (Low Dose CT Chest recommended if Age 72-80 years, 20 pack-year currently smoking OR have quit w/in 15years.) does not qualify.   Additional Screening:  Hepatitis C Screening: does not qualify  Vision Screening: Recommended annual ophthalmology exams for early detection of glaucoma and other disorders of the eye. Is the patient up to  date with their annual eye exam?  Yes  Who is the provider or what is the name of the office in which the patient attends annual eye exams? [facility] If pt is not established with a provider, would they like to be referred to a provider to establish care? No .   Dental Screening: Recommended annual dental exams for proper oral hygiene  Diabetic Foot Exam: N/a  Community Resource Referral / Chronic Care Management: CRR required this visit?  No   CCM required this visit?  No     Plan:     I have personally reviewed and noted the following in the patient's chart:   Medical and social history Use of alcohol, tobacco or illicit drugs  Current medications and supplements including opioid prescriptions. Patient is not currently taking opioid prescriptions. Functional ability and status Nutritional status Physical activity Advanced directives List of other physicians Hospitalizations,  surgeries, and ER visits in previous 12 months Vitals Screenings to include cognitive, depression, and falls Referrals and appointments  In addition, I have reviewed and discussed with patient certain preventive protocols, quality metrics, and best practice recommendations. A written personalized care plan for preventive services as well as general preventive health recommendations were provided to patient.     Donne Anon, CMA   03/03/2023   After Visit Summary: Sent to mychart  Nurse Notes: None

## 2023-03-17 DIAGNOSIS — G4733 Obstructive sleep apnea (adult) (pediatric): Secondary | ICD-10-CM | POA: Diagnosis not present

## 2023-03-21 ENCOUNTER — Other Ambulatory Visit: Payer: Self-pay | Admitting: Cardiovascular Disease

## 2023-04-17 ENCOUNTER — Other Ambulatory Visit: Payer: Self-pay | Admitting: Cardiovascular Disease

## 2023-04-25 ENCOUNTER — Other Ambulatory Visit: Payer: Self-pay | Admitting: Cardiovascular Disease

## 2023-04-30 NOTE — Progress Notes (Signed)
Patient ID: Benjamin Adkins, male   DOB: [birth year], 82 y.o.   MRN: [id number]     This is a 82 y.o.  white male patient f/U CAD  In 2005 he had a perioperative MI after left knee replacement. He required urgent stenting to the RCA and subsequent CABG. He did have a negative treadmill prior to his knee surgery in 2005. He has done well since that time. He has a history of a right bundle branch block.his lipids are controlled. He has no history of diabetes and does not smoke.   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  All other systems reviewed and negative  General: BP 126/78   Pulse 70   Ht 5\' 9"  (1.753 m)   Wt 197 lb (89.4 kg)   SpO2 96%   BMI 29.09 kg/m  Affect appropriate Healthy:  appears stated age HEENT: normal Neck supple with no adenopathy JVP normal no bruits no thyromegaly Lungs clear with no wheezing and good diaphragmatic motion Heart:  S1/S2 no murmur, no rub, gallop or click PMI normal Abdomen: benighn, BS positve, no tenderness, no AAA no bruit.  No HSM or HJR Distal pulses intact with no bruits No edema Neuro non-focal Skin warm and dry No muscular weakness Post bilateral TKR;s     Current Outpatient Medications  Medication Sig  Dispense Refill   amoxicillin (AMOXIL) 500 MG capsule TAKE 4 CAPSULES BY MOUTH AS NEEDED FOR DENTAL PROCEDURE 20 capsule 0   aspirin 325 MG EC tablet Take 325 mg by mouth daily.     atorvastatin (LIPITOR) 40 MG tablet TAKE 1 TABLET BY MOUTH EVERY DAY 30 tablet 0   azelastine (ASTELIN) 0.1 % nasal spray Place 2 sprays into both nostrils 2 (two) times daily. Use in each nostril as directed 30 mL 12   celecoxib (CELEBREX) 100 MG capsule Take 1 capsule (100 mg total) by mouth 2 (two) times daily as needed for moderate pain. 180 capsule 1   cetirizine (ZYRTEC) 10 MG tablet Take 10 mg by mouth daily.     ezetimibe (ZETIA) 10 MG tablet Take 1 tablet (10 mg total) by mouth daily. 90 tablet 1   metoprolol succinate (TOPROL-XL) 25 MG 24 hr tablet TAKE 1 TABLET (25 MG TOTAL) BY MOUTH DAILY. PATIENT HAS TO KEEP UPCOMING APPOINTMENT FOR ANY FUTURE REFILLS. 90 tablet 0   Multiple Vitamin (MULTIVITAMIN) capsule Take 1 capsule by mouth daily.     nitroGLYCERIN (NITROSTAT) 0.4 MG SL tablet Place 1 tablet (0.4 mg total) under the tongue every 5 (five) minutes as needed. 25 tablet 3   omeprazole (PRILOSEC) 20 MG capsule TAKE 1 CAPSULE BY MOUTH EVERY DAY BEFORE BREAKFAST 90 capsule 3   No  current facility-administered medications for this visit.    Allergies  Patient has no known allergies.  Electrocardiogram:  05/12/2023   SR rate  RBBB LAFB old ILMI  Assessment and Plan CAD:  Stable with no angina and good activity level.  Continue medical Rx last myovue 2013 old IMI no ischemia  He is active and does not want stress test as he had normal one right before his MI No indication for cath at this time   Chol:  On statin LDL at goal 58 08/21/22  on lipitor 40 mg and zetia 10 mg labs with primary   GERD:  On prilosec discussed low carb diet   RBBB: chronic no high grade AV block yearly ECG   PSA:  Chronically elevated 4.09 08/15/21  will update for primary   OSA:  wears CPAP recently received new machine F/U  Dr Wynona Neat  PSA  F/U with me in a year  Charlton Haws

## 2023-05-01 ENCOUNTER — Ambulatory Visit: Payer: Medicare PPO | Admitting: Cardiovascular Disease

## 2023-05-12 ENCOUNTER — Ambulatory Visit: Payer: Medicare PPO | Attending: Cardiovascular Disease | Admitting: Cardiovascular Disease

## 2023-05-12 ENCOUNTER — Encounter: Payer: Self-pay | Admitting: Cardiovascular Disease

## 2023-05-12 VITALS — BP 126/78 | HR 70 | Ht 69.0 in | Wt 197.0 lb

## 2023-05-12 DIAGNOSIS — I10 Essential (primary) hypertension: Secondary | ICD-10-CM | POA: Diagnosis not present

## 2023-05-12 DIAGNOSIS — R972 Elevated prostate specific antigen [PSA]: Secondary | ICD-10-CM

## 2023-05-12 DIAGNOSIS — I25810 Atherosclerosis of coronary artery bypass graft(s) without angina pectoris: Secondary | ICD-10-CM | POA: Diagnosis not present

## 2023-05-12 NOTE — Patient Instructions (Signed)
Medication Instructions:  Your physician recommends that you continue on your current medications as directed. Please refer to the Current Medication list given to you today.  *If you need a refill on your cardiac medications before your next appointment, please call your pharmacy*   Lab Work: Your physician recommends that you have lab work today- PSA  If you have labs (blood work) drawn today and your tests are completely normal, you will receive your results only by: MyChart Message (if you have MyChart) OR A paper copy in the mail If you have any lab test that is abnormal or we need to change your treatment, we will call you to review the results.  Follow-Up: At [facility], you and your health needs are our priority.  As part of our continuing mission to provide you with exceptional heart care, we have created designated Provider Care Teams.  These Care Teams include your primary Cardiologist (physician) and Advanced Practice Providers (APPs -  Physician Assistants and Nurse Practitioners) who all work together to provide you with the care you need, when you need it.  We recommend signing up for the patient portal called "MyChart".  Sign up information is provided on this After Visit Summary.  MyChart is used to connect with patients for Virtual Visits (Telemedicine).  Patients are able to view lab/test results, encounter notes, upcoming appointments, etc.  Non-urgent messages can be sent to your provider as well.   To learn more about what you can do with MyChart, go to [email or web address].    Your next appointment:   1 year(s)  Provider:   Charlton Haws, MD

## 2023-05-13 ENCOUNTER — Encounter: Payer: Self-pay | Admitting: Internal Medicine

## 2023-05-13 LAB — PSA: Prostate Specific Ag, Serum: 5.7 ng/mL — ABNORMAL HIGH (ref 0.0–4.0)

## 2023-05-19 ENCOUNTER — Other Ambulatory Visit: Payer: Self-pay | Admitting: Cardiovascular Disease

## 2023-05-28 ENCOUNTER — Telehealth: Payer: Self-pay | Admitting: Internal Medicine

## 2023-05-28 ENCOUNTER — Telehealth: Payer: Self-pay | Admitting: Gastroenterology

## 2023-05-28 DIAGNOSIS — Z1211 Encounter for screening for malignant neoplasm of colon: Secondary | ICD-10-CM

## 2023-05-28 NOTE — Telephone Encounter (Signed)
Pt called stating that he would like to look into getting a colonoscopy after thinking about it.

## 2023-05-28 NOTE — Telephone Encounter (Signed)
Good afternoon Dr. Adela Lank  The following patient is calling in to schedule a colonoscopy. He had one 5 years ago and a polyp was found. He has been advised by his PCP to schedule one. He is 82 y.o. Do you recommend me to schedule directly or would you rather see him in office. Please advise.

## 2023-05-28 NOTE — Telephone Encounter (Signed)
GI referral placed

## 2023-05-28 NOTE — Addendum Note (Signed)
Addended byConrad Hiawatha D on: 05/28/2023 03:12 PM   Modules accepted: Orders

## 2023-05-29 NOTE — Telephone Encounter (Signed)
Recommend office visit.  Thanks.

## 2023-06-01 ENCOUNTER — Encounter: Payer: Self-pay | Admitting: Gastroenterology

## 2023-06-01 NOTE — Telephone Encounter (Signed)
PT is scheduled for 2/26

## 2023-06-05 ENCOUNTER — Other Ambulatory Visit: Payer: Self-pay | Admitting: Internal Medicine

## 2023-07-12 ENCOUNTER — Other Ambulatory Visit: Payer: Self-pay | Admitting: Internal Medicine

## 2023-07-14 ENCOUNTER — Other Ambulatory Visit: Payer: Self-pay | Admitting: Cardiovascular Disease

## 2023-08-25 ENCOUNTER — Encounter: Payer: Self-pay | Admitting: Internal Medicine

## 2023-08-25 ENCOUNTER — Ambulatory Visit (INDEPENDENT_AMBULATORY_CARE_PROVIDER_SITE_OTHER): Payer: Medicare PPO | Admitting: Internal Medicine

## 2023-08-25 VITALS — BP 122/60 | HR 67 | Temp 98.0°F | Resp 18 | Ht 69.0 in | Wt 203.5 lb

## 2023-08-25 DIAGNOSIS — Z0000 Encounter for general adult medical examination without abnormal findings: Secondary | ICD-10-CM

## 2023-08-25 DIAGNOSIS — E785 Hyperlipidemia, unspecified: Secondary | ICD-10-CM

## 2023-08-25 DIAGNOSIS — R972 Elevated prostate specific antigen [PSA]: Secondary | ICD-10-CM

## 2023-08-25 DIAGNOSIS — I10 Essential (primary) hypertension: Secondary | ICD-10-CM | POA: Diagnosis not present

## 2023-08-25 DIAGNOSIS — Z0001 Encounter for general adult medical examination with abnormal findings: Secondary | ICD-10-CM

## 2023-08-25 DIAGNOSIS — I25810 Atherosclerosis of coronary artery bypass graft(s) without angina pectoris: Secondary | ICD-10-CM

## 2023-08-25 DIAGNOSIS — R739 Hyperglycemia, unspecified: Secondary | ICD-10-CM

## 2023-08-25 LAB — CBC WITH DIFFERENTIAL/PLATELET
Basophils Absolute: 0 10*3/uL (ref 0.0–0.1)
Basophils Relative: 0.4 % (ref 0.0–3.0)
Eosinophils Absolute: 0.1 10*3/uL (ref 0.0–0.7)
Eosinophils Relative: 1.4 % (ref 0.0–5.0)
HCT: 41.4 % (ref 39.0–52.0)
Hemoglobin: 14.3 g/dL (ref 13.0–17.0)
Lymphocytes Relative: 21.9 % (ref 12.0–46.0)
Lymphs Abs: 1.6 10*3/uL (ref 0.7–4.0)
MCHC: 34.5 g/dL (ref 30.0–36.0)
MCV: 89.4 fL (ref 78.0–100.0)
Monocytes Absolute: 0.7 10*3/uL (ref 0.1–1.0)
Monocytes Relative: 9.8 % (ref 3.0–12.0)
Neutro Abs: 4.9 10*3/uL (ref 1.4–7.7)
Neutrophils Relative %: 66.5 % (ref 43.0–77.0)
Platelets: 173 10*3/uL (ref 150.0–400.0)
RBC: 4.63 Mil/uL (ref 4.22–5.81)
RDW: 13.9 % (ref 11.5–15.5)
WBC: 7.3 10*3/uL (ref 4.0–10.5)

## 2023-08-25 LAB — LIPID PANEL
Cholesterol: 108 mg/dL (ref 0–200)
HDL: 42.5 mg/dL (ref >39.00)
LDL Cholesterol: 50 mg/dL (ref 0–99)
NonHDL: 65.96
Total CHOL/HDL Ratio: 3
Triglycerides: 81 mg/dL (ref 0.0–149.0)
VLDL: 16.2 mg/dL (ref 0.0–40.0)

## 2023-08-25 LAB — COMPREHENSIVE METABOLIC PANEL
ALT: 20 U/L (ref 0–53)
AST: 29 U/L (ref 0–37)
Albumin: 4.1 g/dL (ref 3.5–5.2)
Alkaline Phosphatase: 98 U/L (ref 39–117)
BUN: 14 mg/dL (ref 6–23)
CO2: 27 meq/L (ref 19–32)
Calcium: 8.9 mg/dL (ref 8.4–10.5)
Chloride: 103 meq/L (ref 96–112)
Creatinine, Ser: 0.93 mg/dL (ref 0.40–1.50)
GFR: 76.58 mL/min (ref >60.00)
Glucose, Bld: 100 mg/dL — ABNORMAL HIGH (ref 70–99)
Potassium: 4.1 meq/L (ref 3.5–5.1)
Sodium: 137 meq/L (ref 135–145)
Total Bilirubin: 0.9 mg/dL (ref 0.2–1.2)
Total Protein: 6.6 g/dL (ref 6.0–8.3)

## 2023-08-25 NOTE — Assessment & Plan Note (Signed)
 Here for CPX Other issues addressed today: Prediabetes, last A1c very good. HTN: Reports ambulatory BPs are normal, continue metoprolol . Hyperlipidemia: On atorvastatin  40 mg and Zetia .  Check FLP. CAD: Denies chest pain. DJD: Takes NSAIDs sporadically before playing golf, encouraged to take with food. History of melanoma, due to see dermatology, encouraged to call and set up. Increased PSA saw urology 08/2019, DRE was wnl.  Never had a biopsy. PSA  2019: 5.4,  08-2019: 4.08; 07-2020: 3.78; 07-2021: 4.09.  04-2023: 5.7 PSA was checked by cardiology in October 2024, slightly higher than before, at this point we agreed to have an office visit in 3 to 4 months for DRE and PSA --although he is not interested on aggressive treatment or evaluation, he understand that if he has prostate cancer it would be a slow/indolent condition RTC 3 to 4 months

## 2023-08-25 NOTE — Assessment & Plan Note (Signed)
 Here for CPX - Tdap: 2025 - - PNA 23: 07/2013 ; prevnar:04-2015; PNM 20: 07-2021 - s/p  zostavax:06/2011; Shingrix x2 - Had a Flu shot and a COVID-vaccine recently - Recommend RSV -CCS:  cscope 02/2006 Diverticulosis cscope 04-2016- 1 polyp, next was due 04/2021, will see GI soon to discuss CCS -prostate ca screening/elevated PSA: See comments under elevated PSA. -labs :CMP FLP CBC -Diet and exercise: Doing well, playing golf. - ACP information on file

## 2023-08-25 NOTE — Progress Notes (Signed)
 Subjective:    Patient ID: Benjamin Adkins, male    DOB: 04/01/1941, 83 y.o.   MRN: [id number]  DOS:  08/25/2023 Type of visit - description: cpx  Here for CPX. Chronic medical problems were addressed. Takes occasional NSAIDs before playing golf. Ambulatory BPs are normal. Denies chest pain or difficulty breathing. No nausea vomiting.  No blood in the stools. No LUTS.    Review of Systems  Other than above, a 14 point review of systems is negative      Past Medical History:  Diagnosis Date   Coronary atherosclerosis of unspecified type of vessel, native or graft    MI 2006, h/o CABG   DJD (degenerative joint disease)    Elevated prostate specific antigen (PSA)    Elevated PSA    Esophageal reflux    HTN (hypertension)    Hyperlipidemia    nmr lipoprofile 2004: LDL 154 (1653/711), HDL 42, TG 80.NMR  LDL =,125, but PMH  of MI LDL goal=,70   Hypertension    Myocardial infarction [facility])    2006   Other cataract    Personal history of colonic polyps    Shortness of breath    DOE   Sleep apnea    uses CPAP    Past Surgical History:  Procedure Laterality Date   CARDIAC CATHETERIZATION     2006   CATARACT EXTRACTION Bilateral    COLONOSCOPY W/ POLYPECTOMY     CORONARY ARTERY BYPASS GRAFT  2006   X 4    EYE SURGERY     HERNIA REPAIR     VENTRAL, LEFT & RIGHT GROIN   INGUINAL HERNIA REPAIR     JOINT REPLACEMENT     TOTAL KNEE ARTHROPLASTY  2005   TOTAL KNEE ARTHROPLASTY  2006   TOTAL KNEE ARTHROPLASTY  08/25/2011   Procedure: TOTAL KNEE ARTHROPLASTY;  Surgeon: Lamar DELENA Millman, MD;  Location: [facility] OR;  Service: Orthopedics;  Laterality: Right;  DR MILLMAN CHRISTIAN 90 MINUTES FOR SURGERY   Social History   Socioeconomic History   Marital status: Widowed    Spouse name: Not on file   Number of children: 1   Years of education: Not on file   Highest education level: Not on file  Occupational History   Occupation: retired  Tobacco Use   Smoking status: Former     Current packs/day: 0.00    Average packs/day: 0.3 packs/day for 3.0 years (0.8 ttl pk-yrs)    Types: Cigarettes    Start date: 04/12/1959    Quit date: 08/12/1961    Years since quitting: 62.0   Smokeless tobacco: Never   Tobacco comments:    during college  Vaping Use   Vaping status: Never Used  Substance and Sexual Activity   Alcohol use: Yes    Alcohol/week: 6.0 standard drinks of alcohol    Types: 2 Glasses of wine, 4 Cans of beer per week    Comment: Social   Drug use: No   Sexual activity: Yes  Other Topics Concern   Not on file  Social History Narrative   Lives alone.    Has a daughter in [place] , [place] G-[place]   Daughter: [name] [phone]   Social Drivers of Health   Financial Resource Strain: Low Risk  (03/02/2023)   Overall Financial Resource Strain (CARDIA)    Difficulty of Paying Living Expenses: Not hard at all  Food Insecurity: No Food Insecurity (03/02/2023)   Hunger Vital Sign  Worried About Programme Researcher, Broadcasting/film/video in the Last Year: Never true    Ran Out of Food in the Last Year: Never true  Transportation Needs: No Transportation Needs (03/02/2023)   PRAPARE - Administrator, Civil Service (Medical): No    Lack of Transportation (Non-Medical): No  Physical Activity: Sufficiently Active (03/02/2023)   Exercise Vital Sign    Days of Exercise per Week: 4 days    Minutes of Exercise per Session: 90 min  Stress: No Stress Concern Present (03/02/2023)   [employer] of Occupational Health - Occupational Stress Questionnaire    Feeling of Stress : Not at all  Social Connections: Unknown (03/02/2023)   Social Connection and Isolation Panel [NHANES]    Frequency of Communication with Friends and Family: More than three times a week    Frequency of Social Gatherings with Friends and Family: More than three times a week    Attends Religious Services: Not on file    Active Member of Clubs or Organizations: Yes    Attends Banker Meetings:  More than 4 times per year    Marital Status: Widowed  Intimate Partner Violence: Not At Risk (03/03/2023)   Humiliation, Afraid, Rape, and Kick questionnaire    Fear of Current or Ex-Partner: No    Emotionally Abused: No    Physically Abused: No    Sexually Abused: No     Current Outpatient Medications  Medication Instructions   amoxicillin  (AMOXIL ) 500 MG capsule TAKE 4 CAPSULES BY MOUTH AS NEEDED FOR DENTAL PROCEDURE   aspirin  EC 325 mg, Daily   atorvastatin  (LIPITOR) 40 mg, Oral, Daily   azelastine  (ASTELIN ) 0.1 % nasal spray 2 sprays, Each Nare, 2 times daily, Use in each nostril as directed   celecoxib  (CELEBREX ) 100 mg, Oral, 2 times daily PRN   cetirizine (ZYRTEC) 10 mg, Daily   ezetimibe  (ZETIA ) 10 mg, Oral, Daily   metoprolol  succinate (TOPROL -XL) 25 mg, Oral, Daily   Multiple Vitamin (MULTIVITAMIN) capsule 1 capsule, Daily   nitroGLYCERIN  (NITROSTAT ) 0.4 mg, Sublingual, Every 5 min PRN   omeprazole  (PRILOSEC) 20 mg, Oral, Daily before breakfast       Objective:   Physical Exam BP 122/60   Pulse 67   Temp 98 F (36.7 C) (Oral)   Resp 18   Ht 5' 9 (1.753 m)   Wt 203 lb 8 oz (92.3 kg)   SpO2 94%   BMI 30.05 kg/m  General: Well developed, NAD, BMI noted Neck: No  thyromegaly  HEENT:  Normocephalic . Face symmetric, atraumatic Lungs:  CTA B Normal respiratory effort, no intercostal retractions, no accessory muscle use. Heart: RRR,  no murmur.  Abdomen:  Not distended, soft, non-tender. No rebound or rigidity.   Lower extremities: no pretibial edema bilaterally  Skin: Exposed areas without rash. Not pale. Not jaundice Neurologic:  alert & oriented X3.  Speech normal, gait appropriate for age and unassisted Strength symmetric and appropriate for age.  Psych: Cognition and judgment appear intact.  Cooperative with normal attention span and concentration.  Behavior appropriate. No anxious or depressed appearing.     Assessment     Assessment   Prediabetes HTN Hyperlipidemia CAD, MI 2006, CABG, RBBB OSA , on CPAP DJD Knee replacement-- on abx pre-dental procedures and: BP today is very good, continue Elevated PSA Melanoma : L ear, removed  2022  PLAN: Here for CPX - Tdap: 2025 - - PNA 23: 07/2013 ; prevnar:04-2015; PNM 20: 07-2021 -  s/p  zostavax:06/2011; Shingrix x2 - Had a Flu shot and a COVID-vaccine recently - Recommend RSV -CCS:  cscope 02/2006 Diverticulosis cscope 04-2016- 1 polyp, next was due 04/2021, will see GI soon to discuss CCS -prostate ca screening/elevated PSA: See comments under elevated PSA. -labs :CMP FLP CBC -Diet and exercise: Doing well, playing golf. - ACP information on file  Other issues addressed today: Prediabetes, last A1c very good. HTN: Reports ambulatory BPs are normal, continue metoprolol . Hyperlipidemia: On atorvastatin  40 mg and Zetia .  Check FLP. CAD: Denies chest pain. DJD: Takes NSAIDs sporadically before playing golf, encouraged to take with food. History of melanoma, due to see dermatology, encouraged to call and set up. Increased PSA saw urology 08/2019, DRE was wnl.  Never had a biopsy. PSA  2019: 5.4,  08-2019: 4.08; 07-2020: 3.78; 07-2021: 4.09.  04-2023: 5.7 PSA was checked by cardiology in October 2024, slightly higher than before, at this point we agreed to have an office visit in 3 to 4 months for DRE and PSA --although he is not interested on aggressive treatment or evaluation, he understand that if he has prostate cancer it would be a slow/indolent condition RTC 3 to 4 months

## 2023-08-25 NOTE — Patient Instructions (Addendum)
 Vaccines I recommend: RSV   Check the  blood pressure regularly Blood pressure goal:  between 110/65 and  135/85. If it is consistently higher or lower, let me know  Please call your dermatologist and get a checkup.  When you take Celebrex  or Advil: Always doing with a full stomach.    GO TO THE LAB : Get the blood work     Next visit with me in approximately 3 to 4 months Please schedule it at the front desk

## 2023-08-26 ENCOUNTER — Encounter: Payer: Self-pay | Admitting: Internal Medicine

## 2023-09-16 ENCOUNTER — Ambulatory Visit: Payer: Medicare PPO | Admitting: Gastroenterology

## 2023-09-16 ENCOUNTER — Encounter: Payer: Self-pay | Admitting: Gastroenterology

## 2023-09-16 VITALS — BP 138/70 | HR 64 | Ht 69.0 in | Wt 200.0 lb

## 2023-09-16 DIAGNOSIS — Z09 Encounter for follow-up examination after completed treatment for conditions other than malignant neoplasm: Secondary | ICD-10-CM | POA: Diagnosis not present

## 2023-09-16 DIAGNOSIS — Z860101 Personal history of adenomatous and serrated colon polyps: Secondary | ICD-10-CM

## 2023-09-16 DIAGNOSIS — Z860100 Personal history of colon polyps, unspecified: Secondary | ICD-10-CM

## 2023-09-16 NOTE — Progress Notes (Signed)
 HPI :  83 year old male history of CABG in 2006, history of colon polyps, OSA, here to reestablish care to discuss possible surveillance colonoscopy.  I performed his colonoscopy in September 2017 and he had a 7 mm cecal adenoma that was removed.  No other polyps noted.  He did have diverticulosis and hemorrhoids as well.  He denies any problems with his bowels since that time.  No blood in his stools.  No abdominal pains that bother him on a routine basis.  He is eating well without digestive complaints.  He denies any cardiopulmonary symptoms following his CABG.  He follows yearly with his cardiologist.  He is very active with doing yard work, goes to [employer] routinely, [employer]  General he denies any complaints today and feels well.  He questions if he needs another colonoscopy, he is aware of risks with the procedure and here for discussion if he should proceed with further colonoscopy exams or not.  He has no family history of colon cancer.  No anemia.  Has not had issues with anesthesia in the past.   Colonoscopy 03/2016:  - The perianal and digital rectal examinations were normal. - A 7 mm polyp was found in the cecum. The polyp was sessile. The polyp was removed with a cold snare. Resection and retrieval were complete. - Many medium-mouthed diverticula were found in the transverse colon and left colon. - Non-bleeding internal hemorrhoids were found during retroflexion. - The exam was otherwise without abnormality.   Surgical [P], Cecum - TUBULAR ADENOMA (X3 FRAGMENTS). - NO HIGH GRADE DYSPLASIA OR MALIGNANCY.     Past Medical History:  Diagnosis Date   Arthritis    Coronary atherosclerosis of unspecified type of vessel, native or graft    MI 2006, h/o CABG   DJD (degenerative joint disease)    Elevated prostate specific antigen (PSA)    Elevated PSA    Esophageal reflux    GERD (gastroesophageal reflux disease)    HTN (hypertension)    Hyperlipidemia    nmr lipoprofile  2004: LDL 154 (1653/711), HDL 42, TG 80.NMR  LDL =,125, but PMH  of MI LDL goal=,70   Hypertension    Myocardial infarction [facility])    2006   Other cataract    Personal history of colonic polyps    Shortness of breath    DOE   Sleep apnea    uses CPAP     Past Surgical History:  Procedure Laterality Date   CARDIAC CATHETERIZATION     2006   CATARACT EXTRACTION Bilateral    COLONOSCOPY W/ POLYPECTOMY     CORONARY ARTERY BYPASS GRAFT  2006   X 4    EYE SURGERY     HERNIA REPAIR     VENTRAL, LEFT & RIGHT GROIN   TOTAL KNEE ARTHROPLASTY  2006   TOTAL KNEE ARTHROPLASTY  08/25/2011   Procedure: TOTAL KNEE ARTHROPLASTY;  Surgeon: Nilda Simmer, MD;  Location: [facility] OR;  Service: Orthopedics;  Laterality: Right;  DR Anne Ng 90 MINUTES FOR SURGERY   Family History  Problem Relation Age of Onset   Hypertension Mother    Breast cancer Mother    Coronary artery disease Mother    Heart disease Mother    Arthritis Mother    Cancer Mother    Stomach cancer Father    Breast cancer Sister    Crohn's disease Sister    Arthritis Sister    Asthma Sister  childhood   Arthritis Brother    Heart disease Brother    Heart attack Paternal Grandfather 92   Colon cancer Neg Hx    Prostate cancer Neg Hx    Social History   Tobacco Use   Smoking status: Former    Current packs/day: 0.00    Average packs/day: 0.3 packs/day for 3.0 years (0.8 ttl pk-yrs)    Types: Cigarettes    Start date: 04/12/1959    Quit date: 08/12/1961    Years since quitting: 62.1   Smokeless tobacco: Never   Tobacco comments:    during college  Vaping Use   Vaping status: Never Used  Substance Use Topics   Alcohol use: Yes    Alcohol/week: 1.0 standard drink of alcohol    Types: 1 Glasses of wine per week    Comment: with dinner occ   Drug use: No   Current Outpatient Medications  Medication Sig Dispense Refill   amoxicillin (AMOXIL) 500 MG capsule TAKE 4 CAPSULES BY MOUTH AS NEEDED FOR DENTAL  PROCEDURE 20 capsule 0   aspirin 325 MG EC tablet Take 325 mg by mouth daily.     atorvastatin (LIPITOR) 40 MG tablet Take 1 tablet (40 mg total) by mouth daily. 90 tablet 3   azelastine (ASTELIN) 0.1 % nasal spray Place 2 sprays into both nostrils 2 (two) times daily. Use in each nostril as directed 30 mL 12   celecoxib (CELEBREX) 100 MG capsule Take 1 capsule (100 mg total) by mouth 2 (two) times daily as needed for moderate pain. 180 capsule 1   cetirizine (ZYRTEC) 10 MG tablet Take 10 mg by mouth daily.     ezetimibe (ZETIA) 10 MG tablet Take 1 tablet (10 mg total) by mouth daily. 90 tablet 1   metoprolol succinate (TOPROL-XL) 25 MG 24 hr tablet Take 1 tablet (25 mg total) by mouth daily. 90 tablet 2   Multiple Vitamin (MULTIVITAMIN) capsule Take 1 capsule by mouth daily.     nitroGLYCERIN (NITROSTAT) 0.4 MG SL tablet Place 1 tablet (0.4 mg total) under the tongue every 5 (five) minutes as needed. 25 tablet 3   omeprazole (PRILOSEC) 20 MG capsule Take 1 capsule (20 mg total) by mouth daily before breakfast. 90 capsule 1   No current facility-administered medications for this visit.   Allergies  Allergen Reactions   Short Ragweed Pollen Ext      Review of Systems: All systems reviewed and negative except where noted in HPI.   Lab Results  Component Value Date   WBC 7.3 08/25/2023   HGB 14.3 08/25/2023   HCT 41.4 08/25/2023   MCV 89.4 08/25/2023   PLT 173.0 08/25/2023    Lab Results  Component Value Date   NA 137 08/25/2023   CL 103 08/25/2023   K 4.1 08/25/2023   CO2 27 08/25/2023   BUN 14 08/25/2023   CREATININE 0.93 08/25/2023   GFR 76.58 08/25/2023   CALCIUM 8.9 08/25/2023   ALBUMIN 4.1 08/25/2023   GLUCOSE 100 (H) 08/25/2023    Lab Results  Component Value Date   ALT 20 08/25/2023   AST 29 08/25/2023   ALKPHOS 98 08/25/2023   BILITOT 0.9 08/25/2023     Physical Exam: BP 138/70   Pulse 64   Ht 5\' 9"  (1.753 m)   Wt 200 lb (90.7 kg)   BMI 29.53 kg/m   Constitutional: Pleasant,well-developed, male in no acute distress. Neurological: Alert and oriented to person place and time. Psychiatric: Normal  mood and affect. Behavior is normal.   ASSESSMENT: 83 y.o. male here for assessment of the following  1. History of colon polyps    We had a good discussion about his history of colon polyps (nonadvanced adenoma) and if he should proceed with further surveillance colonoscopy or not.  We discussed what the procedure is, risks of anesthesia and the exam.  We discussed that most patients stop routine screening around age 18, sometimes we do it later in life based on how many polyps, size, type etc.  He had a nonadvanced polyp back in 2017.  His colon cancer risk should remain low whether or not he proceeds with colonoscopy at this point in his life.  We discussed that the risks of colonoscopy can increase as we age, especially into his 16s.  I do not feel strongly that he needs another colonoscopy at this point in his life, however he is otherwise quite healthy and if he wanted to pursue 1 final exam I offered him that.  We discussed this for a bit, risks of doing it versus risks of not doing it and benefits.  Following this discussion he feels more comfortable holding off on further colonoscopy.  Should he develop any symptoms moving forward he will let me know.  If he changes his mind over time and does wish to proceed with an exam at some point he will let me know.  All questions answered, he agreed with the plan.  Harlin Rain, MD [facility]  CC: Wanda Plump, MD

## 2023-09-22 ENCOUNTER — Ambulatory Visit: Payer: Self-pay | Admitting: Internal Medicine

## 2023-09-22 ENCOUNTER — Other Ambulatory Visit: Payer: Self-pay | Admitting: Internal Medicine

## 2023-09-22 ENCOUNTER — Ambulatory Visit: Admitting: Internal Medicine

## 2023-09-22 VITALS — BP 136/82 | HR 68 | Temp 98.0°F | Resp 16 | Ht 69.0 in | Wt 200.0 lb

## 2023-09-22 DIAGNOSIS — K625 Hemorrhage of anus and rectum: Secondary | ICD-10-CM

## 2023-09-22 MED ORDER — HYDROCORTISONE ACETATE 25 MG RE SUPP: 25.0000 mg | Freq: Two times a day (BID) | RECTAL | 1 refills | Status: AC | PRN

## 2023-09-22 NOTE — Patient Instructions (Addendum)
 Drink plenty fluids  Avoid straining  Avoid Celebrex for the next week  Aspirin 325 mg okay.  Use Anusol suppository twice daily as needed  If you have severe bleeding, abdominal pain, nausea vomiting: Seek medical attention.  If you continue having minor bleeding on and off: Let me know.

## 2023-09-22 NOTE — Telephone Encounter (Signed)
 Copied from CRM [id number]. Topic: Clinical - Medication Refill >> Sep 22, 2023  9:44 AM Corin V wrote: Most Recent Primary Care Visit:  Provider: Willow Ora E  Department: [facility]  Visit Type: PHYSICAL  Date: 08/25/2023  Medication: hydrocortisone (ANUSOL-HC) 25 MG suppository  Has the patient contacted their pharmacy? No (Agent: If no, request that the patient contact the pharmacy for the refill. If patient does not wish to contact the pharmacy document the reason why and proceed with request.) (Agent: If yes, when and what did the pharmacy advise?)  Is this the correct pharmacy for this prescription? Yes If no, delete pharmacy and type the correct one.  This is the patient's preferred pharmacy:  [id number] [place], [place] - [address] [place] [address] Phone: [phone] Fax: [phone]   Has the prescription been filled recently? No  Is the patient out of the medication? Yes  Has the patient been seen for an appointment in the last year OR does the patient have an upcoming appointment? Yes  Can we respond through MyChart? No  Agent: Please be advised that Rx refills may take up to 3 business days. We ask that you follow-up with your pharmacy.

## 2023-09-22 NOTE — Telephone Encounter (Signed)
  Chief Complaint: rectal bleeding Symptoms: rectal bleeding Frequency: 1x today Pertinent Negatives: Patient denies pain, fever, weakness, dizziness Disposition: [] ED /[] Urgent Care (no appt availability in office) / [x] Appointment(In office/virtual)/ []  [facility]/ [] Home Care/ [] Refused Recommended Disposition /[] [facility]/ []  Follow-up with PCP Additional Notes: Pt reports one episode of rectal bleeding today. Pt states this happened 7 years ago and he was prescribed hydrocortisone suppositories that were very effective. Pt asking for the same medication prescribed to him today since the suppositories he does have are 83 years old. Pt states he had diarrhea over the weekend and was straining this AM to have a BM. Pt states rectal bleeding 7 years ago began with straining as well. Hx of hemorrhoids. Pt states the blood turned the toilet water red. No blood mixed in with stool. No clots. RN advised pt he should be seen within 24 hours. RN scheduled pt for 1:20 today in the office.  In the meantime, pt would like hydrocortisone suppository prescribed to him again so he can take one. RN advised pt normally HCPs do not prescribe medications without seeing the patient, hence why RN made an appt for the pt. Pt verbalized understanding, but still states he would like that suppository prescribed to him as soon as it can. RN advised pt he should call us back before his appt if he loses a large amount of blood with clots, or for anything new/concerning, to which he verbalized understanding.   Copied from CRM [id number]. Topic: Clinical - Red Word Triage >> Sep 22, 2023  9:46 AM Corin V wrote: [facility] that prompted transfer to Nurse Triage: Patient has been having bleeding on and off from hemroids after a cold this weekend. Reason for Disposition  MODERATE rectal bleeding (small blood clots, passing blood without stool, or toilet water turns red)  Answer Assessment - Initial Assessment  Questions 1. APPEARANCE of BLOOD: "What color is it?" "Is it passed separately, on the surface of the stool, or mixed in with the stool?"      Blood in the stool 2. AMOUNT: "How much blood was passed?"      "Turned the water red", hardly "anything there", had diarrhea over the weekend, states he was straining today 3. FREQUENCY: "How many times has blood been passed with the stools?"      Once 4. ONSET: "When was the blood first seen in the stools?" (Days or weeks)      Today, had diarrhea on Saturday, just once. Suppositories from 7 years ago are expired. 5. DIARRHEA: "Is there also some diarrhea?" If Yes, ask: "How many diarrhea stools in the past 24 hours?"      Diarrhea over the weekend 6. CONSTIPATION: "Do you have constipation?" If Yes, ask: "How bad is it?"     Minor BM today 7. RECURRENT SYMPTOMS: "Have you had blood in your stools before?" If Yes, ask: "When was the last time?" and "What happened that time?"      This happened 7 years ago 8. BLOOD THINNERS: "Do you take any blood thinners?" (e.g., Coumadin/warfarin, Pradaxa/dabigatran, aspirin)     Just an aspirin 9. OTHER SYMPTOMS: "Do you have any other symptoms?"  (e.g., abdomen pain, vomiting, dizziness, fever) No abdominal pain, no vomiting, no dizziness, no fever, no clots  Protocols used: Rectal Bleeding-A-AH

## 2023-09-22 NOTE — Progress Notes (Unsigned)
 Subjective:    Patient ID: Benjamin Adkins, male    DOB: 09-13-1940, 83 y.o.   MRN: [id number]  DOS:  09/22/2023 Type of visit - description: Acute On 09/19/2023, he had acute diarrhea, several episodes of abundant watery stools. That resolved within 24 hours. No associated fever, nausea or vomiting.  He did not have BMs for 2 days.  For that reason he decided to "force the issue" went to the bathroom. After some straining, she saw small amount of normal stools when he looked, he saw red bright red in the bowl.  Denies rectal pain.    Review of Systems See above   Past Medical History:  Diagnosis Date   Arthritis    Coronary atherosclerosis of unspecified type of vessel, native or graft    MI 2006, h/o CABG   DJD (degenerative joint disease)    Elevated prostate specific antigen (PSA)    Elevated PSA    Esophageal reflux    GERD (gastroesophageal reflux disease)    HTN (hypertension)    Hyperlipidemia    nmr lipoprofile 2004: LDL 154 (1653/711), HDL 42, TG 80.NMR  LDL =,125, but PMH  of MI LDL goal=,70   Hypertension    Myocardial infarction [facility])    2006   Other cataract    Personal history of colonic polyps    Shortness of breath    DOE   Sleep apnea    uses CPAP    Past Surgical History:  Procedure Laterality Date   CARDIAC CATHETERIZATION     2006   CATARACT EXTRACTION Bilateral    COLONOSCOPY W/ POLYPECTOMY     CORONARY ARTERY BYPASS GRAFT  2006   X 4    EYE SURGERY     HERNIA REPAIR     VENTRAL, LEFT & RIGHT GROIN   TOTAL KNEE ARTHROPLASTY  2006   TOTAL KNEE ARTHROPLASTY  08/25/2011   Procedure: TOTAL KNEE ARTHROPLASTY;  Surgeon: Nilda Simmer, MD;  Location: [facility] OR;  Service: Orthopedics;  Laterality: Right;  DR Thurston Hole WANTS 90 MINUTES FOR SURGERY    Current Outpatient Medications  Medication Instructions   amoxicillin (AMOXIL) 500 MG capsule TAKE 4 CAPSULES BY MOUTH AS NEEDED FOR DENTAL PROCEDURE   aspirin EC 325 mg, Daily   atorvastatin (LIPITOR)  40 mg, Oral, Daily   azelastine (ASTELIN) 0.1 % nasal spray 2 sprays, Each Nare, 2 times daily, Use in each nostril as directed   celecoxib (CELEBREX) 100 mg, Oral, 2 times daily PRN   cetirizine (ZYRTEC) 10 mg, Daily   ezetimibe (ZETIA) 10 mg, Oral, Daily   metoprolol succinate (TOPROL-XL) 25 mg, Oral, Daily   Multiple Vitamin (MULTIVITAMIN) capsule 1 capsule, Daily   nitroGLYCERIN (NITROSTAT) 0.4 mg, Sublingual, Every 5 min PRN   omeprazole (PRILOSEC) 20 mg, Oral, Daily before breakfast       Objective:   Physical Exam BP 136/82   Pulse 68   Temp 98 F (36.7 C) (Oral)   Resp 16   Ht 5\' 9"  (1.753 m)   Wt 200 lb (90.7 kg)   SpO2 97%   BMI 29.53 kg/m  General:   Well developed, NAD, BMI noted.  HEENT:  Normocephalic . Face symmetric, atraumatic   Abdomen:  Not distended, soft, non-tender. No rebound or rigidity.   DRE: External: Normal.  Normal sphincter tone, prostate minimally enlarged.  Brown stool with a very small amount of red blood. Anoscopy: With the patient consent I introduced self self lighted well-lubricated  anoscope. He had multiple small hemorrhoids, they look slightly erythematous and I see a small amount of bleeding.  Skin: Not pale. Not jaundice Lower extremities: no pretibial edema bilaterally  Neurologic:  alert & oriented X3.  Speech normal, gait appropriate for age and unassisted Psych--  Cognition and judgment appear intact.  Cooperative with normal attention span and concentration.  Behavior appropriate. No anxious or depressed appearing.     Assessment     Assessment  Prediabetes HTN Hyperlipidemia CAD, MI 2006, CABG, RBBB OSA , on CPAP DJD Knee replacement-- on abx pre-dental procedures and: BP today is very good, continue Elevated PSA Melanoma : L ear, removed  2022  PLAN: Rectal bleeding: As described above, strongly suspect on clinical grounds that this is distal benign bleeding from hemorrhoids.  Last colonoscopy 04-2021,  saw GI few days ago, they agree on holding further colonoscopy unless he develops symptoms. Plan: Avoid straining, hold Celebrex for few days, okay to continue aspirin 325, Anusol suppositories sent. Call immediately if severe bleeding, nausea vomiting. Will notify GI.    2-4 Here for CPX - Tdap: 2025 - - PNA 23: 07/2013 ; prevnar:04-2015; PNM 20: 07-2021 - s/p  zostavax:06/2011; Shingrix x2 - Had a Flu shot and a COVID-vaccine recently - Recommend RSV -CCS:  cscope 02/2006 Diverticulosis cscope 04-2016- 1 polyp, next was due 04/2021, will see GI soon to discuss CCS -prostate ca screening/elevated PSA: See comments under elevated PSA. -labs :CMP FLP CBC -Diet and exercise: Doing well, playing golf. - ACP information on file  Other issues addressed today: Prediabetes, last A1c very good. HTN: Reports ambulatory BPs are normal, continue metoprolol. Hyperlipidemia: On atorvastatin 40 mg and Zetia.  Check FLP. CAD: Denies chest pain. DJD: Takes NSAIDs sporadically before playing golf, encouraged to take with food. History of melanoma, due to see dermatology, encouraged to call and set up. Increased PSA saw urology 08/2019, DRE was wnl.  Never had a biopsy. PSA  2019: 5.4,  08-2019: 4.08; 07-2020: 3.78; 07-2021: 4.09.  04-2023: 5.7 PSA was checked by cardiology in October 2024, slightly higher than before, at this point we agreed to have an office visit in 3 to 4 months for DRE and PSA --although he is not interested on aggressive treatment or evaluation, he understand that if he has prostate cancer it would be a slow/indolent condition RTC 3 to 4 months

## 2023-09-23 NOTE — Telephone Encounter (Signed)
 Null

## 2023-09-23 NOTE — Assessment & Plan Note (Signed)
 Rectal bleeding: As described above, strongly suspect on clinical grounds that this is distal benign bleeding from hemorrhoids. Last colonoscopy 04-2021, saw GI few days ago, they agree on holding further colonoscopy unless he develops symptoms. Plan: Avoid straining, hold Celebrex for few days, okay to continue aspirin 325, Anusol suppositories sent. Call immediately if severe bleeding, nausea vomiting. Notified GI via message, will keep them posted if symptoms resurface

## 2023-11-22 ENCOUNTER — Other Ambulatory Visit: Payer: Self-pay | Admitting: Internal Medicine

## 2023-12-23 ENCOUNTER — Ambulatory Visit: Payer: Medicare PPO | Admitting: Internal Medicine

## 2023-12-23 ENCOUNTER — Encounter: Payer: Self-pay | Admitting: Internal Medicine

## 2023-12-23 VITALS — BP 126/60 | HR 60 | Temp 97.9°F | Resp 16 | Ht 69.0 in | Wt 195.2 lb

## 2023-12-23 DIAGNOSIS — R972 Elevated prostate specific antigen [PSA]: Secondary | ICD-10-CM | POA: Diagnosis not present

## 2023-12-23 LAB — PSA: PSA: 7.18 ng/mL — ABNORMAL HIGH (ref 0.10–4.00)

## 2023-12-23 NOTE — Progress Notes (Unsigned)
 Subjective:    Patient ID: Benjamin Adkins, male    DOB: [birth year], 83 y.o.   MRN: [id number]  DOS:  12/23/2023 Type of visit - description: Routine follow-up  Since the last visit, no further rectal bleeding. Denies chest pain or difficulty breathing. No lower extremity edema No dysuria or gross hematuria.  No difficulty urinating  Review of Systems See above   Past Medical History:  Diagnosis Date   Arthritis    Coronary atherosclerosis of unspecified type of vessel, native or graft    MI 2006, h/o CABG   DJD (degenerative joint disease)    Elevated prostate specific antigen (PSA)    Elevated PSA    Esophageal reflux    GERD (gastroesophageal reflux disease)    HTN (hypertension)    Hyperlipidemia    nmr lipoprofile 2004: LDL 154 (1653/711), HDL 42, TG 80.NMR  LDL =,125, but PMH  of MI LDL goal=,70   Hypertension    Myocardial infarction [facility])    2006   Other cataract    Personal history of colonic polyps    Shortness of breath    DOE   Sleep apnea    uses CPAP    Past Surgical History:  Procedure Laterality Date   CARDIAC CATHETERIZATION     2006   CATARACT EXTRACTION Bilateral    COLONOSCOPY W/ POLYPECTOMY     CORONARY ARTERY BYPASS GRAFT  2006   X 4    EYE SURGERY     HERNIA REPAIR     VENTRAL, LEFT & RIGHT GROIN   TOTAL KNEE ARTHROPLASTY  2006   TOTAL KNEE ARTHROPLASTY  08/25/2011   Procedure: TOTAL KNEE ARTHROPLASTY;  Surgeon: Genevie Kerns, MD;  Location: [facility] OR;  Service: Orthopedics;  Laterality: Right;  DR Jinger Mount WANTS 90 MINUTES FOR SURGERY    Current Outpatient Medications  Medication Instructions   amoxicillin  (AMOXIL ) 500 MG capsule TAKE 4 CAPSULES BY MOUTH AS NEEDED FOR DENTAL PROCEDURE   aspirin  EC 325 mg, Daily   atorvastatin  (LIPITOR) 40 mg, Oral, Daily   azelastine  (ASTELIN ) 0.1 % nasal spray 2 sprays, Each Nare, 2 times daily, Use in each nostril as directed   celecoxib  (CELEBREX ) 100 mg, Oral, 2 times daily PRN   cetirizine (ZYRTEC)  10 mg, Daily   ezetimibe  (ZETIA ) 10 mg, Oral, Daily   hydrocortisone  (ANUSOL -HC) 25 mg, Rectal, 2 times daily PRN   metoprolol  succinate (TOPROL -XL) 25 mg, Oral, Daily   Multiple Vitamin (MULTIVITAMIN) capsule 1 capsule, Daily   nitroGLYCERIN  (NITROSTAT ) 0.4 mg, Sublingual, Every 5 min PRN   omeprazole  (PRILOSEC) 20 mg, Oral, Daily before breakfast       Objective:   Physical Exam BP 126/60   Pulse 60   Temp 97.9 F (36.6 C) (Oral)   Resp 16   Ht 5\' 9"  (1.753 m)   Wt 195 lb 4 oz (88.6 kg)   SpO2 96%   BMI 28.83 kg/m  General:   Well developed, NAD, BMI noted. HEENT:  Normocephalic . Face symmetric, atraumatic Lungs:  CTA B Normal respiratory effort, no intercostal retractions, no accessory muscle use. Heart: RRR,  no murmur.  Lower extremities: no pretibial edema bilaterally DRE: Normal stools, prostate is slightly enlarged, nontender, nonnodular. Skin: Not pale. Not jaundice Neurologic:  alert & oriented X3.  Speech normal, gait appropriate for age and unassisted Psych--  Cognition and judgment appear intact.  Cooperative with normal attention span and concentration.  Behavior appropriate. No anxious or depressed appearing.  Assessment     Assessment  Prediabetes HTN Hyperlipidemia CAD, MI 2006, CABG, RBBB OSA , on CPAP DJD Knee replacement-- on abx pre-dental procedures and: BP today is very good, continue Elevated PSA Melanoma : L ear, removed  2022  PLAN: Rectal bleeding: See LOV, no further symptoms.  Observation. Increased PSA saw urology 08/2019, DRE was wnl.  Never had a biopsy. PSA  2019: 5.4,  08-2019: 4.08; 07-2020: 3.78; 07-2021: 4.09.  04-2023: 5.7 At this point he is asymptomatic, DRE negative, will check a PSA. RTC 08/2024 CPX

## 2023-12-23 NOTE — Patient Instructions (Signed)
   Check the  blood pressure regularly Blood pressure goal:  between 110/65 and  135/85. If it is consistently higher or lower, let me know     GO TO THE LAB :  Get the blood work   Your results will be posted on MyChart with my comments  Next office visit for a physical exam by 08-2024.  Sooner if needed. Please make an appointment before you leave today

## 2023-12-24 ENCOUNTER — Ambulatory Visit: Payer: Self-pay | Admitting: Internal Medicine

## 2023-12-24 NOTE — Assessment & Plan Note (Signed)
 Rectal bleeding: See LOV, no further symptoms.  Observation. Increased PSA saw urology 08/2019, DRE was wnl.  Never had a biopsy. PSA  2019: 5.4,  08-2019: 4.08; 07-2020: 3.78; 07-2021: 4.09.  04-2023: 5.7 At this point he is asymptomatic, DRE negative, will check a PSA. RTC 08/2024 CPX

## 2023-12-29 ENCOUNTER — Other Ambulatory Visit: Payer: Self-pay

## 2023-12-29 DIAGNOSIS — R972 Elevated prostate specific antigen [PSA]: Secondary | ICD-10-CM

## 2024-01-06 ENCOUNTER — Other Ambulatory Visit: Payer: Self-pay | Admitting: Internal Medicine

## 2024-03-03 ENCOUNTER — Telehealth: Payer: Self-pay

## 2024-03-03 NOTE — Telephone Encounter (Signed)
 Copied from CRM #[id number]. Topic: Clinical - Medical Advice >> Mar 03, 2024  2:36 PM Chiquita SQUIBB wrote: Reason for CRM: Patient is calling in regarding having a cold recently and everything has gone away on its own but a cough that has not gone away, patient is asking if the doctor can give him anything or what he should do for the cough.

## 2024-03-03 NOTE — Telephone Encounter (Signed)
**Note De-identified  [place] Obfuscation** Please advise 

## 2024-03-04 NOTE — Telephone Encounter (Signed)
 Spoke w/ Pt- informed of recommendations. Pt stated because he didn't receive a call back last night (call received yesterday at 2:36pm), that he self medicated and is feeling better this morning. I apologized for the delay in response, I had to wait for PCP recommendations. I offered to send in the tessalon for him but Pt declined.

## 2024-03-04 NOTE — Telephone Encounter (Signed)
 Recommend: - OTC Mucinex DM twice daily as needed -In addition he can take Tessalon Perles 200 mg 1 tablet 3 times a day as needed.  Send a prescription for #30 no refills. - OV if not improving gradually

## 2024-03-07 ENCOUNTER — Ambulatory Visit (INDEPENDENT_AMBULATORY_CARE_PROVIDER_SITE_OTHER): Admitting: *Deleted

## 2024-03-07 VITALS — BP 138/80 | HR 70 | Temp 98.3°F | Resp 16 | Ht 69.0 in | Wt 193.4 lb

## 2024-03-07 DIAGNOSIS — Z0000 Encounter for general adult medical examination without abnormal findings: Secondary | ICD-10-CM

## 2024-03-07 NOTE — Progress Notes (Signed)
 Subjective:   Benjamin Adkins is a 83 y.o. who presents for a Medicare Wellness preventive visit.  As a reminder, Annual Wellness Visits don't include a physical exam, and some assessments may be limited, especially if this visit is performed virtually. We may recommend an in-person follow-up visit with your provider if needed.  Visit Complete: In person  Persons Participating in Visit: Patient.  AWV Questionnaire: Yes: Patient Medicare AWV questionnaire was completed by the patient on 03/01/24; I have confirmed that all information answered by patient is correct and no changes since this date.  Cardiac Risk Factors include: male gender;advanced age (>32men, >104 women);dyslipidemia;hypertension;Other (see comment), Risk factor comments: OSA, Hx of MI, coronary atherosclerosis     Objective:    Today's Vitals   03/07/24 0854  BP: 138/80  Pulse: 70  Resp: 16  Temp: 98.3 F (36.8 C)  TempSrc: Oral  SpO2: 96%  Weight: 193 lb 6.4 oz (87.7 kg)  Height: 5' 9 (1.753 m)   Body mass index is 28.56 kg/m.     03/07/2024    9:07 AM 03/03/2023    8:59 AM 02/20/2022   10:11 AM 02/14/2021   11:46 AM 02/02/2020    9:37 AM 11/25/2018   11:25 AM 11/19/2017   10:14 AM  Advanced Directives  Does Patient Have a Medical Advance Directive? Yes Yes Yes Yes Yes Yes Yes   Type of Estate agent of [place];Living will Healthcare Power of [place];Living will;Out of facility DNR (pink MOST or yellow form) Healthcare Power of [place];Living will Healthcare Power of [place];Living will Healthcare Power of [place];Living will Healthcare Power of [place];Living will Living will;Healthcare Power of Attorney  Does patient want to make changes to medical advance directive? No - Patient declined No - Patient declined No - Patient declined  No - Patient declined No - Patient declined    Copy of Healthcare Power of Attorney in Chart? Yes - validated most recent copy scanned in chart (See row  information) Yes - validated most recent copy scanned in chart (See row information) Yes - validated most recent copy scanned in chart (See row information) No - copy requested No - copy requested Yes - validated most recent copy scanned in chart (See row information)  No - copy requested      Data saved with a previous flowsheet row definition    Current Medications (verified) Outpatient Encounter Medications as of 03/07/2024  Medication Sig   amoxicillin  (AMOXIL ) 500 MG capsule TAKE 4 CAPSULES BY MOUTH AS NEEDED FOR DENTAL PROCEDURE   aspirin  325 MG EC tablet Take 325 mg by mouth daily.   atorvastatin  (LIPITOR) 40 MG tablet Take 1 tablet (40 mg total) by mouth daily.   azelastine  (ASTELIN ) 0.1 % nasal spray Place 2 sprays into both nostrils 2 (two) times daily. Use in each nostril as directed   celecoxib  (CELEBREX ) 100 MG capsule Take 1 capsule (100 mg total) by mouth 2 (two) times daily as needed for moderate pain.   cetirizine (ZYRTEC) 10 MG tablet Take 10 mg by mouth daily.   ezetimibe  (ZETIA ) 10 MG tablet TAKE 1 TABLET BY MOUTH EVERY DAY   hydrocortisone  (ANUSOL -HC) 25 MG suppository Place 1 suppository (25 mg total) rectally 2 (two) times daily as needed for hemorrhoids.   metoprolol  succinate (TOPROL -XL) 25 MG 24 hr tablet Take 1 tablet (25 mg total) by mouth daily.   Multiple Vitamin (MULTIVITAMIN) capsule Take 1 capsule by mouth daily.   nitroGLYCERIN  (NITROSTAT ) 0.4 MG  SL tablet Place 1 tablet (0.4 mg total) under the tongue every 5 (five) minutes as needed.   omeprazole  (PRILOSEC) 20 MG capsule Take 1 capsule (20 mg total) by mouth daily before breakfast.   No facility-administered encounter medications on file as of 03/07/2024.    Allergies (verified) Short ragweed pollen ext   History: Past Medical History:  Diagnosis Date   Arthritis    Coronary atherosclerosis of unspecified type of vessel, native or graft    MI 2006, h/o CABG   DJD (degenerative joint disease)     Elevated prostate specific antigen (PSA)    Elevated PSA    Esophageal reflux    GERD (gastroesophageal reflux disease)    HTN (hypertension)    Hyperlipidemia    nmr lipoprofile 2004: LDL 154 (1653/711), HDL 42, TG 80.NMR  LDL =,125, but PMH  of MI LDL goal=,70   Hypertension    Myocardial infarction [facility])    2006   Other cataract    Personal history of colonic polyps    Shortness of breath    DOE   Sleep apnea    uses CPAP   Past Surgical History:  Procedure Laterality Date   CARDIAC CATHETERIZATION     2006   CATARACT EXTRACTION Bilateral    COLONOSCOPY W/ POLYPECTOMY     CORONARY ARTERY BYPASS GRAFT  2006   X 4    EYE SURGERY     HERNIA REPAIR     VENTRAL, LEFT & RIGHT GROIN   TOTAL KNEE ARTHROPLASTY  2006   TOTAL KNEE ARTHROPLASTY  08/25/2011   Procedure: TOTAL KNEE ARTHROPLASTY;  Surgeon: Lamar DELENA Millman, MD;  Location: [facility] OR;  Service: Orthopedics;  Laterality: Right;  DR MILLMAN CHRISTIAN 90 MINUTES FOR SURGERY   Family History  Problem Relation Age of Onset   Hypertension Mother    Breast cancer Mother    Coronary artery disease Mother    Heart disease Mother    Arthritis Mother    Cancer Mother    Stomach cancer Father    Breast cancer Sister    Crohn's disease Sister    Arthritis Sister    Asthma Sister        childhood   Arthritis Brother    Heart disease Brother    Heart attack Paternal Grandfather 28   Colon cancer Neg Hx    Prostate cancer Neg Hx    Social History   Socioeconomic History   Marital status: Widowed    Spouse name: Not on file   Number of children: 1   Years of education: Not on file   Highest education level: Master's degree (e.g., MA, MS, MEng, MEd, MSW, MBA)  Occupational History   Occupation: retired  Tobacco Use   Smoking status: Former    Current packs/day: 0.00    Average packs/day: 0.3 packs/day for 3.0 years (0.8 ttl pk-yrs)    Types: Cigarettes    Start date: 04/12/1959    Quit date: 08/12/1961    Years since  quitting: 62.6   Smokeless tobacco: Never   Tobacco comments:    during college  Vaping Use   Vaping status: Never Used  Substance and Sexual Activity   Alcohol use: Yes    Alcohol/week: 1.0 standard drink of alcohol    Types: 1 Glasses of wine per week    Comment: with dinner occ   Drug use: No   Sexual activity: Yes  Other Topics Concern   Not on file  Social History Narrative   Lives alone.    Has a daughter in [place] , [place] G-son   Daughter: [name] [phone]   Social Drivers of Health   Financial Resource Strain: Low Risk  (03/07/2024)   Overall Financial Resource Strain (CARDIA)    Difficulty of Paying Living Expenses: Not hard at all  Food Insecurity: No Food Insecurity (03/07/2024)   Hunger Vital Sign    Worried About Running Out of Food in the Last Year: Never true    Ran Out of Food in the Last Year: Never true  Transportation Needs: No Transportation Needs (03/07/2024)   PRAPARE - Administrator, Civil Service (Medical): No    Lack of Transportation (Non-Medical): No  Physical Activity: Sufficiently Active (03/07/2024)   Exercise Vital Sign    Days of Exercise per Week: 3 days    Minutes of Exercise per Session: 90 min  Stress: No Stress Concern Present (03/07/2024)   [employer] of Occupational Health - Occupational Stress Questionnaire    Feeling of Stress: Not at all  Social Connections: Moderately Integrated (03/07/2024)   Social Connection and Isolation Panel    Frequency of Communication with Friends and Family: More than three times a week    Frequency of Social Gatherings with Friends and Family: Three times a week    Attends Religious Services: More than 4 times per year    Active Member of Clubs or Organizations: Yes    Attends Banker Meetings: More than 4 times per year    Marital Status: Widowed    Tobacco Counseling Counseling given: Not Answered Tobacco comments: during college    Clinical  Intake:  Pre-visit preparation completed: Yes  Pain : No/denies pain     BMI - recorded: 28.56 Nutritional Status: BMI 25 -29 Overweight Nutritional Risks: None Diabetes: No  Lab Results  Component Value Date   HGBA1C 5.5 08/21/2022   HGBA1C 5.5 08/15/2021   HGBA1C 5.7 08/14/2020     How often do you need to have someone help you when you read instructions, pamphlets, or other written materials from your doctor or pharmacy?: 1 - Never What is the last grade level you completed in school?: Master's degree  Interpreter Needed?: No  Information entered by :: Lolita Libra, CMA   Activities of Daily Living     03/01/2024    3:39 PM  In your present state of health, do you have any difficulty performing the following activities:  Hearing? 0  Vision? 0  Difficulty concentrating or making decisions? 0  Walking or climbing stairs? 0  Dressing or bathing? 0  Doing errands, shopping? 0  Preparing Food and eating ? N  Using the Toilet? N  In the past six months, have you accidently leaked urine? N  Do you have problems with loss of bowel control? N  Managing your Medications? N  Managing your Finances? N  Housekeeping or managing your Housekeeping? N    Patient Care Team: Amon Aloysius BRAVO, MD as PCP - General (Internal Medicine) Delford Maude BROCKS, MD as PCP - Cardiology (Cardiology) Timmy Maude SAUNDERS, MD as Consulting Physician (Oncology) Chauncey Redell Agent, MD (Inactive) as Consulting Physician (Urology)  I have updated your Care Teams any recent Medical Services you may have received from other providers in the past year.     Assessment:   This is a routine wellness examination for Benjamin Adkins.  Hearing/Vision screen Hearing Screening - Comments:: Denies hearing difficulties.  Vision  Screening - Comments:: Up to date with [employer]   Goals Addressed   None    Depression Screen     03/07/2024    9:05 AM 12/23/2023   10:05 AM 08/25/2023    9:53 AM 03/03/2023     9:03 AM 08/21/2022   10:09 AM 02/20/2022   10:12 AM 08/15/2021   10:06 AM  PHQ 2/9 Scores  PHQ - 2 Score 0 0 0 0 0 0 0    Fall Risk     03/01/2024    3:39 PM 12/23/2023   10:05 AM 08/25/2023    9:53 AM 03/02/2023    3:08 PM 02/25/2023   11:21 AM  Fall Risk   Falls in the past year? 0 0 0 0 0  Number falls in past yr: 0 0 0 0 0  Injury with Fall? 0 0 0 0 0  Risk for fall due to : No Fall Risks   No Fall Risks   Follow up Education provided Falls evaluation completed;Education provided Education provided;Falls evaluation completed Falls evaluation completed     MEDICARE RISK AT HOME:  Medicare Risk at Home Any stairs in or around the home?: (Patient-Rptd) Yes If so, are there any without handrails?: (Patient-Rptd) No Home free of loose throw rugs in walkways, pet beds, electrical cords, etc?: (Patient-Rptd) Yes Adequate lighting in your home to reduce risk of falls?: (Patient-Rptd) Yes Life alert?: (Patient-Rptd) No Use of a cane, walker or w/c?: (Patient-Rptd) No Grab bars in the bathroom?: (Patient-Rptd) No Shower chair or bench in shower?: (Patient-Rptd) No Elevated toilet seat or a handicapped toilet?: (Patient-Rptd) No  TIMED UP AND GO:  Was the test performed?  Yes  Length of time to ambulate 10 feet: 5 sec Gait steady and fast without use of assistive device  Cognitive Function: 6CIT completed        03/07/2024    9:07 AM 03/03/2023    9:06 AM 02/20/2022   10:20 AM  6CIT Screen  What Year? 0 points 0 points 0 points  What month? 0 points 0 points 0 points  What time? 0 points 0 points 0 points  Count back from 20 0 points 0 points 0 points  Months in reverse 0 points 0 points 0 points  Repeat phrase 2 points 0 points 0 points  Total Score 2 points 0 points 0 points    Immunizations Immunization History  Administered Date(s) Administered   Fluad Quad(high Dose 65+) 03/17/2019, 05/19/2022   Fluad Trivalent(High Dose 65+) 05/12/2023   Influenza Split 05/27/2011,  04/02/2012   Influenza Whole 05/04/2007, 05/18/2008, 05/22/2009, 05/16/2010   Influenza, High Dose Seasonal PF 04/28/2013, 04/23/2015, 04/10/2016, 04/24/2021   Influenza,inj,Quad PF,6+ Mos 08/10/2014   Influenza-Unspecified 04/28/2017, 04/29/2018, 04/20/2020   Moderna Covid-19 Fall Seasonal Vaccine 44yrs & older 05/12/2023   Moderna Sars-Covid-2 Vaccination 08/01/2019, 08/29/2019, 05/17/2020   PNEUMOCOCCAL CONJUGATE-20 08/15/2021   Pfizer Covid-19 Vaccine Bivalent Booster 59yrs & up 05/15/2021   Pneumococcal Conjugate-13 04/23/2015   Pneumococcal Polysaccharide-23 08/09/2013   Tetanus 08/23/2013   Zoster Recombinant(Shingrix) 04/16/2019, 10/01/2019   Zoster, Live 07/02/2011    Screening Tests Health Maintenance  Topic Date Due   DTaP/Tdap/Td (1 - Tdap) 08/24/2013   INFLUENZA VACCINE  02/19/2024   COVID-19 Vaccine (6 - 2024-25 season) 12/21/2024 (Originally 11/10/2023)   Medicare Annual Wellness (AWV)  03/07/2025   Pneumococcal Vaccine: 50+ Years  Completed   Zoster Vaccines- Shingrix  Completed   HPV VACCINES  Aged Out   Meningococcal B  Vaccine  Aged Out   Pneumococcal Vaccine  Discontinued   Hepatitis C Screening  Discontinued    Health Maintenance  Health Maintenance Due  Topic Date Due   DTaP/Tdap/Td (1 - Tdap) 08/24/2013   INFLUENZA VACCINE  02/19/2024   Health Maintenance Items Addressed: Will get tetanus and flu vaccines at pharmacy.  Additional Screening:  Vision Screening: Recommended annual ophthalmology exams for early detection of glaucoma and other disorders of the eye. Would you like a referral to an eye doctor? No    Dental Screening: Recommended annual dental exams for proper oral hygiene  Community Resource Referral / Chronic Care Management: CRR required this visit?  No   CCM required this visit?  No   Plan:    I have personally reviewed and noted the following in the patient's chart:   Medical and social history Use of alcohol, tobacco or  illicit drugs  Current medications and supplements including opioid prescriptions. Patient is not currently taking opioid prescriptions. Functional ability and status Nutritional status Physical activity Advanced directives List of other physicians Hospitalizations, surgeries, and ER visits in previous 12 months Vitals Screenings to include cognitive, depression, and falls Referrals and appointments  In addition, I have reviewed and discussed with patient certain preventive protocols, quality metrics, and best practice recommendations. A written personalized care plan for preventive services as well as general preventive health recommendations were provided to patient.   Lolita Libra, CMA   03/07/2024   After Visit Summary: (In Person-Printed) AVS printed and given to the patient  Notes: Nothing significant to report at this time.

## 2024-03-07 NOTE — Patient Instructions (Addendum)
 Benjamin Adkins , Thank you for taking time out of your busy schedule to complete your Annual Wellness Visit with me. I enjoyed our conversation and look forward to speaking with you again next year. I, as well as your care team,  appreciate your ongoing commitment to your health goals. Please review the following plan we discussed and let me know if I can assist you in the future. Your Game plan/ To Do List     Follow up Visits: Next Medicare AWV with our clinical staff: 03/08/25  9am  Next Office Visit with your provider: 08/30/24  9:20am  Clinician Recommendations:  Aim for 30 minutes of exercise or brisk walking, 6-8 glasses of water, and 5 servings of fruits and vegetables each day.   You will need to get the following vaccines at your local pharmacy: Flu      This is a list of the screening recommended for you and due dates:  Health Maintenance  Topic Date Due   DTaP/Tdap/Td vaccine (1 - Tdap) 08/24/2013   Medicare Annual Wellness Visit  03/02/2024   Flu Shot  02/19/2024   COVID-19 Vaccine (6 - 2024-25 season) 12/21/2024*   Pneumococcal Vaccine for age over 41  Completed   Zoster (Shingles) Vaccine  Completed   HPV Vaccine  Aged Out   Meningitis B Vaccine  Aged Out   Pneumococcal Vaccine  Discontinued   Hepatitis C Screening  Discontinued  *Topic was postponed. The date shown is not the original due date.    See attachments for Preventive Care and Fall Prevention Tips.

## 2024-03-07 NOTE — Progress Notes (Signed)
 I have reviewed and agree with [facility] documentation.  Aloysius Mech, MD

## 2024-03-08 ENCOUNTER — Telehealth: Payer: Self-pay | Admitting: Internal Medicine

## 2024-03-08 NOTE — Telephone Encounter (Signed)
     Copied from CRM [id number]. Topic: Clinical - Medication Question >> Mar 08, 2024 10:59 AM Jasmin G wrote: Reason for CRM: Pt called regarding continuous cold issue from last week, please refer to Encounter on 8/14 at 2:41 p.m, he called due to declining a prescription last weeks but will be in need now as he has been experiencing  pretty bad congestion and cough. Please call him back at [phone] to discuss.

## 2024-03-08 NOTE — Telephone Encounter (Signed)
 Pt informed last week if not improving he would need an appt.

## 2024-03-16 ENCOUNTER — Ambulatory Visit: Admitting: Family Medicine

## 2024-03-16 ENCOUNTER — Encounter: Payer: Self-pay | Admitting: Family Medicine

## 2024-03-16 VITALS — BP 148/82 | HR 67 | Temp 97.9°F | Ht 69.0 in | Wt 191.0 lb

## 2024-03-16 DIAGNOSIS — R051 Acute cough: Secondary | ICD-10-CM

## 2024-03-16 MED ORDER — AZELASTINE HCL 0.1 % NA SOLN: 2.0000 | Freq: Two times a day (BID) | NASAL | 12 refills | Status: AC

## 2024-03-16 MED ORDER — FLUTICASONE PROPIONATE 50 MCG/ACT NA SUSP: 2.0000 | Freq: Every day | NASAL | 6 refills | Status: AC

## 2024-03-16 MED ORDER — BENZONATATE 200 MG PO CAPS: 200.0000 mg | ORAL_CAPSULE | Freq: Two times a day (BID) | ORAL | 0 refills | Status: DC | PRN

## 2024-03-16 NOTE — Progress Notes (Signed)
 Acute Office Visit  Subjective:     Patient ID: Benjamin Adkins, male    DOB: 25-May-1941, 83 y.o.   MRN: [id number]  Chief Complaint  Patient presents with   URI    HPI Patient is in today for cough.   Discussed the use of AI scribe software for clinical note transcription with the patient, who gave verbal consent to proceed.  History of Present Illness Benjamin Adkins is an 83 year old male who presents with a persistent cough following an upper respiratory infection.  He experienced an upper respiratory infection a couple of weeks ago, which has mostly resolved. Initially, he managed his symptoms with over-the-counter decongestants, Advil, and cough drops. However, he continues to have a persistent, annoying cough, described as a 'dry tickle.' The cough is primarily triggered by using his voice or engaging in activities that require lung exertion. No current wheezing, difficulty breathing, fever, or mucous production. The cough was previously productive but has since cleared up. He has not been taking his allergy medication, Cetirizine, which he usually takes for springtime allergies.  He uses a CPAP machine for sleep and does not cough when using it. He admits to being lax in cleaning the machine but has resumed regular maintenance. He recalls a recent outing with his grandson to a crowded Glass blower/designer venue, which he speculates might have contributed to his previous symptoms.  He has a history of high blood pressure, which he monitors at home, noting that it has been at the upper end of normal. He experiences 'white coat syndrome,' which may affect his blood pressure readings during medical visits.          ROS All review of systems negative except what is listed in the HPI      Objective:    BP (!) 148/82   Pulse 67   Temp 97.9 F (36.6 C) (Oral)   Ht 5' 9 (1.753 m)   Wt 191 lb (86.6 kg)   SpO2 98%   BMI 28.21 kg/m    Physical Exam Vitals reviewed.   Constitutional:      Appearance: Normal appearance.  HENT:     Head: Normocephalic and atraumatic.     Right Ear: Tympanic membrane normal.     Left Ear: Tympanic membrane normal.     Nose: No rhinorrhea.     Mouth/Throat:     Mouth: Mucous membranes are moist.     Comments: Some postnasal drainage Cardiovascular:     Rate and Rhythm: Normal rate and regular rhythm.     Heart sounds: Normal heart sounds.  Pulmonary:     Effort: Pulmonary effort is normal.     Breath sounds: Normal breath sounds. No wheezing, rhonchi or rales.     Comments: Occasional dry cough Musculoskeletal:     Cervical back: Normal range of motion and neck supple.  Skin:    General: Skin is warm and dry.  Neurological:     Mental Status: He is alert and oriented to person, place, and time.  Psychiatric:        Mood and Affect: Mood normal.        Behavior: Behavior normal.        Thought Content: Thought content normal.        Judgment: Judgment normal.         No results found for any visits on 03/16/24.      Assessment & Plan:   Problem List Items Addressed This  Visit   None Visit Diagnoses       Acute cough    -  Primary   Relevant Medications   benzonatate  (TESSALON ) 200 MG capsule   azelastine  (ASTELIN ) 0.1 % nasal spray   fluticasone  (FLONASE ) 50 MCG/ACT nasal spray       Assessment & Plan Post-viral cough Persistent dry cough post-upper respiratory infection, likely due to throat and airway irritation, postnasal drainage. Lungs clear. - Restart Zyrtec. - Restart Astelin  nasal spray. - Prescribe Flonase  nasal spray with Astelin . - Prescribe Tessalon  Perles twice daily. - Advise cough drops and hydration. - Instruct to start Mucinex if cough becomes productive.    Patient aware of signs/symptoms requiring further/urgent evaluation.   Meds ordered this encounter  Medications   benzonatate  (TESSALON ) 200 MG capsule    Sig: Take 1 capsule (200 mg total) by mouth 2 (two)  times daily as needed for cough.    Dispense:  20 capsule    Refill:  0    Supervising Provider:   DOMENICA BLACKBIRD A [[id number]]   azelastine  (ASTELIN ) 0.1 % nasal spray    Sig: Place 2 sprays into both nostrils 2 (two) times daily. Use in each nostril as directed    Dispense:  30 mL    Refill:  12    Supervising Provider:   DOMENICA BLACKBIRD A [[id number]]   fluticasone  (FLONASE ) 50 MCG/ACT nasal spray    Sig: Place 2 sprays into both nostrils daily.    Dispense:  16 g    Refill:  6    Supervising Provider:   DOMENICA BLACKBIRD A [[id number]]    Return if symptoms worsen or fail to improve.  Benjamin Adkins Mon, NP

## 2024-03-18 ENCOUNTER — Telehealth: Payer: Self-pay | Admitting: Internal Medicine

## 2024-03-18 DIAGNOSIS — G4733 Obstructive sleep apnea (adult) (pediatric): Secondary | ICD-10-CM

## 2024-03-18 NOTE — Telephone Encounter (Signed)
 PCP no longer does OSA management. Pulmonary referral placed and mychart message sent to Pt.

## 2024-03-18 NOTE — Telephone Encounter (Signed)
 Copied from CRM [id number]. Topic: General - Other >> Mar 18, 2024 12:26 PM Jasmin G wrote: Reason for CRM: Pt uses a cpap machine and his Insurance, [employer] recently cancelled his prescription and he needs a new one renewed and faxed to [phone].

## 2024-03-22 ENCOUNTER — Ambulatory Visit (INDEPENDENT_AMBULATORY_CARE_PROVIDER_SITE_OTHER)

## 2024-03-22 ENCOUNTER — Ambulatory Visit: Admitting: Adult Health

## 2024-03-22 ENCOUNTER — Ambulatory Visit: Payer: Self-pay | Admitting: Internal Medicine

## 2024-03-22 ENCOUNTER — Encounter: Payer: Self-pay | Admitting: Adult Health

## 2024-03-22 VITALS — BP 136/60 | HR 72 | Temp 98.2°F | Ht 69.0 in | Wt 190.6 lb

## 2024-03-22 DIAGNOSIS — J069 Acute upper respiratory infection, unspecified: Secondary | ICD-10-CM

## 2024-03-22 DIAGNOSIS — G4733 Obstructive sleep apnea (adult) (pediatric): Secondary | ICD-10-CM | POA: Diagnosis not present

## 2024-03-22 DIAGNOSIS — Z951 Presence of aortocoronary bypass graft: Secondary | ICD-10-CM | POA: Diagnosis not present

## 2024-03-22 DIAGNOSIS — R059 Cough, unspecified: Secondary | ICD-10-CM | POA: Diagnosis not present

## 2024-03-22 MED ORDER — BENZONATATE 200 MG PO CAPS
200.0000 mg | ORAL_CAPSULE | Freq: Three times a day (TID) | ORAL | 1 refills | Status: AC | PRN
Start: 2024-03-22 — End: 2025-03-22

## 2024-03-22 MED ORDER — PREDNISONE 20 MG PO TABS: 20.0000 mg | ORAL_TABLET | Freq: Every day | ORAL | 0 refills | Status: DC

## 2024-03-22 NOTE — Progress Notes (Signed)
 @Patient  ID: Benjamin Adkins, male    DOB: [birth year], 83 y.o.   MRN: [id number]  Chief Complaint  Patient presents with   Sleep Apnea    Referring provider: Amon Aloysius BRAVO, MD  HPI: 83 yo male followed for OSA   TEST/EVENTS :  Sleep study AHI 66/hr, SPO2 67%  03/22/2024 Follow up : OSA Discussed the use of AI scribe software for clinical note transcription with the patient, who gave verbal consent to proceed.  History of Present Illness Benjamin Adkins is an 83 year old male with sleep apnea who presents for CPAP supply renewal and evaluation of a persistent cough. Last office visit 07/2021.   He has been using a CPAP machine with a nasal mask every night for about seven and a half to eight hours. Doing well on CPAP, feels he benefits from CPAP . Using nasal mask. [employer] is DME. Downloads shows 100% compliance with average usage at 7.5hr, AHI 3/hr  on CPAP 13cmH2o.   He has a persistent cough lasting three to four weeks, which began after visiting an indoor venue with his grandson. The cough is accompanied by phlegm and was initially more severe but has improved. He saw his general practitioner a week ago and was prescribed nasal sprays and a cough medication, which have not been effective. No fever, discolored sputum, hemoptysis, or shortness of breath. He has a minimal smoking history and no history of asthma.  He is currently taking Prilosec for reflux and has been using Tessalon  pearls for the cough. He has been advised to use Zyrtec, though he has not been consistent with it. The cough is mostly reflexive and does not occur at night while using the CPAP machine.  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 Prolonged talking exacerbates the cough, but he does not cough at night with the CPAP machine.      Allergies  Allergen Reactions   Short Ragweed Pollen Ext     Immunization History   Administered Date(s) Administered   Fluad Quad(high Dose 65+) 03/17/2019, 05/19/2022   Fluad Trivalent(High Dose 65+) 05/12/2023   INFLUENZA, HIGH DOSE SEASONAL PF 04/28/2013, 04/23/2015, 04/10/2016, 04/24/2021, 03/16/2024   Influenza Split 05/27/2011, 04/02/2012   Influenza Whole 05/04/2007, 05/18/2008, 05/22/2009, 05/16/2010   Influenza,inj,Quad PF,6+ Mos 08/10/2014   Influenza-Unspecified 04/28/2017, 04/29/2018, 04/20/2020   Moderna Sars-Covid-2 Vaccination 08/01/2019, 08/29/2019, 05/17/2020   PNEUMOCOCCAL CONJUGATE-20 08/15/2021   Pfizer Covid-19 Vaccine Bivalent Booster 59yrs & up 05/15/2021   Pneumococcal Conjugate-13 04/23/2015   Pneumococcal Polysaccharide-23 08/09/2013   Tetanus 08/23/2013   Zoster Recombinant(Shingrix) 04/16/2019, 10/01/2019   Zoster, Live 07/02/2011    Past Medical History:  Diagnosis Date   Arthritis    Coronary atherosclerosis of unspecified type of vessel, native or graft    MI 2006, h/o CABG   DJD (degenerative joint disease)    Elevated prostate specific antigen (PSA)    Elevated PSA    Esophageal reflux    GERD (gastroesophageal reflux disease)    HTN (hypertension)    Hyperlipidemia    nmr lipoprofile 2004: LDL 154 (1653/711), HDL 42, TG 80.NMR  LDL =,125, but PMH  of MI LDL goal=,70   Hypertension    Myocardial infarction [facility])    2006   Other cataract    Personal history of colonic polyps    Shortness of breath    DOE   Sleep apnea  uses CPAP    Tobacco History: Social History   Tobacco Use  Smoking Status Former   Current packs/day: 0.00   Average packs/day: 0.3 packs/day for 3.0 years (0.8 ttl pk-yrs)   Types: Cigarettes   Start date: 04/12/1959   Quit date: 08/12/1961   Years since quitting: 62.6  Smokeless Tobacco Never  Tobacco Comments   during college   Counseling given: Not Answered Tobacco comments: during college   Outpatient Medications Prior to Visit  Medication Sig Dispense Refill   amoxicillin  (AMOXIL )  500 MG capsule TAKE 4 CAPSULES BY MOUTH AS NEEDED FOR DENTAL PROCEDURE 20 capsule 0   aspirin  325 MG EC tablet Take 325 mg by mouth daily.     atorvastatin  (LIPITOR) 40 MG tablet Take 1 tablet (40 mg total) by mouth daily. 90 tablet 3   azelastine  (ASTELIN ) 0.1 % nasal spray Place 2 sprays into both nostrils 2 (two) times daily. Use in each nostril as directed 30 mL 12   benzonatate  (TESSALON ) 200 MG capsule Take 1 capsule (200 mg total) by mouth 2 (two) times daily as needed for cough. 20 capsule 0   celecoxib  (CELEBREX ) 100 MG capsule Take 1 capsule (100 mg total) by mouth 2 (two) times daily as needed for moderate pain. 180 capsule 1   cetirizine (ZYRTEC) 10 MG tablet Take 10 mg by mouth daily.     ezetimibe  (ZETIA ) 10 MG tablet TAKE 1 TABLET BY MOUTH EVERY DAY 90 tablet 1   fluticasone  (FLONASE ) 50 MCG/ACT nasal spray Place 2 sprays into both nostrils daily. 16 g 6   hydrocortisone  (ANUSOL -HC) 25 MG suppository Place 1 suppository (25 mg total) rectally 2 (two) times daily as needed for hemorrhoids. 12 suppository 1   metoprolol  succinate (TOPROL -XL) 25 MG 24 hr tablet Take 1 tablet (25 mg total) by mouth daily. 90 tablet 2   Multiple Vitamin (MULTIVITAMIN) capsule Take 1 capsule by mouth daily.     nitroGLYCERIN  (NITROSTAT ) 0.4 MG SL tablet Place 1 tablet (0.4 mg total) under the tongue every 5 (five) minutes as needed. 25 tablet 3   omeprazole  (PRILOSEC) 20 MG capsule Take 1 capsule (20 mg total) by mouth daily before breakfast. 90 capsule 1   No facility-administered medications prior to visit.     Review of Systems:   Constitutional:   No  weight loss, night sweats,  Fevers, chills, fatigue, or  lassitude.  HEENT:   No headaches,  Difficulty swallowing,  Tooth/dental problems, or  Sore throat,                No sneezing, itching, ear ache, +nasal congestion, post nasal drip,   CV:  No chest pain,  Orthopnea, PND, swelling in lower extremities, anasarca, dizziness, palpitations,  syncope.   GI  No heartburn, indigestion, abdominal pain, nausea, vomiting, diarrhea, change in bowel habits, loss of appetite, bloody stools.   Resp:   No chest wall deformity  Skin: no rash or lesions.  GU: no dysuria, change in color of urine, no urgency or frequency.  No flank pain, no hematuria   MS:  No joint pain or swelling.  No decreased range of motion.  No back pain.    Physical Exam  BP 136/60 (BP Location: Left Arm, Patient Position: Sitting)   Pulse 72   Temp 98.2 F (36.8 C) (Oral)   Ht 5' 9 (1.753 m)   Wt 190 lb 9.6 oz (86.5 kg)   SpO2 96% Comment: RA  BMI 28.15 kg/m  GEN: A/Ox3; pleasant , NAD, well nourished    HEENT:  [place]/AT,  NOSE-clear, THROAT-clear, no lesions, no postnasal drip or exudate noted.   NECK:  Supple w/ fair ROM; no JVD; normal carotid impulses w/o bruits; no thyromegaly or nodules palpated; no lymphadenopathy.    RESP  Clear  P & A; w/o, wheezes/ rales/ or rhonchi. no accessory muscle use, no dullness to percussion  CARD:  RRR, no m/r/g, no peripheral edema, pulses intact, no cyanosis or clubbing.  GI:   Soft & nt; nml bowel sounds; no organomegaly or masses detected.   Musco: Warm bil, no deformities or joint swelling noted.   Neuro: alert, no focal deficits noted.    Skin: Warm, no lesions or rashes    Lab Results:  CBC    Component Value Date/Time   WBC 7.3 08/25/2023 1031   RBC 4.63 08/25/2023 1031   HGB 14.3 08/25/2023 1031   HGB 14.8 08/11/2013 1550   HCT 41.4 08/25/2023 1031   HCT 42.7 08/11/2013 1550   PLT 173.0 08/25/2023 1031   PLT 161 08/11/2013 1550   MCV 89.4 08/25/2023 1031   MCV 90 08/11/2013 1550   MCH 31.3 08/11/2013 1550   MCH 31.7 08/28/2011 0630   MCHC 34.5 08/25/2023 1031   RDW 13.9 08/25/2023 1031   RDW 12.6 08/11/2013 1550   LYMPHSABS 1.6 08/25/2023 1031   LYMPHSABS 2.4 08/11/2013 1550   MONOABS 0.7 08/25/2023 1031   EOSABS 0.1 08/25/2023 1031   EOSABS 0.2 08/11/2013 1550   BASOSABS 0.0  08/25/2023 1031   BASOSABS 0.0 08/11/2013 1550    BMET    Component Value Date/Time   NA 137 08/25/2023 1031   K 4.1 08/25/2023 1031   CL 103 08/25/2023 1031   CO2 27 08/25/2023 1031   GLUCOSE 100 (H) 08/25/2023 1031   BUN 14 08/25/2023 1031   CREATININE 0.93 08/25/2023 1031   CALCIUM  8.9 08/25/2023 1031   GFRNONAA 87 (L) 08/28/2011 0630   GFRAA >90 08/28/2011 0630    BNP No results found for: BNP  ProBNP No results found for: PROBNP  Imaging: No results found.  Administration History     None           No data to display          No results found for: NITRICOXIDE      Assessment & Plan:   No problem-specific Assessment & Plan notes found for this encounter.  Assessment and Plan Assessment & Plan Post-viral cough with airway inflammation   He has experienced a persistent cough for 3-4 weeks following a viral illness, likely due to post-viral cough with airway inflammation. There is no discolored sputum, fever, hemoptysis, or vomiting, and no wheezing or shortness of breath. With minimal smoking history and no asthma, secondary pneumonia is considered, prompting a chest x-ray order. The cough is likely worsened by postnasal drainage, reflux, and airway inflammation. He is on omeprazole  for reflux, but additional measures are needed. A chest x-ray will rule out secondary pneumonia. Prescribe prednisone  20mg  days for 5 days to reduce airway inflammation. Recommend Delsym twice daily and continue Tessalon  pearls for cough suppression. Advise taking Zyrtec daily until the cough resolves and add Pepcid at bedtime for 2-3 weeks to manage reflux. Encourage nasal spray use as needed, avoid mint products, and use honey lemon cough drops. Recommend voice rest, avoiding prolonged talking, replacing the toothbrush, and maintaining good hygiene.  Obstructive sleep apnea, well controlled on CPAP   Obstructive  sleep apnea is well controlled with CPAP therapy.  Has  perceived benefit.  He uses a nasal mask and complies with CPAP usage, averaging 7.5 to 8 hours per night. The CPAP machine functions well, and new supplies have been ordered. Ensure CPAP supplies are maintained for the next year. Advise on CPAP maintenance, including using distilled water and keeping the mask and tubing clean. Plan follow-up in one year.   Plan  Patient Instructions  Prednisone  20mg  daily for 5 days, take with food.  Delsym 2 tsp Twice daily  for cough As needed   Tessalon  Three times a day  for cough As needed   Zyrtec 10mg  At bedtime  for 2 weeks then as needed Begin Pepcid 20mg  At bedtime  for 2 weeks and then as needed  Chest xray today   Continue on CPAP At bedtime.  Keep up good work.  Work on healthy weight loss   Follow up with Dr. Neda in 1 year and As needed   Please contact office for sooner follow up if symptoms do not improve or worsen or seek emergency care      Madelin Stank, NP 03/22/2024

## 2024-03-22 NOTE — Patient Instructions (Signed)
 Prednisone  20mg  daily for 5 days, take with food.  Delsym 2 tsp Twice daily  for cough As needed   Tessalon  Three times a day  for cough As needed   Zyrtec 10mg  At bedtime  for 2 weeks then as needed Begin Pepcid 20mg  At bedtime  for 2 weeks and then as needed  Chest xray today   Continue on CPAP At bedtime.  Keep up good work.  Work on healthy weight loss   Follow up with Dr. Neda in 1 year and As needed   Please contact office for sooner follow up if symptoms do not improve or worsen or seek emergency care

## 2024-03-28 DIAGNOSIS — G4733 Obstructive sleep apnea (adult) (pediatric): Secondary | ICD-10-CM | POA: Diagnosis not present

## 2024-04-26 ENCOUNTER — Other Ambulatory Visit

## 2024-04-26 ENCOUNTER — Telehealth: Payer: Self-pay | Admitting: Internal Medicine

## 2024-04-26 DIAGNOSIS — R972 Elevated prostate specific antigen [PSA]: Secondary | ICD-10-CM

## 2024-04-26 LAB — PSA: PSA: 4.52 ng/mL — ABNORMAL HIGH (ref 0.10–4.00)

## 2024-04-26 NOTE — Telephone Encounter (Signed)
 Chart reviewed, only see need for PSA.

## 2024-04-26 NOTE — Telephone Encounter (Signed)
 This pt has a note to have a tsh recheck but I only see an order for a psa. Are we doing the tsh and the psa as well today?

## 2024-04-27 ENCOUNTER — Emergency Department (HOSPITAL_BASED_OUTPATIENT_CLINIC_OR_DEPARTMENT_OTHER)
Admission: EM | Admit: 2024-04-27 | Discharge: 2024-04-27 | Disposition: A | Discharge status: Home / Self Care | Attending: Emergency Medicine | Admitting: Emergency Medicine

## 2024-04-27 ENCOUNTER — Ambulatory Visit: Payer: Self-pay | Admitting: Internal Medicine

## 2024-04-27 ENCOUNTER — Encounter (HOSPITAL_BASED_OUTPATIENT_CLINIC_OR_DEPARTMENT_OTHER): Payer: Self-pay | Admitting: Emergency Medicine

## 2024-04-27 ENCOUNTER — Emergency Department (HOSPITAL_BASED_OUTPATIENT_CLINIC_OR_DEPARTMENT_OTHER)

## 2024-04-27 ENCOUNTER — Ambulatory Visit: Payer: Self-pay

## 2024-04-27 ENCOUNTER — Other Ambulatory Visit: Payer: Self-pay

## 2024-04-27 DIAGNOSIS — R109 Unspecified abdominal pain: Secondary | ICD-10-CM | POA: Diagnosis present

## 2024-04-27 DIAGNOSIS — R935 Abnormal findings on diagnostic imaging of other abdominal regions, including retroperitoneum: Secondary | ICD-10-CM | POA: Diagnosis not present

## 2024-04-27 DIAGNOSIS — K5730 Diverticulosis of large intestine without perforation or abscess without bleeding: Secondary | ICD-10-CM | POA: Diagnosis not present

## 2024-04-27 DIAGNOSIS — Z7982 Long term (current) use of aspirin: Secondary | ICD-10-CM | POA: Insufficient documentation

## 2024-04-27 DIAGNOSIS — R1030 Lower abdominal pain, unspecified: Secondary | ICD-10-CM | POA: Insufficient documentation

## 2024-04-27 DIAGNOSIS — K8020 Calculus of gallbladder without cholecystitis without obstruction: Secondary | ICD-10-CM | POA: Diagnosis not present

## 2024-04-27 DIAGNOSIS — K449 Diaphragmatic hernia without obstruction or gangrene: Secondary | ICD-10-CM | POA: Diagnosis not present

## 2024-04-27 DIAGNOSIS — R590 Localized enlarged lymph nodes: Secondary | ICD-10-CM | POA: Diagnosis not present

## 2024-04-27 LAB — COMPREHENSIVE METABOLIC PANEL WITH GFR
ALT: 16 U/L (ref 0–44)
AST: 32 U/L (ref 15–41)
Albumin: 4 g/dL (ref 3.5–5.0)
Alkaline Phosphatase: 121 U/L (ref 38–126)
Anion gap: 13 (ref 5–15)
BUN: 10 mg/dL (ref 8–23)
CO2: 22 mmol/L (ref 22–32)
Calcium: 9.6 mg/dL (ref 8.9–10.3)
Chloride: 96 mmol/L — ABNORMAL LOW (ref 98–111)
Creatinine, Ser: 0.79 mg/dL (ref 0.61–1.24)
GFR, Estimated: >60 mL/min (ref >60)
Glucose, Bld: 135 mg/dL — ABNORMAL HIGH (ref 70–99)
Potassium: 3.8 mmol/L (ref 3.5–5.1)
Sodium: 131 mmol/L — ABNORMAL LOW (ref 135–145)
Total Bilirubin: 0.7 mg/dL (ref 0.0–1.2)
Total Protein: 7.1 g/dL (ref 6.5–8.1)

## 2024-04-27 LAB — CBC WITH DIFFERENTIAL/PLATELET
Abs Immature Granulocytes: 0.03 K/uL (ref 0.00–0.07)
Basophils Absolute: 0 K/uL (ref 0.0–0.1)
Basophils Relative: 0 %
Eosinophils Absolute: 0.1 K/uL (ref 0.0–0.5)
Eosinophils Relative: 1 %
HCT: 36.9 % — ABNORMAL LOW (ref 39.0–52.0)
Hemoglobin: 12.5 g/dL — ABNORMAL LOW (ref 13.0–17.0)
Immature Granulocytes: 0 %
Lymphocytes Relative: 13 %
Lymphs Abs: 1.5 K/uL (ref 0.7–4.0)
MCH: 28.2 pg (ref 26.0–34.0)
MCHC: 33.9 g/dL (ref 30.0–36.0)
MCV: 83.1 fL (ref 80.0–100.0)
Monocytes Absolute: 1 K/uL (ref 0.1–1.0)
Monocytes Relative: 8 %
Neutro Abs: 9.4 K/uL — ABNORMAL HIGH (ref 1.7–7.7)
Neutrophils Relative %: 78 %
Platelets: 181 K/uL (ref 150–400)
RBC: 4.44 MIL/uL (ref 4.22–5.81)
RDW: 14.3 % (ref 11.5–15.5)
WBC: 12 K/uL — ABNORMAL HIGH (ref 4.0–10.5)
nRBC: 0 % (ref 0.0–0.2)

## 2024-04-27 LAB — URINALYSIS, ROUTINE W REFLEX MICROSCOPIC
Bilirubin Urine: NEGATIVE
Glucose, UA: NEGATIVE mg/dL
Ketones, ur: NEGATIVE mg/dL
Leukocytes,Ua: NEGATIVE
Nitrite: NEGATIVE
Protein, ur: NEGATIVE mg/dL
Specific Gravity, Urine: 1.02 (ref 1.005–1.030)
pH: 7 (ref 5.0–8.0)

## 2024-04-27 LAB — URINALYSIS, MICROSCOPIC (REFLEX): WBC, UA: NONE SEEN WBC/hpf (ref 0–5)

## 2024-04-27 LAB — LIPASE, BLOOD: Lipase: 24 U/L (ref 11–51)

## 2024-04-27 MED ORDER — IOHEXOL 300 MG/ML  SOLN
100.0000 mL | Freq: Once | INTRAMUSCULAR | Status: AC | PRN
  Administered 2024-04-27: 100 mL via INTRAVENOUS

## 2024-04-27 MED ORDER — HYDROCODONE-ACETAMINOPHEN 5-325 MG PO TABS: 0.5000 | ORAL_TABLET | ORAL | 0 refills | Status: DC | PRN

## 2024-04-27 NOTE — ED Triage Notes (Signed)
 Pt c/o general lower abd pain that started at 0300; pain is less severe at this time; denies NVD

## 2024-04-27 NOTE — Telephone Encounter (Signed)
 Advised patient: I agree with the ER evaluation.

## 2024-04-27 NOTE — Telephone Encounter (Signed)
 FYI. Pt refusing to go to ED.

## 2024-04-27 NOTE — ED Provider Notes (Addendum)
 I provided a substantive portion of the care of this patient.  I personally made/approved the management plan for this patient and take responsibility for the patient management.    Patient seen by me along with the physician assistant.  Patient started with lower abdominal pain 3 in the morning. Left side greater than right.  Currently it is still present but is less severe than it was during the night.  Temperature 98.6 pulse 68 respiration 16 blood pressure 157/87.  Not associated with any nausea vomiting or diarrhea.  Past medical history significant for hyperlipidemia hypertension history of MI in 2006 history of CABG.  In history of sleep apnea uses CPAP hypertension MI in 2006 is mention gastroesophageal reflux disease.  Had his bypass in 2006 as well.  Patient is a former smoker quit 1963.  Patient's labs sodium 131.  Patient sodium in the past has been in the mid 130s.  Symptoms seem to change there renal function is normal.  LFTs normal.  Urine normal.  Lipase normal at 24.  White count up a little bit at 12,000 hemoglobin 12.5 platelets 181.  CT abdomen pelvis is pending and will be significant to rule out his lower abdominal pain.  Patient abdomen nontender without any guarding.  CT abdomen for the lower quadrant pain is pending.   Benjamin Erazo, MD 04/27/24 1904  CT scan with several findings.  That do require follow-up but do not require admission.  Some concern with the prostate being enlarged and a little bit of bladder obstruction.  Probably needs follow-up with urology.  Also would recommend follow-up with his regular doctor.  The lesion in L2 always could be metastatic prostate cancer and no PSA has been doing better.  Not sure what to make of the lesions on the spleen but outpatient follow-up would be appropriate not sure what to make up the retroperitoneal adenopathy with questionable necrosis.  Patient's not toxic currently.  White count was up a little bit.  Close follow-up  with primary care doctor and urology as appropriate.  He can be discharged home    Benjamin Saur, MD 04/27/24 1933

## 2024-04-27 NOTE — Discharge Instructions (Signed)
 As we discussed, no emergent cause for your lower abdominal pain was identified. Please follow up with your doctor for further outpatient evaluation. The incidental findings on your CT scan should also be reviewed with your doctor to discuss further investigation.   Take Norco for pain, 1/2 to 1 tablet every 4 hours as needed. It is recommended that you take a laxative and/or stool softener with this to avoid constipation.   Return to the ED with any new or worsening symptoms.

## 2024-04-27 NOTE — Telephone Encounter (Signed)
Pt at ED now.  

## 2024-04-27 NOTE — Telephone Encounter (Signed)
 FYI Only or Action Required?: FYI only for provider.  Patient was last seen in primary care on 03/16/2024 by Almarie Waddell NOVAK, NP.  Called Nurse Triage reporting Abdominal Pain.  Symptoms began today.  Interventions attempted: OTC medications: pepto.  Symptoms are: gradually improving.  Triage Disposition: Go to ED Now (Notify PCP)  Patient/caregiver understands and will follow disposition?: No   Pt refused ER. CAL notified.     Copied from CRM [id number]. Topic: Clinical - Red Word Triage >> Apr 27, 2024 12:47 PM Harlene ORN wrote: Red Word that prompted transfer to Nurse Triage: woke up 3:30 this morning with a pain in his stomach. After taking two pepto tablets and a sleeve of crackers, but the pain is still persisting in his left side. No nausea and no fever. Reason for Disposition  [1] SEVERE pain (e.g., excruciating) AND [2] present > 1 hour  [1] SEVERE pain AND [2] age > 60 years  Answer Assessment - Initial Assessment Questions Pt states pain woke him up at 330 this am. He took 2 pepto and no improvement. Tookd 2 more pepto about an hour ago and ate some crackers. He states he had chicken salad yesterday that might be questionable. States BM this am was more on constipated side.    1. LOCATION: Where does it hurt?      Left side lower 2. RADIATION: Does the pain shoot anywhere else? (e.g., chest, back)     no 3. ONSET: When did the pain begin? (Minutes, hours or days ago)      330am today 4. SUDDEN: Gradual or sudden onset?     sudden 5. PATTERN Does the pain come and go, or is it constant?     constant 6. SEVERITY: How bad is the pain?  (e.g., Scale 1-10; mild, moderate, or severe)     4 right now, up to a 7 this am.  7. RECURRENT SYMPTOM: Have you ever had this type of stomach pain before? If Yes, ask: When was the last time? and What happened that time?      No  8. CAUSE: What do you think is causing the stomach pain? (e.g., gallstones, recent  abdominal surgery)     unknown 9. RELIEVING/AGGRAVATING FACTORS: What makes it better or worse? (e.g., antacids, bending or twisting motion, bowel movement)     Persistent. Slightly better  10. OTHER SYMPTOMS: Do you have any other symptoms? (e.g., back pain, diarrhea, fever, urination pain, vomiting)       No nausea, no fever, no  Protocols used: Abdominal Pain - Male-A-AH

## 2024-04-27 NOTE — ED Provider Notes (Signed)
 [facility] Provider Note   CSN: [id number] Arrival date & time: 04/27/24  1537     Patient presents with: Abdominal Pain   DONTEE JASO Azizi is a 83 y.o. male.   Patient to ED for evaluation of abdominal pain that woke him around 3:30 am this morning and has been constant since. He reports it was more intense at onset but continues to be painful. No nausea, vomiting. No diarrhea. He feels he may be constipated because he has not had a bowel movement in 2 days, but also reports just getting over an URI that lasted about 5 weeks during which his appetite was significantly decreased. No fever. No blood per rectum. No urinary symptoms, groin pain or flank pain.  The history is provided by the patient. No language interpreter was used.  Abdominal Pain      Prior to Admission medications   Medication Sig Start Date End Date Taking? Authorizing Provider  HYDROcodone -acetaminophen  (NORCO/VICODIN) 5-325 MG tablet Take 0.5-1 tablets by mouth every 4 (four) hours as needed. 04/27/24  Yes Captain Blucher, Margit, PA-C  amoxicillin  (AMOXIL ) 500 MG capsule TAKE 4 CAPSULES BY MOUTH AS NEEDED FOR DENTAL PROCEDURE 04/01/22   Paz, Jose E, MD  aspirin  325 MG EC tablet Take 325 mg by mouth daily.    [provider]  atorvastatin  (LIPITOR) 40 MG tablet Take 1 tablet (40 mg total) by mouth daily. 05/19/23   Nishan, Peter C, MD  azelastine  (ASTELIN ) 0.1 % nasal spray Place 2 sprays into both nostrils 2 (two) times daily. Use in each nostril as directed 03/16/24   Almarie Waddell NOVAK, NP  benzonatate  (TESSALON ) 200 MG capsule Take 1 capsule (200 mg total) by mouth 3 (three) times daily as needed. 03/22/24 03/22/25  Parrett, Madelin RAMAN, NP  celecoxib  (CELEBREX ) 100 MG capsule Take 1 capsule (100 mg total) by mouth 2 (two) times daily as needed for moderate pain. 08/30/19   Paz, Jose E, MD  cetirizine (ZYRTEC) 10 MG tablet Take 10 mg by mouth daily.    [provider]   ezetimibe  (ZETIA ) 10 MG tablet TAKE 1 TABLET BY MOUTH EVERY DAY 01/06/24   Paz, Jose E, MD  fluticasone  (FLONASE ) 50 MCG/ACT nasal spray Place 2 sprays into both nostrils daily. 03/16/24   Almarie Waddell NOVAK, NP  hydrocortisone  (ANUSOL -HC) 25 MG suppository Place 1 suppository (25 mg total) rectally 2 (two) times daily as needed for hemorrhoids. 09/22/23   Paz, Jose E, MD  metoprolol  succinate (TOPROL -XL) 25 MG 24 hr tablet Take 1 tablet (25 mg total) by mouth daily. 07/16/23   Nishan, Peter C, MD  Multiple Vitamin (MULTIVITAMIN) capsule Take 1 capsule by mouth daily.    [provider]  nitroGLYCERIN  (NITROSTAT ) 0.4 MG SL tablet Place 1 tablet (0.4 mg total) under the tongue every 5 (five) minutes as needed. 11/26/18   Delford Maude BROCKS, MD  omeprazole  (PRILOSEC) 20 MG capsule Take 1 capsule (20 mg total) by mouth daily before breakfast. 11/23/23   Paz, Jose E, MD  predniSONE  (DELTASONE ) 20 MG tablet Take 1 tablet (20 mg total) by mouth daily with breakfast. 03/22/24   Parrett, Madelin RAMAN, NP    Allergies: Short ragweed pollen ext    Review of Systems  Gastrointestinal:  Positive for abdominal pain.    Updated Vital Signs BP 130/74   Pulse 62   Temp 98.1 F (36.7 C) (Oral)   Resp 18   Ht 5' 9 (1.753 m)  Wt 82.6 kg   SpO2 97%   BMI 26.88 kg/m   Physical Exam Vitals and nursing note reviewed.  Constitutional:      Appearance: He is well-developed.  HENT:     Head: Normocephalic.  Cardiovascular:     Rate and Rhythm: Normal rate.  Pulmonary:     Effort: Pulmonary effort is normal.  Abdominal:     General: There is no distension.     Palpations: Abdomen is soft.     Tenderness: There is no abdominal tenderness. There is no right CVA tenderness, left CVA tenderness, guarding or rebound.  Musculoskeletal:        General: Normal range of motion.     Cervical back: Normal range of motion and neck supple.  Skin:    General: Skin is warm and dry.  Neurological:     General: No focal  deficit present.     Mental Status: He is alert and oriented to person, place, and time.     (all labs ordered are listed, but only abnormal results are displayed) Labs Reviewed  COMPREHENSIVE METABOLIC PANEL WITH GFR - Abnormal; Notable for the following components:      Result Value   Sodium 131 (*)    Chloride 96 (*)    Glucose, Bld 135 (*)    All other components within normal limits  URINALYSIS, ROUTINE W REFLEX MICROSCOPIC - Abnormal; Notable for the following components:   APPearance HAZY (*)    Hgb urine dipstick TRACE (*)    All other components within normal limits  CBC WITH DIFFERENTIAL/PLATELET - Abnormal; Notable for the following components:   WBC 12.0 (*)    Hemoglobin 12.5 (*)    HCT 36.9 (*)    Neutro Abs 9.4 (*)    All other components within normal limits  URINALYSIS, MICROSCOPIC (REFLEX) - Abnormal; Notable for the following components:   Bacteria, UA RARE (*)    All other components within normal limits  LIPASE, BLOOD    EKG: None  Radiology: CT ABDOMEN PELVIS W CONTRAST Result Date: 04/27/2024 CLINICAL DATA:  Lower abdominal pain. EXAM: CT ABDOMEN AND PELVIS WITH CONTRAST TECHNIQUE: Multidetector CT imaging of the abdomen and pelvis was performed using the standard protocol following bolus administration of intravenous contrast. RADIATION DOSE REDUCTION: This exam was performed according to the departmental dose-optimization program which includes automated exposure control, adjustment of the mA and/or kV according to patient size and/or use of iterative reconstruction technique. CONTRAST:  100mL OMNIPAQUE IOHEXOL 300 MG/ML  SOLN COMPARISON:  None Available. FINDINGS: Lower chest: Mild cardiomegaly. Coronary artery calcifications. Subsegmental atelectasis or scarring within both lower lobes and lingula. Hepatobiliary: Mild diffuse hepatic steatosis. No evidence of focal liver lesion. Small gallstones within physiologically distended gallbladder. No  pericholecystic inflammation. Pancreas: No ductal dilatation or inflammation. There is no obvious pancreatic mass. Spleen: Lobulated low-density lesions involving the anterior and inferior spleen, appear slightly coalescent with indistinct margins. Some of these are partially exophytic, and approach the lateral aspect of the stomach. Central low density may represent necrosis. For example 5 cm lesion near the hilum series 2, image 21. Some peripheral area of low-density inferiorly may represent infarct or additional lesions. There is adjacent fat stranding and inflammation. The splenic vein appears patent. Adrenals/Urinary Tract: Definite adrenal nodule. Lobulated low-density abuts the left adrenal gland measuring 2.4 cm, but is favored to represent adenopathy rather than a discrete adrenal nodule. No hydronephrosis. No renal calculi. No evidence of focal renal lesion. Partially distended.  Bladder courses into the right pelvis with mild diffuse bladder wall thickening. No perivesicular fat stranding. Stomach/Bowel: Splenic flexure of the colon approaches the abnormal spleen but fat plane is present, no definite continuity. The splenic flexure of the colon is decompressed and not well assessed. The descending colon is redundant. Colonic diverticulosis is most prominent in the left colon, no diverticulitis. Small to moderate volume of colonic stool. High-riding cecum in the right mid abdomen. The appendix is not confidently visualized. No small bowel obstruction or wall thickening. The stomach is not well assessed, nondistended, equivocal wall thickening. There is a small to moderate hiatal hernia. Vascular/Lymphatic: Abdominal aortic atherosclerosis. No aortic aneurysm. The portal and splenic vein is patent. Left upper retroperitoneal adenopathy includes a 2 cm short axis node, series 2, image 23, with low-density suggestive of necrosis. There is a probable necrotic node near the hilum series 2, image 21. Additional  small retroperitoneal nodes at the level of the left kidney. Reproductive: Prominent prostatic enlargement, 6 cm transverse. This causes mass effect on the bladder base. Other: Tacks in the lower abdominal wall from prior hernia repair. No evidence of recurrent hernia. Strandy density in the left upper quadrant. No free air. Musculoskeletal: Lucency within the inferior aspect of L2 vertebra may represent a Schmorl's node, however lucent lesion is also considered. There is diffuse degenerative change throughout the spine. No evidence of intramuscular collection. IMPRESSION: 1. Lobulated low-density lesions involving the anterior and inferior spleen, appear slightly coalescent with indistinct margins. Central low density may represent necrosis. There is adjacent fat stranding and inflammation. Differential considerations include neoplasm, either primary or metastatic, or infection. 2. Equivocal gastric wall thickening, some of the splenic lesions are exophytic and approach the lateral aspect of the stomach. Consider endoscopy. 3. Left upper retroperitoneal adenopathy, likely necrotic. 4. Lucency within the inferior aspect of L2 vertebra may represent a Schmorl's node, however lucent lesion is also considered. 5. Colonic diverticulosis without diverticulitis. 6. Cholelithiasis without gallbladder inflammation. 7. Hepatic steatosis. 8. Small to moderate hiatal hernia. 9. Prominent prostatic enlargement, causing mass effect on the bladder base. Mild diffuse bladder wall thickening may be due to chronic bladder outlet obstruction. Aortic Atherosclerosis (ICD10-I70.0). Electronically Signed   By: Andrea Gasman M.D.   On: 04/27/2024 19:12     Procedures   Medications Ordered in the ED  iohexol (OMNIPAQUE) 300 MG/ML solution 100 mL (100 mLs Intravenous Contrast Given 04/27/24 1838)    Clinical Course as of 04/27/24 2121  Wed Apr 27, 2024  1814 Patient to ED with c/o abdominal pain across mid-abdomen. No nausea  or vomiting. Very well appearing, in NAD. Labs significant for mild leukocytosis of 12.0, hgb 12.5, Na 131, Cl 96. Other labs considered WNL. UA pending. Will obtain CT abd/pel - no history of CT in the past, persistent pain in 83 yo patient. He declines pain medications for now.  [SU]  2045 CT abd/pel, per radiology:  IMPRESSION: 1. Lobulated low-density lesions involving the anterior and inferior spleen, appear slightly coalescent with indistinct margins. Central low density may represent necrosis. There is adjacent fat stranding and inflammation. Differential considerations include neoplasm, either primary or metastatic, or infection. 2. Equivocal gastric wall thickening, some of the splenic lesions are exophytic and approach the lateral aspect of the stomach. Consider endoscopy. 3. Left upper retroperitoneal adenopathy, likely necrotic. 4. Lucency within the inferior aspect of L2 vertebra may represent a Schmorl's node, however lucent lesion is also considered. 5. Colonic diverticulosis without diverticulitis. 6. Cholelithiasis without  gallbladder inflammation. 7. Hepatic steatosis. 8. Small to moderate hiatal hernia. 9. Prominent prostatic enlargement, causing mass effect on the bladder base. Mild diffuse bladder wall thickening may be due to chronic bladder outlet obstruction.  These findings were reviewed with Dr. Zackowski, ED attending, and with the patient. Felt to be incidental findings and unrelated to the lower abdominal pain he presented with. He can be discharged home strongly encouraging PCP follow up to review abnormal findings and further outpatient evaluation. Pain management provided.  [SU]  2119 No cause for his lower abdominal pain was identified. Need for outpatient follow up for same was also discussed. Pain medication provided with caution to use stool softeners to avoid constipation. Return precautions discussed.  [SU]    Clinical Course User Index [SU] Odell Balls, PA-C                                 Medical Decision Making Amount and/or Complexity of Data Reviewed Labs: ordered. Radiology: ordered.  Risk Prescription drug management.        Final diagnoses:  Lower abdominal pain  Abnormal CT of the abdomen    ED Discharge Orders          Ordered    HYDROcodone -acetaminophen  (NORCO/VICODIN) 5-325 MG tablet  Every 4 hours PRN        04/27/24 2015               Odell Balls, PA-C 04/27/24 2122    Zackowski, Scott, MD 04/28/24 1907

## 2024-05-03 ENCOUNTER — Ambulatory Visit: Payer: Self-pay | Admitting: Internal Medicine

## 2024-05-03 NOTE — Telephone Encounter (Signed)
 Noted

## 2024-05-03 NOTE — Telephone Encounter (Signed)
 FYI Only or Action Required?: Action required by provider: update on patient condition.  Patient was last seen in primary care on 03/16/2024 by Almarie Waddell NOVAK, NP.  Called Nurse Triage reporting Constipation.  Symptoms began several days ago.  Triage Disposition: See Physician Within 24 Hours  Patient/caregiver understands and will follow disposition?: No           Copied from CRM #[id number]. Topic: Clinical - Red Word Triage >> May 03, 2024  4:29 PM Shereese L wrote: [facility] that prompted transfer to Nurse Triage: Patient stated hes constipated with pains and it has been 4 days since he's has had bowl movement Reason for Disposition  Last bowel movement (BM) > 4 days ago  Answer Assessment - Initial Assessment Questions This RN recommends pt is seen within 24 hours but pt declined. Pt is wondering if he can take another dulcolax. This RN educated him on home health advice as below:  STEP 1 - USE AN OSMOTIC LAXATIVE: * You can take polyethylene glycol 3350 ( MIRALAX)  STEP 2 - USE A STIMULANT LAXATIVE - SUPPOSITORY: * If the constipation does not get better with the Care Advice in Step 1, add a stimulant laxative. * Use either BISACODYL  (Dulcolax) or a GLYCERIN SUPPOSITORY. * Before using any medicine, read all the instructions on the package.  Pt states he is going to go to the drug store to pick up some Miralax and try that first as instructed. This RN educated pt on home care, new-worsening symptoms, when to call back/seek emergent care. Pt verbalized understanding and agrees to plan.   Constipation Last bm was four days ago Pt had this happen last week and went to ED; pt had imaging with no acute issues; pt picked up OTC dulcolax and within 10 hours pt states it looked like he had an impact as he cleared his colon   Pt states he is passing gas, not too often Denies severe abdominal pain, distended abdomen, vomiting  STOOL PATTERN OR FREQUENCY: How often do you have a  bowel movement (BM)?  (Normal range: 3 times a day to every 3 days)  When was your last BM?       Once a day      RECTAL PAIN: Does your rectum hurt when the stool comes out? If Yes, ask: Do you have hemorrhoids? How bad is the pain?  (Scale 1-10; or mild, moderate, severe)     Denies rectal pain; stomach pain intermittent   CHANGES IN DIET OR HYDRATION: Have there been any recent changes in your diet? How much fluids are you drinking on a daily basis?  How much have you had to drink today?     Pt states he had a virus a few weeks ago where he was coughing for a few days which resulted in a loss of appetite  MEDICINES: Have you been taking any new medicines? Are you taking any narcotic pain medicines? (e.g., Dilaudid , morphine , Percocet, Vicodin)     No  Protocols used: Constipation-A-AH

## 2024-05-04 ENCOUNTER — Telehealth: Payer: Self-pay

## 2024-05-04 NOTE — Telephone Encounter (Signed)
 Copied from CRM [id number]. Topic: Appointments - Scheduling Inquiry for Clinic >> May 04, 2024  9:10 AM Anairis L wrote: Reason for CRM: Patient was in ER 04/27/2024 and the informed him abnormal radiological finding.Next app for Dr. Nile is Jan.Can someone please assist scheduling sooner.

## 2024-05-04 NOTE — Telephone Encounter (Signed)
 Appt scheduled 05/13/24 w/ Padonda.

## 2024-05-04 NOTE — Telephone Encounter (Signed)
 PCP has modified/limited schedule until 05/23/24, he will then be going out on surgery leave for 6 weeks. Can you set him up with Padonda on Friday, 05/06/24 please?

## 2024-05-06 ENCOUNTER — Other Ambulatory Visit: Payer: Self-pay | Admitting: Internal Medicine

## 2024-05-13 ENCOUNTER — Ambulatory Visit: Admitting: Family

## 2024-05-13 VITALS — BP 134/78 | HR 63 | Temp 98.0°F | Resp 16 | Ht 69.0 in | Wt 184.8 lb

## 2024-05-13 DIAGNOSIS — D7389 Other diseases of spleen: Secondary | ICD-10-CM

## 2024-05-13 DIAGNOSIS — R1900 Intra-abdominal and pelvic swelling, mass and lump, unspecified site: Secondary | ICD-10-CM | POA: Diagnosis not present

## 2024-05-13 DIAGNOSIS — R6881 Early satiety: Secondary | ICD-10-CM | POA: Diagnosis not present

## 2024-05-13 MED ORDER — OMEPRAZOLE 40 MG PO CPDR: 40.0000 mg | DELAYED_RELEASE_CAPSULE | Freq: Every day | ORAL | 3 refills | Status: DC

## 2024-05-15 ENCOUNTER — Encounter: Payer: Self-pay | Admitting: Family

## 2024-05-15 NOTE — Progress Notes (Signed)
 Acute Office Visit  Subjective:     Patient ID: Benjamin Adkins, male    DOB: March 20, 1941, 83 y.o.   MRN: [id number]  Chief Complaint  Patient presents with   ER follow up    Pt seen in the ED on 10/08 for abdominal pain. Pt would like discuss imaging results    HPI Patient is a 83 year old patient of Dr. Amon who present to the ED on 04/27/24 with lower abdominal pain that has since resolved. However, he had a CT scan that required follow-up and recommended referral to GI for a unspecified lesion in the spleen. An endoscopy was also recommended for gastric wall thickening that was identified. Patient does report 17 lb weight loss and decreased appetitie. Patient is concerned and would like to proceed with additional imaging for clarification. He currently declines referral to GI until he has his MRI. Denies any pain.   Patient also reports a cough. He has known reflux and is unsure if his Omeprazole  is helping heartburn and indigestion at this point.   CT scan from 04/27/24: ED Visit  1. Lobulated low-density lesions involving the anterior and inferior spleen, appear slightly coalescent with indistinct margins. Central low density may represent necrosis. There is adjacent fat stranding and inflammation. Differential considerations include neoplasm, either primary or metastatic, or infection. 2. Equivocal gastric wall thickening, some of the splenic lesions are exophytic and approach the lateral aspect of the stomach. Consider endoscopy. 3. Left upper retroperitoneal adenopathy, likely necrotic. 4. Lucency within the inferior aspect of L2 vertebra may represent a Schmorl's node, however lucent lesion is also considered. 5. Colonic diverticulosis without diverticulitis. 6. Cholelithiasis without gallbladder inflammation. 7. Hepatic steatosis. 8. Small to moderate hiatal hernia. 9. Prominent prostatic enlargement, causing mass effect on the bladder base. Mild diffuse bladder wall  thickening may be due to chronic bladder outlet obstruction.  Review of Systems  Constitutional: Negative.   Respiratory:  Positive for cough. Negative for sputum production and shortness of breath.   Cardiovascular: Negative.   Gastrointestinal:  Positive for heartburn. Negative for abdominal pain, blood in stool and constipation.  Genitourinary: Negative.   Musculoskeletal: Negative.   Neurological: Negative.   Psychiatric/Behavioral: Negative.    All other systems reviewed and are negative.  Past Medical History:  Diagnosis Date   Arthritis    Coronary atherosclerosis of unspecified type of vessel, native or graft    MI 2006, h/o CABG   DJD (degenerative joint disease)    Elevated prostate specific antigen (PSA)    Elevated PSA    Esophageal reflux    GERD (gastroesophageal reflux disease)    HTN (hypertension)    Hyperlipidemia    nmr lipoprofile 2004: LDL 154 (1653/711), HDL 42, TG 80.NMR  LDL =,125, but PMH  of MI LDL goal=,70   Hypertension    Myocardial infarction [facility])    2006   Other cataract    Personal history of colonic polyps    Shortness of breath    DOE   Sleep apnea    uses CPAP    Social History   Socioeconomic History   Marital status: Widowed    Spouse name: Not on file   Number of children: 1   Years of education: Not on file   Highest education level: Master's degree (e.g., MA, MS, MEng, MEd, MSW, MBA)  Occupational History   Occupation: retired  Tobacco Use   Smoking status: Former    Current packs/day: 0.00  Average packs/day: 0.3 packs/day for 3.0 years (0.8 ttl pk-yrs)    Types: Cigarettes    Start date: 04/12/1959    Quit date: 08/12/1961    Years since quitting: 62.8   Smokeless tobacco: Never   Tobacco comments:    during college  Vaping Use   Vaping status: Never Used  Substance and Sexual Activity   Alcohol use: Yes    Alcohol/week: 1.0 standard drink of alcohol    Types: 1 Glasses of wine per week    Comment: with dinner  occ   Drug use: No   Sexual activity: Yes  Other Topics Concern   Not on file  Social History Narrative   Lives alone.    Has a daughter in [place] , [place] G-son   Daughter: [name] [phone]   Social Drivers of Health   Financial Resource Strain: Low Risk  (05/13/2024)   Overall Financial Resource Strain (CARDIA)    Difficulty of Paying Living Expenses: Not hard at all  Food Insecurity: No Food Insecurity (05/13/2024)   Hunger Vital Sign    Worried About Running Out of Food in the Last Year: Never true    Ran Out of Food in the Last Year: Never true  Transportation Needs: No Transportation Needs (05/13/2024)   PRAPARE - Administrator, Civil Service (Medical): No    Lack of Transportation (Non-Medical): No  Physical Activity: Sufficiently Active (05/13/2024)   Exercise Vital Sign    Days of Exercise per Week: 4 days    Minutes of Exercise per Session: 40 min  Stress: No Stress Concern Present (05/13/2024)   [employer] of Occupational Health - Occupational Stress Questionnaire    Feeling of Stress: Only a little  Social Connections: Moderately Integrated (05/13/2024)   Social Connection and Isolation Panel    Frequency of Communication with Friends and Family: Three times a week    Frequency of Social Gatherings with Friends and Family: Three times a week    Attends Religious Services: More than 4 times per year    Active Member of Clubs or Organizations: Yes    Attends Banker Meetings: More than 4 times per year    Marital Status: Widowed  Intimate Partner Violence: Not At Risk (03/07/2024)   Humiliation, Afraid, Rape, and Kick questionnaire    Fear of Current or Ex-Partner: No    Emotionally Abused: No    Physically Abused: No    Sexually Abused: No    Past Surgical History:  Procedure Laterality Date   CARDIAC CATHETERIZATION     2006   CATARACT EXTRACTION Bilateral    COLONOSCOPY W/ POLYPECTOMY     CORONARY ARTERY BYPASS GRAFT   2006   X 4    EYE SURGERY     HERNIA REPAIR     VENTRAL, LEFT & RIGHT GROIN   TOTAL KNEE ARTHROPLASTY  2006   TOTAL KNEE ARTHROPLASTY  08/25/2011   Procedure: TOTAL KNEE ARTHROPLASTY;  Surgeon: Lamar DELENA Millman, MD;  Location: [facility] OR;  Service: Orthopedics;  Laterality: Right;  DR MILLMAN CHRISTIAN 90 MINUTES FOR SURGERY    Family History  Problem Relation Age of Onset   Hypertension Mother    Breast cancer Mother    Coronary artery disease Mother    Heart disease Mother    Arthritis Mother    Cancer Mother    Stomach cancer Father    Breast cancer Sister    Crohn's disease Sister  Arthritis Sister    Asthma Sister        childhood   Arthritis Brother    Heart disease Brother    Heart attack Paternal Grandfather 61   Colon cancer Neg Hx    Prostate cancer Neg Hx     Allergies  Allergen Reactions   Short Ragweed Pollen Ext     Current Outpatient Medications on File Prior to Visit  Medication Sig Dispense Refill   amoxicillin  (AMOXIL ) 500 MG capsule TAKE 4 CAPSULES BY MOUTH AS NEEDED FOR DENTAL PROCEDURE 20 capsule 0   aspirin  325 MG EC tablet Take 325 mg by mouth daily.     atorvastatin  (LIPITOR) 40 MG tablet Take 1 tablet (40 mg total) by mouth daily. 90 tablet 3   azelastine  (ASTELIN ) 0.1 % nasal spray Place 2 sprays into both nostrils 2 (two) times daily. Use in each nostril as directed 30 mL 12   benzonatate  (TESSALON ) 200 MG capsule Take 1 capsule (200 mg total) by mouth 3 (three) times daily as needed. 45 capsule 1   celecoxib  (CELEBREX ) 100 MG capsule Take 1 capsule (100 mg total) by mouth 2 (two) times daily as needed for moderate pain. 180 capsule 1   cetirizine (ZYRTEC) 10 MG tablet Take 10 mg by mouth daily.     ezetimibe  (ZETIA ) 10 MG tablet TAKE 1 TABLET BY MOUTH EVERY DAY 90 tablet 1   fluticasone  (FLONASE ) 50 MCG/ACT nasal spray Place 2 sprays into both nostrils daily. 16 g 6   HYDROcodone -acetaminophen  (NORCO/VICODIN) 5-325 MG tablet Take 0.5-1 tablets by  mouth every 4 (four) hours as needed. 12 tablet 0   hydrocortisone  (ANUSOL -HC) 25 MG suppository Place 1 suppository (25 mg total) rectally 2 (two) times daily as needed for hemorrhoids. 12 suppository 1   metoprolol  succinate (TOPROL -XL) 25 MG 24 hr tablet Take 1 tablet (25 mg total) by mouth daily. 90 tablet 2   Multiple Vitamin (MULTIVITAMIN) capsule Take 1 capsule by mouth daily.     nitroGLYCERIN  (NITROSTAT ) 0.4 MG SL tablet Place 1 tablet (0.4 mg total) under the tongue every 5 (five) minutes as needed. 25 tablet 3   No current facility-administered medications on file prior to visit.    BP 134/78 (BP Location: Left Arm, Patient Position: Sitting, Cuff Size: Normal)   Pulse 63   Temp 98 F (36.7 C) (Oral)   Resp 16   Ht 5' 9 (1.753 m)   Wt 184 lb 12.8 oz (83.8 kg)   SpO2 97%   BMI 27.29 kg/m chart      Objective:    BP 134/78 (BP Location: Left Arm, Patient Position: Sitting, Cuff Size: Normal)   Pulse 63   Temp 98 F (36.7 C) (Oral)   Resp 16   Ht 5' 9 (1.753 m)   Wt 184 lb 12.8 oz (83.8 kg)   SpO2 97%   BMI 27.29 kg/m    Physical Exam Vitals and nursing note reviewed.  Constitutional:      Appearance: Normal appearance. He is normal weight.  Cardiovascular:     Rate and Rhythm: Normal rate and regular rhythm.     Pulses: Normal pulses.     Heart sounds: Normal heart sounds.  Pulmonary:     Effort: Pulmonary effort is normal.     Breath sounds: Normal breath sounds.  Abdominal:     General: Abdomen is flat. Bowel sounds are normal.     Palpations: Abdomen is soft.  Musculoskeletal:  General: Normal range of motion.     Cervical back: Normal range of motion and neck supple.  Skin:    General: Skin is warm and dry.  Neurological:     General: No focal deficit present.     Mental Status: He is alert and oriented to person, place, and time. Mental status is at baseline.  Psychiatric:        Mood and Affect: Mood normal.        Behavior: Behavior  normal.        Thought Content: Thought content normal.     No results found for any visits on 05/13/24.      Assessment & Plan:   Khole was seen today for er follow up.  Diagnoses and all orders for this visit:  Early satiety  Lesion of spleen  Intra-abdominal and pelvic swelling, mass and lump, unspecified site -     MR ABDOMEN WO CONTRAST; Future -     MR Abdomen W Wo Contrast; Future  Other orders -     omeprazole  (PRILOSEC) 40 MG capsule; Take 1 capsule (40 mg total) by mouth daily.      Omeprazole  increased to 40 mg. Hopefully this will help the cough. Referral for MRI placed to help identify the lesion in the spleen. Patient would like to defer GI referral until the MRI is completed. Call the office if symptoms worsen or persist. Recheck as scheduled and sooner as needed. MRI of the abdomen has been ordered with and without contrast to evaluate the mass.  No follow-ups on file.  Josefa [place] B Santana Gosdin, FNP

## 2024-05-24 ENCOUNTER — Ambulatory Visit (HOSPITAL_BASED_OUTPATIENT_CLINIC_OR_DEPARTMENT_OTHER)
Admission: RE | Admit: 2024-05-24 | Discharge: 2024-05-24 | Disposition: A | Discharge status: Home / Self Care | Source: Ambulatory Visit | Attending: Family | Admitting: Family

## 2024-05-24 DIAGNOSIS — R1900 Intra-abdominal and pelvic swelling, mass and lump, unspecified site: Secondary | ICD-10-CM | POA: Diagnosis not present

## 2024-05-24 DIAGNOSIS — R590 Localized enlarged lymph nodes: Secondary | ICD-10-CM | POA: Diagnosis not present

## 2024-05-24 DIAGNOSIS — K8020 Calculus of gallbladder without cholecystitis without obstruction: Secondary | ICD-10-CM | POA: Diagnosis not present

## 2024-05-24 MED ORDER — GADOBUTROL 1 MMOL/ML IV SOLN
8.3000 mL | Freq: Once | INTRAVENOUS | Status: AC | PRN
  Administered 2024-05-24: 8.3 mL via INTRAVENOUS

## 2024-05-25 ENCOUNTER — Other Ambulatory Visit: Payer: Self-pay | Admitting: Family

## 2024-05-25 ENCOUNTER — Ambulatory Visit: Payer: Self-pay | Admitting: Family

## 2024-05-25 DIAGNOSIS — D7389 Other diseases of spleen: Secondary | ICD-10-CM

## 2024-05-26 ENCOUNTER — Encounter: Payer: Self-pay | Admitting: Medical Oncology

## 2024-05-26 NOTE — Progress Notes (Signed)
 [facility] [phone]   Fax:([phone]  INITIAL CONSULTATION:  Patient Care Team: Amon Aloysius BRAVO, MD as PCP - General (Internal Medicine) Delford Maude BROCKS, MD as PCP - Cardiology (Cardiology) Timmy Maude SAUNDERS, MD as Consulting Physician (Oncology) Chauncey Redell Agent, MD as Consulting Physician (Urology) Golden Forestine BROCKS, RN as Oncology Nurse Navigator (Medical Oncology)  CHIEF COMPLAINTS/PURPOSE OF CONSULTATION:  Splenic mass  HISTORY OF PRESENTING ILLNESS:  Benjamin Adkins 83 y.o. male with medical history significant for essential hypertension, MI, right bundle branch block, obstructive sleep apnea, GERD, elevated prostate specific antigen, hyperglycemia.  On review of the previous records patient is being referred by PCP Kenney Roys FNP. Patient was first seen in the ED on 04/27/2024 for abdominal pain and had CT AP showing multiple findings including lobulated low-density lesions involving the anterior and inferior spleen, appear slightly coalescent with indistinct margins, central low density and adjacent fat stranding and inflammation.  Patient followed up with PCP on 05/09/2024 to further discuss CT abdomen pelvis results. MRI abdomen was subsequently ordered by PCP and performed on 05/24/2024 showing lobulated large splenic mass with associated retroperitoneal/retrocrural lymphadenopathy and possible left adrenal involvement.  Radiologist commented on differential including malignancy such as lymphoma versus metastasis versus angiosarcoma.   On exam today patient is accompanied by his daughter who provides additional history. He reports a significant weight loss of approximately 19 pounds over the past few months, starting around August, coinciding with a loss of appetite. He attributes the initial loss of appetite to a respiratory illness contracted after visiting an indoor venue with his grandson. This illness led to a  persistent cough, for which he saw pulmonology for 03/22/24 and was prescribed steroid pulse and was recommended Delsym, Tessalon  Perles and Pepcid at bedtime for 2-3 weeks to help reflux symptoms. Patient reports that significantly helped his cough. He continues to experience a lingering cough, which he associates with a known hiatal hernia. No nausea, vomiting, fever, or night sweats. He reports normal bowel movements, though less frequent due to reduced food intake. He experiences intermittent abdominal pain, particularly after eating, but does not take any medication for it as it is manageable. He denies any significant back pain, except when engaging in physical activities like raking leaves. Patient denies any recent travel.  Family history includes his mother and sister having breast cancer and father had stomach cancer. He does not smoke and socially consumes alcohol. He is retired, previous working as a designer, fashion/clothing. His last colonoscopy was in 2017 and he decided with GI provider discontinue these based on his age. His PSA was last checked 04/26/24 and was 4.52.  MEDICAL HISTORY:  Past Medical History:  Diagnosis Date   Arthritis    Coronary atherosclerosis of unspecified type of vessel, native or graft    MI 2006, h/o CABG   DJD (degenerative joint disease)    Elevated prostate specific antigen (PSA)    Elevated PSA    Esophageal reflux    GERD (gastroesophageal reflux disease)    HTN (hypertension)    Hyperlipidemia    nmr lipoprofile 2004: LDL 154 (1653/711), HDL 42, TG 80.NMR  LDL =,125, but PMH  of MI LDL goal=,70   Hypertension    Myocardial infarction [facility])    2006   Other cataract    Personal history of colonic polyps    Shortness of breath    DOE   Sleep apnea    uses CPAP  SURGICAL HISTORY: Past Surgical History:  Procedure Laterality Date   CARDIAC CATHETERIZATION     2006   CATARACT EXTRACTION Bilateral    COLONOSCOPY W/ POLYPECTOMY     CORONARY  ARTERY BYPASS GRAFT  2006   X 4    EYE SURGERY     HERNIA REPAIR     VENTRAL, LEFT & RIGHT GROIN   TOTAL KNEE ARTHROPLASTY  2006   TOTAL KNEE ARTHROPLASTY  08/25/2011   Procedure: TOTAL KNEE ARTHROPLASTY;  Surgeon: Lamar DELENA Millman, MD;  Location: [facility] OR;  Service: Orthopedics;  Laterality: Right;  DR MILLMAN CHRISTIAN 90 MINUTES FOR SURGERY    SOCIAL HISTORY: Social History   Socioeconomic History   Marital status: Widowed    Spouse name: Not on file   Number of children: 1   Years of education: Not on file   Highest education level: Master's degree (e.g., MA, MS, MEng, MEd, MSW, MBA)  Occupational History   Occupation: retired  Tobacco Use   Smoking status: Former    Current packs/day: 0.00    Average packs/day: 0.3 packs/day for 3.0 years (0.8 ttl pk-yrs)    Types: Cigarettes    Start date: 04/12/1959    Quit date: 08/12/1961    Years since quitting: 62.8   Smokeless tobacco: Never   Tobacco comments:    during college  Vaping Use   Vaping status: Never Used  Substance and Sexual Activity   Alcohol use: Yes    Alcohol/week: 1.0 standard drink of alcohol    Types: 1 Glasses of wine per week    Comment: with dinner occ   Drug use: No   Sexual activity: Yes  Other Topics Concern   Not on file  Social History Narrative   Lives alone.    Has a daughter in [place] , [place] G-son   Daughter: [name] [phone]   Social Drivers of Health   Financial Resource Strain: Low Risk  (05/13/2024)   Overall Financial Resource Strain (CARDIA)    Difficulty of Paying Living Expenses: Not hard at all  Food Insecurity: No Food Insecurity (05/26/2024)   Hunger Vital Sign    Worried About Running Out of Food in the Last Year: Never true    Ran Out of Food in the Last Year: Never true  Transportation Needs: No Transportation Needs (05/26/2024)   PRAPARE - Administrator, Civil Service (Medical): No    Lack of Transportation (Non-Medical): No  Physical Activity: Sufficiently  Active (05/13/2024)   Exercise Vital Sign    Days of Exercise per Week: 4 days    Minutes of Exercise per Session: 40 min  Stress: No Stress Concern Present (05/13/2024)   [employer] of Occupational Health - Occupational Stress Questionnaire    Feeling of Stress: Only a little  Social Connections: Moderately Integrated (05/13/2024)   Social Connection and Isolation Panel    Frequency of Communication with Friends and Family: Three times a week    Frequency of Social Gatherings with Friends and Family: Three times a week    Attends Religious Services: More than 4 times per year    Active Member of Clubs or Organizations: Yes    Attends Banker Meetings: More than 4 times per year    Marital Status: Widowed  Intimate Partner Violence: Not At Risk (03/07/2024)   Humiliation, Afraid, Rape, and Kick questionnaire    Fear of Current or Ex-Partner: No    Emotionally Abused: No  Physically Abused: No    Sexually Abused: No    FAMILY HISTORY: Family History  Problem Relation Age of Onset   Hypertension Mother    Breast cancer Mother    Coronary artery disease Mother    Heart disease Mother    Arthritis Mother    Cancer Mother    Stomach cancer Father    Breast cancer Sister    Crohn's disease Sister    Arthritis Sister    Asthma Sister        childhood   Arthritis Brother    Heart disease Brother    Heart attack Paternal Grandfather 60   Colon cancer Neg Hx    Prostate cancer Neg Hx     ALLERGIES:  is allergic to short ragweed pollen ext.  MEDICATIONS:  Current Outpatient Medications  Medication Sig Dispense Refill   amoxicillin  (AMOXIL ) 500 MG capsule TAKE 4 CAPSULES BY MOUTH AS NEEDED FOR DENTAL PROCEDURE 20 capsule 0   aspirin  325 MG EC tablet Take 325 mg by mouth daily.     atorvastatin  (LIPITOR) 40 MG tablet Take 1 tablet (40 mg total) by mouth daily. 90 tablet 3   azelastine  (ASTELIN ) 0.1 % nasal spray Place 2 sprays into both nostrils 2 (two)  times daily. Use in each nostril as directed 30 mL 12   benzonatate  (TESSALON ) 200 MG capsule Take 1 capsule (200 mg total) by mouth 3 (three) times daily as needed. 45 capsule 1   celecoxib  (CELEBREX ) 100 MG capsule Take 1 capsule (100 mg total) by mouth 2 (two) times daily as needed for moderate pain. 180 capsule 1   cetirizine (ZYRTEC) 10 MG tablet Take 10 mg by mouth daily.     ezetimibe  (ZETIA ) 10 MG tablet TAKE 1 TABLET BY MOUTH EVERY DAY 90 tablet 1   fluticasone  (FLONASE ) 50 MCG/ACT nasal spray Place 2 sprays into both nostrils daily. 16 g 6   HYDROcodone -acetaminophen  (NORCO/VICODIN) 5-325 MG tablet Take 0.5-1 tablets by mouth every 4 (four) hours as needed. 12 tablet 0   hydrocortisone  (ANUSOL -HC) 25 MG suppository Place 1 suppository (25 mg total) rectally 2 (two) times daily as needed for hemorrhoids. 12 suppository 1   metoprolol  succinate (TOPROL -XL) 25 MG 24 hr tablet Take 1 tablet (25 mg total) by mouth daily. 90 tablet 2   Multiple Vitamin (MULTIVITAMIN) capsule Take 1 capsule by mouth daily.     nitroGLYCERIN  (NITROSTAT ) 0.4 MG SL tablet Place 1 tablet (0.4 mg total) under the tongue every 5 (five) minutes as needed. 25 tablet 3   omeprazole  (PRILOSEC) 40 MG capsule Take 1 capsule (40 mg total) by mouth daily. 30 capsule 3   No current facility-administered medications for this visit.    REVIEW OF SYSTEMS:   All other systems are reviewed and are negative for acute change except as noted in the HPI.  PHYSICAL EXAMINATION: ECOG PERFORMANCE STATUS: 1 - Symptomatic but completely ambulatory  Vitals:   05/27/24 1239  BP: 135/76  Pulse: 73  Resp: 20  Temp: 97.9 F (36.6 C)  SpO2: 97%   Filed Weights   05/27/24 1239  Weight: 180 lb 12.8 oz (82 kg)    Physical Exam Vitals reviewed.  Constitutional:      Appearance: He is not ill-appearing or toxic-appearing.  HENT:     Head: Normocephalic and atraumatic.     Right Ear: External ear normal.     Left Ear: External  ear normal.     Nose: Nose normal.  Eyes:     General: No scleral icterus.    Conjunctiva/sclera: Conjunctivae normal.  Cardiovascular:     Rate and Rhythm: Normal rate and regular rhythm.     Pulses: Normal pulses.     Heart sounds: Normal heart sounds.  Pulmonary:     Effort: Pulmonary effort is normal. No respiratory distress.     Breath sounds: Normal breath sounds. No wheezing or rhonchi.  Abdominal:     General: There is no distension.     Palpations: Abdomen is soft.     Tenderness: There is no abdominal tenderness. There is no guarding or rebound.  Musculoskeletal:        General: Normal range of motion.     Cervical back: Normal range of motion.  Skin:    General: Skin is warm and dry.     Capillary Refill: Capillary refill takes less than 2 seconds.  Neurological:     Mental Status: He is alert and oriented to person, place, and time.  Psychiatric:        Mood and Affect: Mood normal.       LABORATORY DATA:  I have reviewed the data as listed    Latest Ref Rng & Units 05/27/2024    2:08 PM 04/27/2024    4:15 PM 08/25/2023   10:31 AM  CBC  WBC 4.0 - 10.5 K/uL 12.1  12.0  7.3   Hemoglobin 13.0 - 17.0 g/dL 86.6  87.4  85.6   Hematocrit 39.0 - 52.0 % 40.1  36.9  41.4   Platelets 150 - 400 K/uL 242  181  173.0        Latest Ref Rng & Units 05/27/2024    2:08 PM 04/27/2024    4:15 PM 08/25/2023   10:31 AM  CMP  Glucose 70 - 99 mg/dL 886  864  899   BUN 8 - 23 mg/dL 12  10  14    Creatinine 0.61 - 1.24 mg/dL 9.13  9.20  9.06   Sodium 135 - 145 mmol/L 137  131  137   Potassium 3.5 - 5.1 mmol/L 4.1  3.8  4.1   Chloride 98 - 111 mmol/L 99  96  103   CO2 22 - 32 mmol/L 30  22  27    Calcium  8.9 - 10.3 mg/dL 89.6  9.6  8.9   Total Protein 6.5 - 8.1 g/dL 8.2  7.1  6.6   Total Bilirubin 0.0 - 1.2 mg/dL 0.8  0.7  0.9   Alkaline Phos 38 - 126 U/L 120  121  98   AST 15 - 41 U/L 36  32  29   ALT 0 - 44 U/L 20  16  20       RADIOGRAPHIC STUDIES: I have personally  reviewed the radiological images as listed and agreed with the findings in the report. MR Abdomen W Wo Contrast Result Date: 05/25/2024 CLINICAL DATA:  Abdominal mass EXAM: MRI ABDOMEN WITHOUT AND WITH CONTRAST TECHNIQUE: Multiplanar multisequence MR imaging of the abdomen was performed both before and after the administration of intravenous contrast. CONTRAST:  8.3mL GADAVIST GADOBUTROL 1 MMOL/ML IV SOLN COMPARISON:  CT abdomen pelvis April 27, 2024. FINDINGS: Lower chest: Unremarkable Hepatobiliary: Right hepatic lobe T2 hyperintense subcentimeter cyst. No significant hepatic steatosis. Cholelithiasis. Pancreas: Bifid pancreatic duct with dominant duct of Santorini. No pancreatic mass identified. Spleen: Heterogeneous T2 hyperintense lobulated mass involving the inferior half of the spleen measuring approximately 11.5 x 6.5 cm, stable to prior CT  remeasured in the same manner. Heterogeneous postcontrast enhancement is present. The mass is not involving the splenic flexure of the colon with preserved fat planes. Splenic vessels are patent. Adrenals/Urinary Tract: Enhancing mass at the region of left adrenal gland lateral limb may represent conglomerate lymph nodes versus involvement of the adrenal gland measuring 2.9 x 2.6 cm. Right adrenal is normal. Both kidneys are unremarkable. No hydronephrosis. Stomach/Bowel: No bowel abnormality or obstruction. The mass is abutting the lateral aspect of the gastric fundus however no direct involvement identified. Vascular/Lymphatic: Multiple retroperitoneal necrotic lymph nodes with peripheral enhancement in left para-aortic at the level of adrenal and left renal hilum consistent with metastatic disease. Additional subcentimeter and pericentimeter bilateral retrocrural lymph nodes are identified up to 1.1 cm extending to the aortic hiatus. Musculoskeletal: Degenerative changes of the spine. IMPRESSION: Lobulated large splenic mass with associated  retroperitoneal/retrocrural lymphadenopathy and possible left adrenal involvement. Differential diagnosis include malignancy such as lymphoma, metastasis, angiosarcoma , malignant fibrous or infection/abscess. Cholelithiasis. Electronically Signed   By: Megan  Zare M.D.   On: 05/25/2024 18:06   CT ABDOMEN PELVIS W CONTRAST Result Date: 04/27/2024 CLINICAL DATA:  Lower abdominal pain. EXAM: CT ABDOMEN AND PELVIS WITH CONTRAST TECHNIQUE: Multidetector CT imaging of the abdomen and pelvis was performed using the standard protocol following bolus administration of intravenous contrast. RADIATION DOSE REDUCTION: This exam was performed according to the departmental dose-optimization program which includes automated exposure control, adjustment of the mA and/or kV according to patient size and/or use of iterative reconstruction technique. CONTRAST:  100mL OMNIPAQUE IOHEXOL 300 MG/ML  SOLN COMPARISON:  None Available. FINDINGS: Lower chest: Mild cardiomegaly. Coronary artery calcifications. Subsegmental atelectasis or scarring within both lower lobes and lingula. Hepatobiliary: Mild diffuse hepatic steatosis. No evidence of focal liver lesion. Small gallstones within physiologically distended gallbladder. No pericholecystic inflammation. Pancreas: No ductal dilatation or inflammation. There is no obvious pancreatic mass. Spleen: Lobulated low-density lesions involving the anterior and inferior spleen, appear slightly coalescent with indistinct margins. Some of these are partially exophytic, and approach the lateral aspect of the stomach. Central low density may represent necrosis. For example 5 cm lesion near the hilum series 2, image 21. Some peripheral area of low-density inferiorly may represent infarct or additional lesions. There is adjacent fat stranding and inflammation. The splenic vein appears patent. Adrenals/Urinary Tract: Definite adrenal nodule. Lobulated low-density abuts the left adrenal gland measuring  2.4 cm, but is favored to represent adenopathy rather than a discrete adrenal nodule. No hydronephrosis. No renal calculi. No evidence of focal renal lesion. Partially distended. Bladder courses into the right pelvis with mild diffuse bladder wall thickening. No perivesicular fat stranding. Stomach/Bowel: Splenic flexure of the colon approaches the abnormal spleen but fat plane is present, no definite continuity. The splenic flexure of the colon is decompressed and not well assessed. The descending colon is redundant. Colonic diverticulosis is most prominent in the left colon, no diverticulitis. Small to moderate volume of colonic stool. High-riding cecum in the right mid abdomen. The appendix is not confidently visualized. No small bowel obstruction or wall thickening. The stomach is not well assessed, nondistended, equivocal wall thickening. There is a small to moderate hiatal hernia. Vascular/Lymphatic: Abdominal aortic atherosclerosis. No aortic aneurysm. The portal and splenic vein is patent. Left upper retroperitoneal adenopathy includes a 2 cm short axis node, series 2, image 23, with low-density suggestive of necrosis. There is a probable necrotic node near the hilum series 2, image 21. Additional small retroperitoneal nodes at the level of  the left kidney. Reproductive: Prominent prostatic enlargement, 6 cm transverse. This causes mass effect on the bladder base. Other: Tacks in the lower abdominal wall from prior hernia repair. No evidence of recurrent hernia. Strandy density in the left upper quadrant. No free air. Musculoskeletal: Lucency within the inferior aspect of L2 vertebra may represent a Schmorl's node, however lucent lesion is also considered. There is diffuse degenerative change throughout the spine. No evidence of intramuscular collection. IMPRESSION: 1. Lobulated low-density lesions involving the anterior and inferior spleen, appear slightly coalescent with indistinct margins. Central low  density may represent necrosis. There is adjacent fat stranding and inflammation. Differential considerations include neoplasm, either primary or metastatic, or infection. 2. Equivocal gastric wall thickening, some of the splenic lesions are exophytic and approach the lateral aspect of the stomach. Consider endoscopy. 3. Left upper retroperitoneal adenopathy, likely necrotic. 4. Lucency within the inferior aspect of L2 vertebra may represent a Schmorl's node, however lucent lesion is also considered. 5. Colonic diverticulosis without diverticulitis. 6. Cholelithiasis without gallbladder inflammation. 7. Hepatic steatosis. 8. Small to moderate hiatal hernia. 9. Prominent prostatic enlargement, causing mass effect on the bladder base. Mild diffuse bladder wall thickening may be due to chronic bladder outlet obstruction. Aortic Atherosclerosis (ICD10-I70.0). Electronically Signed   By: Andrea Gasman M.D.   On: 04/27/2024 19:12    ASSESSMENT & PLAN LACOREY BRUSCA is a 83 y.o. male presenting to [facility] for consultation regarding splenic mass. Patient will proceed with laboratory workup today.   #Splenic and adrenal masses - Reviewed ED work up and MRI abdomen with patient and daughter. Discussed ddx includes malignancy, lymphoproliferative disorder, infectious or inflammatory process. - Will check labs today including CBC, CMP, ESR, CRP, LDH and flow cytometry. Will also order TB testing.  - PET scan ordered for further evaluation to help determine next steps of workup, ?biopsy site vs splenectomy.   #Age related screenings - UTD per age.  -Patient will RTC when work up is complete.  Patient expressed understanding of the recommended workup and is agreeable to move forward.   All questions were answered. The patient knows to call the clinic with any problems, questions or concerns.  Shared visit with Dr. Federico.  Orders Placed This Encounter  Procedures   NM PET Image Initial  (PI) Skull Base To Thigh    [facility] pt    Standing Status:   Future    Expected Date:   05/28/2024    Expiration Date:   05/27/2025    If indicated for the ordered procedure, I authorize the administration of a radiopharmaceutical per Radiology protocol:   Yes    Preferred imaging location?:   [facility]    Release to patient:   Manual release only    Reason for preventing immediate release:   Patient request [1]    Additional details for preventing immediate release:   pt request   CBC with Differential (Cancer Center Only)    Standing Status:   Future    Number of Occurrences:   1    Expiration Date:   05/27/2025    Release to patient:   Manual release only [2]    Reason for preventing immediate release:   Patient request [1]    Additional details for preventing immediate release:   pt request   CMP (Cancer Center only)    Standing Status:   Future    Number of Occurrences:   1    Expiration Date:  05/27/2025    Release to patient:   Manual release only [2]    Reason for preventing immediate release:   Patient request [1]    Additional details for preventing immediate release:   pt request   Flow Cytometry, Peripheral Blood (Oncology)    Standing Status:   Future    Number of Occurrences:   1    Expiration Date:   05/27/2025    Release to patient:   Manual release only [2]    Reason for preventing immediate release:   Patient request [1]    Additional details for preventing immediate release:   pt request   Lactate dehydrogenase (LDH)    Standing Status:   Future    Number of Occurrences:   1    Expiration Date:   05/27/2025    Release to patient:   Manual release only [2]    Reason for preventing immediate release:   Patient request [1]    Additional details for preventing immediate release:   pt request   C-reactive protein    Release to patient:   Manual release only [2]    Reason for preventing immediate release:   Patient request [1]    Additional details  for preventing immediate release:   pt request   Sedimentation rate    Release to patient:   Manual release only [2]    Reason for preventing immediate release:   Patient request [1]    Additional details for preventing immediate release:   pt request   QuantiFERON-TB Gold Plus    Release to patient:   Manual release only [2]    Reason for preventing immediate release:   Patient request [1]    Additional details for preventing immediate release:   pt request      I have spent a total of 60 minutes minutes of face-to-face and non-face-to-face time, preparing to see the patient, obtaining and/or reviewing separately obtained history, performing a medically appropriate examination, counseling and educating the patient, ordering medications/tests/procedures, referring and communicating with other health care professionals, documenting clinical information in the electronic health record, independently interpreting results and communicating results to the patient, and care coordination.   Tamlyn Sides Walisiewicz PA-C Department of Hematology/Oncology [facility] at [facility] Phone: [phone]  I have read the above note and personally examined the patient. I agree with the assessment and plan as noted above.  Briefly Benjamin Adkins is an 83 year old male who presents for evaluation of a concerning splenic lesion.  Patient underwent a CT scan on 04/27/2024 which showed a lobulated low-density lesions involving the anterior and inferior spleen, appear slightly coalescent with indistinct margins.  Additionally he was noted to have left upper retroperitoneal adenopathy, likely necrotic.  There was a lucency noted on the L2 vertebra as well.  Patient underwent an MRI of the abdomen on 05/24/2024 which showed the same lobulated large splenic mass associated with retroperitoneal and right crural lymphadenopathy with some possible left adrenal involvement.  Due to concern for this  finding the patient was referred to our service for further evaluation and management.  At this time findings are highly concerning for lymphoma versus another malignancy.  Would recommend ordering a nuclear medicine PET CT scan to evaluate further.  Additionally today we will order labs to include CBC, CMP, ESR, and LDH.  The patient has abdominal discomfort which has been present over the last 2 months.  He voiced understanding of our findings and recommendations for  workup moving forward.  Norleen IVAR Kidney, MD Department of Hematology/Oncology [facility] at [facility] Phone: [phone] Pager: [phone] Email: [email or web address]

## 2024-05-27 ENCOUNTER — Encounter: Payer: Self-pay | Admitting: Medical Oncology

## 2024-05-27 ENCOUNTER — Inpatient Hospital Stay: Attending: Physician Assistant | Admitting: Physician Assistant

## 2024-05-27 ENCOUNTER — Inpatient Hospital Stay: Admitting: Oncology

## 2024-05-27 ENCOUNTER — Inpatient Hospital Stay

## 2024-05-27 VITALS — BP 135/76 | HR 73 | Temp 97.9°F | Resp 20 | Wt 180.8 lb

## 2024-05-27 DIAGNOSIS — R161 Splenomegaly, not elsewhere classified: Secondary | ICD-10-CM

## 2024-05-27 DIAGNOSIS — E278 Other specified disorders of adrenal gland: Secondary | ICD-10-CM | POA: Diagnosis not present

## 2024-05-27 LAB — CMP (CANCER CENTER ONLY)
ALT: 20 U/L (ref 0–44)
AST: 36 U/L (ref 15–41)
Albumin: 4.2 g/dL (ref 3.5–5.0)
Alkaline Phosphatase: 120 U/L (ref 38–126)
Anion gap: 8 (ref 5–15)
BUN: 12 mg/dL (ref 8–23)
CO2: 30 mmol/L (ref 22–32)
Calcium: 10.3 mg/dL (ref 8.9–10.3)
Chloride: 99 mmol/L (ref 98–111)
Creatinine: 0.86 mg/dL (ref 0.61–1.24)
GFR, Estimated: >60 mL/min (ref >60)
Glucose, Bld: 113 mg/dL — ABNORMAL HIGH (ref 70–99)
Potassium: 4.1 mmol/L (ref 3.5–5.1)
Sodium: 137 mmol/L (ref 135–145)
Total Bilirubin: 0.8 mg/dL (ref 0.0–1.2)
Total Protein: 8.2 g/dL — ABNORMAL HIGH (ref 6.5–8.1)

## 2024-05-27 LAB — CBC WITH DIFFERENTIAL (CANCER CENTER ONLY)
Abs Immature Granulocytes: 0.05 K/uL (ref 0.00–0.07)
Basophils Absolute: 0.1 K/uL (ref 0.0–0.1)
Basophils Relative: 1 %
Eosinophils Absolute: 0.2 K/uL (ref 0.0–0.5)
Eosinophils Relative: 1 %
HCT: 40.1 % (ref 39.0–52.0)
Hemoglobin: 13.3 g/dL (ref 13.0–17.0)
Immature Granulocytes: 0 %
Lymphocytes Relative: 13 %
Lymphs Abs: 1.6 K/uL (ref 0.7–4.0)
MCH: 27.2 pg (ref 26.0–34.0)
MCHC: 33.2 g/dL (ref 30.0–36.0)
MCV: 82 fL (ref 80.0–100.0)
Monocytes Absolute: 1.1 K/uL — ABNORMAL HIGH (ref 0.1–1.0)
Monocytes Relative: 9 %
Neutro Abs: 9.1 K/uL — ABNORMAL HIGH (ref 1.7–7.7)
Neutrophils Relative %: 76 %
Platelet Count: 242 K/uL (ref 150–400)
RBC: 4.89 MIL/uL (ref 4.22–5.81)
RDW: 14.3 % (ref 11.5–15.5)
WBC Count: 12.1 K/uL — ABNORMAL HIGH (ref 4.0–10.5)
nRBC: 0 % (ref 0.0–0.2)

## 2024-05-27 LAB — SEDIMENTATION RATE: Sed Rate: 66 mm/h — ABNORMAL HIGH (ref 0–16)

## 2024-05-27 LAB — C-REACTIVE PROTEIN: CRP: 6 mg/dL — ABNORMAL HIGH (ref <1.0)

## 2024-05-27 LAB — LACTATE DEHYDROGENASE: LDH: 570 U/L — ABNORMAL HIGH (ref 98–192)

## 2024-05-27 NOTE — Progress Notes (Signed)
 [facility]  Patient presented to clinic, with his daughter [name], for his scheduled appointment with PA-C Mallie. I introduced myself and provided them with my direct contact information. Patient and daughter were both encouraged to call me with any questions/concerns they may have.  Colene KYM Raider, RN, BSN, [facility] Oncology Nurse Navigator, [facility] 05/27/2024 2:46 PM

## 2024-05-27 NOTE — Patient Instructions (Signed)
 [facility] Office Visit Discharge Information and Instructions  Thank you for choosing [facility] for your healthcare needs.  Below is a summary of today's discussion, along with our contact information and an outline of what to expect next.  Reason for Visit:  splenic mass  Proposed Diagnostic Care Plan: Labs collected today PET scan ordered today. Once approved by authorizations department you will be contacted to schedule the scan.     What to Expect: - Generally, when lab tests are ordered the results can take up to 1 week for results to be available.  At that point, we will contact you to discuss your results with you.  Unless there is a critical result, we will typically wait for all of your lab results to be available before contacting you. - If a biopsy is part of your Care Plan, those results can take on average 7-10 days to result.  Once results are available, we will contact you to discuss your pathology results and any next steps. - If you have additional imaging ordered, such as a CT Scan, MRI, Ultrasound, Bone Scan, or PET scan, your imaging will need to be authorized then scheduled with the earliest available appointment.  You may be asked to travel to another hospital within [facility] who has a sooner availability, please consider doing so if asked. - If you use MyChart, your results will be available to you in the MyChart portal.  Your provider will be in touch with you as soon as all of your results are available to be discussed.  Your Diagnostic Clinic Provider:  Delquan Poucher Walisiewicz PA-C and Dr. Federico Your Diagnostic Navigator:  Colene Raider RN, office number [phone]  If you or your caregiver have number blocking on your cell phones, please ensure the cancer center's numbers are not blocked.  If you are not a registered MyChart user, please consider enrolling in MyChart to receive your test results and visit notes.  You can also access your  discharge instructions electronically.  MyChart also gives you an electronic means to communicate with your Care Team instead of needing to call in to the cancer center.  We appreciate you trusting us  with your healthcare and look forward to partnering with you as we work to uncover what your potential diagnosis may be.  Please do not hesitate to reach out at any point with questions or concerns.

## 2024-05-30 ENCOUNTER — Encounter: Payer: Self-pay | Admitting: Physician Assistant

## 2024-05-30 DIAGNOSIS — R161 Splenomegaly, not elsewhere classified: Secondary | ICD-10-CM | POA: Diagnosis not present

## 2024-05-31 LAB — SURGICAL PATHOLOGY

## 2024-05-31 LAB — QUANTIFERON-TB GOLD PLUS (RQFGPL)
QuantiFERON Mitogen Value: 0.75 [IU]/mL
QuantiFERON Nil Value: 0.04 [IU]/mL
QuantiFERON TB1 Ag Value: 0.04 [IU]/mL
QuantiFERON TB2 Ag Value: 0.04 [IU]/mL

## 2024-05-31 LAB — QUANTIFERON-TB GOLD PLUS: QuantiFERON-TB Gold Plus: NEGATIVE

## 2024-05-31 LAB — FLOW CYTOMETRY

## 2024-06-02 ENCOUNTER — Encounter (HOSPITAL_COMMUNITY)
Admission: RE | Admit: 2024-06-02 | Discharge: 2024-06-02 | Disposition: A | Discharge status: Home / Self Care | Source: Ambulatory Visit | Attending: Physician Assistant | Admitting: Physician Assistant

## 2024-06-02 ENCOUNTER — Encounter: Payer: Self-pay | Admitting: Medical Oncology

## 2024-06-02 DIAGNOSIS — J90 Pleural effusion, not elsewhere classified: Secondary | ICD-10-CM | POA: Diagnosis not present

## 2024-06-02 DIAGNOSIS — R161 Splenomegaly, not elsewhere classified: Secondary | ICD-10-CM | POA: Diagnosis not present

## 2024-06-02 DIAGNOSIS — C778 Secondary and unspecified malignant neoplasm of lymph nodes of multiple regions: Secondary | ICD-10-CM | POA: Insufficient documentation

## 2024-06-02 DIAGNOSIS — E279 Disorder of adrenal gland, unspecified: Secondary | ICD-10-CM | POA: Insufficient documentation

## 2024-06-02 DIAGNOSIS — I700 Atherosclerosis of aorta: Secondary | ICD-10-CM | POA: Diagnosis not present

## 2024-06-02 DIAGNOSIS — C801 Malignant (primary) neoplasm, unspecified: Secondary | ICD-10-CM | POA: Diagnosis not present

## 2024-06-02 DIAGNOSIS — K8020 Calculus of gallbladder without cholecystitis without obstruction: Secondary | ICD-10-CM | POA: Diagnosis not present

## 2024-06-02 LAB — GLUCOSE, CAPILLARY: Glucose-Capillary: 82 mg/dL (ref 70–99)

## 2024-06-02 MED ORDER — FLUDEOXYGLUCOSE F - 18 (FDG) INJECTION
9.0000 | Freq: Once | INTRAVENOUS | Status: AC | PRN
Start: 2024-06-02 — End: 2024-06-02
  Administered 2024-06-02: 9 via INTRAVENOUS

## 2024-06-02 NOTE — Progress Notes (Signed)
 [facility]  Patient called with several questions regarding symptoms he is having or has noticed and asking for assistance/suggestions.   1) Patient states that he wakes up in the middle of the night, for approximately 2 weeks now, having to sit up in his recliner to get comfortable. States he has increased gas in his abdomen that is causing him pain, 4/10, which is waking him up. Patient asking if there is a solution to this.  2) Skin rashes: states popped up on his chest and armpit area, in addition to legs. Describes them as red spots and slightly itchy. Asking if there is any prescription that would help with the rash.  3) States continues to lose weight. Patient states his current weigh is at 174 lbs, asking if there is a nutritional supplement/protein to help. I recommended patient look into a high quality protein powder or shake to supplement his diet.   Patient informed that I will get his message to [facility] and will call him back with her recommendations.  Patient gave her verbal understanding and was encouraged to call me with further questions/concerns.   Message sent to [facility] for review.  Colene KYM Raider, RN, BSN, [facility] Oncology Nurse Navigator, [facility] 06/02/2024 10:33 AM

## 2024-06-03 ENCOUNTER — Telehealth: Payer: Self-pay | Admitting: Physician Assistant

## 2024-06-03 ENCOUNTER — Encounter: Payer: Self-pay | Admitting: Medical Oncology

## 2024-06-03 DIAGNOSIS — R948 Abnormal results of function studies of other organs and systems: Secondary | ICD-10-CM

## 2024-06-03 NOTE — Telephone Encounter (Signed)
 I notified Benjamin Adkins and daughter by phone regarding PET scan results after reviewing with Dr. Federico. Scan shows widespread metastatic disease to nodal stations of the abdomen, chest, and low neck. Urgent referral sent to ENT for consideration of supraclavicular node biopsy to aid in diagnosis. All of patient's questions were answered and he expressed understanding of the plan provided.

## 2024-06-06 ENCOUNTER — Other Ambulatory Visit: Payer: Self-pay | Admitting: Physician Assistant

## 2024-06-06 ENCOUNTER — Encounter: Payer: Self-pay | Admitting: Physician Assistant

## 2024-06-06 ENCOUNTER — Encounter (HOSPITAL_COMMUNITY): Payer: Self-pay

## 2024-06-06 DIAGNOSIS — R948 Abnormal results of function studies of other organs and systems: Secondary | ICD-10-CM

## 2024-06-06 NOTE — Progress Notes (Signed)
 Vanice Sharper, MD  Shahzaib Azevedo Ok for US  CORE BX LEFT SUPRACLAV PET+ NODAL MASS  SEE PET  Thanks TS       Previous Messages    ----- Message ----- From: Makeshia Seat Sent: 06/06/2024   1:05 PM EST To: Danisa Kopec; Ir Procedure Requests Subject: US  CORE BIOPSY (LYMPH NODES)                  Procedure : US  CORE BIOPSY (LYMPH NODES)  Reason : enlarged left supraclavicular lymph node- RDS pt needing bx for diagnostic purproses Dx: Abnormal positron emission tomography (PET) scan [M05.1 (ICD-10-CM)]    History : NM PET initial skull base to thigh , mr abd w/ wo , CT abd pelv w.  Provider : Walisiewicz, Kaitlyn E, PA-C  Contact: [phone]

## 2024-06-06 NOTE — Progress Notes (Signed)
 Vanice Sharper, MD  Dontel Harshberger Sedation       Previous Messages    ----- Message ----- From: Caspar Favila Sent: 06/06/2024   2:17 PM EST To: Sharper Vanice, MD Subject: RE: US  CORE BIOPSY (LYMPH NODES)              Is this done locally or with mod sedation ? ----- Message ----- From: Vanice Sharper, MD Sent: 06/06/2024   2:13 PM EST To: [place]ville Espina Subject: RE: US  CORE BIOPSY (LYMPH NODES)              Ok for US  CORE BX LEFT SUPRACLAV PET+ NODAL MASS  SEE PET  Thanks TS ----- Message ----- From: Deshone Lyssy Sent: 06/06/2024   1:05 PM EST To: Lailie Smead; Ir Procedure Requests Subject: US  CORE BIOPSY (LYMPH NODES)                  Procedure : US  CORE BIOPSY (LYMPH NODES)  Reason : enlarged left supraclavicular lymph node- RDS pt needing bx for diagnostic purproses Dx: Abnormal positron emission tomography (PET) scan [M05.1 (ICD-10-CM)]    History : NM PET initial skull base to thigh , mr abd w/ wo , CT abd pelv w.  Provider : Walisiewicz, Kaitlyn E, PA-C  Contact: [phone]

## 2024-06-07 ENCOUNTER — Encounter: Payer: Self-pay | Admitting: Medical Oncology

## 2024-06-07 NOTE — Progress Notes (Signed)
 [facility]  Call to patient to confirm he has no questions regarding scheduled biopsy. Patient asking which lymph node will be biopsied. Informed patient I will need to check with PA-C about that. Also patient asking if there is anything that can help with the gassiness he is feeling. Patient states he gets about 3-4 hours of sleep at a time before he is woken up by the gassiness and stomach ache. He states that his PCP increased his omeprazole  rx and suggested gas-ex in the evening before bed. Patient informed Mallie is out of the office today and when she returns tomorrow I will review his questions with her and will call him back. Patient gave verbal understanding. Denies further questions at this time.    Colene KYM Raider, RN, BSN, [facility] Oncology Nurse Navigator, [facility] 06/07/2024 3:13 PM

## 2024-06-08 ENCOUNTER — Encounter: Payer: Self-pay | Admitting: Medical Oncology

## 2024-06-08 NOTE — Progress Notes (Signed)
 [facility]  Follow up call to daughter, who was requesting to see if there are any sooner appointments for biopsy. Informed daughter that I spoke with scheduling and at this time there is nothing available sooner than his current appointment. Daughter expressed appreciation for us  checking. Also confirmed with her site of biopsy is to be the supraclavicular node. Daughter gave verbal understanding, no further questions at this time.   Colene KYM Raider, RN, BSN, [facility] Oncology Nurse Navigator, [facility] 06/08/2024 4:07 PM

## 2024-06-09 ENCOUNTER — Other Ambulatory Visit: Payer: Self-pay | Admitting: Cardiovascular Disease

## 2024-06-13 ENCOUNTER — Telehealth: Payer: Self-pay | Admitting: Cardiovascular Disease

## 2024-06-13 MED ORDER — METOPROLOL SUCCINATE ER 25 MG PO TB24: 25.0000 mg | ORAL_TABLET | Freq: Every day | ORAL | 0 refills | Status: DC

## 2024-06-13 NOTE — Telephone Encounter (Signed)
 Refills has been sent to the pharmacy.

## 2024-06-13 NOTE — Telephone Encounter (Signed)
*  STAT* If patient is at the pharmacy, call can be transferred to refill team.   1. Which medications need to be refilled? (please list name of each medication and dose if known)   metoprolol  succinate (TOPROL -XL) 25 MG 24 hr tablet    4. Which pharmacy/location (including street and city if local pharmacy) is medication to be sent to? [id number] - [place], [place] - [address]     5. Do they need a 30 day or 90 day supply? 90     Sch 07/28/24

## 2024-06-22 NOTE — H&P (Signed)
 Chief Complaint: Splenic lesion, lymphadenopathy; referred for image guided left supraclavicular lymph node biopsy for further evaluation  Referring Provider(s): Dorsey,J  Supervising Physician: Philip Cornet  Patient Status: [facility]  History of Present Illness: Benjamin Adkins is an 83 y.o. male ex smoker with past medical history significant for arthritis, coronary artery disease with prior MI/CABG, DJD, gastroesophageal reflux, hypertension, hyperlipidemia, sleep apnea, right bundle blanch block, elevated PSA who presents now with imaging findings of splenic lesion and lymphadenopathy.  He is scheduled today for image guided left supraclavicular lymph node biopsy for further evaluation.  Denies fever, chills, abdominal pain, SHOB, CP, dizziness, blood in urine or stool, leg swelling Reports fatigue, decreased appetite, loss of about 30 pounds over the past 2-3 months NPO since MN Wears a CPAP at nighttime  Patient reports he is DNR Code, but rescinds DNR for the duration of the procedure. Patient is FULL Code for this procedure.  Past Medical History:  Diagnosis Date   Arthritis    Coronary atherosclerosis of unspecified type of vessel, native or graft    MI 2006, h/o CABG   DJD (degenerative joint disease)    Elevated prostate specific antigen (PSA)    Elevated PSA    Esophageal reflux    GERD (gastroesophageal reflux disease)    HTN (hypertension)    Hyperlipidemia    nmr lipoprofile 2004: LDL 154 (1653/711), HDL 42, TG 80.NMR  LDL =,125, but PMH  of MI LDL goal=,70   Hypertension    Myocardial infarction [facility])    2006   Other cataract    Personal history of colonic polyps    Shortness of breath    DOE   Sleep apnea    uses CPAP    Past Surgical History:  Procedure Laterality Date   CARDIAC CATHETERIZATION     2006   CATARACT EXTRACTION Bilateral    COLONOSCOPY W/ POLYPECTOMY     CORONARY ARTERY BYPASS GRAFT  2006   X 4    EYE SURGERY     HERNIA REPAIR      VENTRAL, LEFT & RIGHT GROIN   TOTAL KNEE ARTHROPLASTY  2006   TOTAL KNEE ARTHROPLASTY  08/25/2011   Procedure: TOTAL KNEE ARTHROPLASTY;  Surgeon: Lamar DELENA Millman, MD;  Location: [facility] OR;  Service: Orthopedics;  Laterality: Right;  DR MILLMAN WANTS 90 MINUTES FOR SURGERY    Allergies: Short ragweed pollen ext  Medications: Prior to Admission medications   Medication Sig Start Date End Date Taking? Authorizing Provider  amoxicillin  (AMOXIL ) 500 MG capsule TAKE 4 CAPSULES BY MOUTH AS NEEDED FOR DENTAL PROCEDURE 04/01/22   Paz, Jose E, MD  aspirin  325 MG EC tablet Take 325 mg by mouth daily.    [provider]  atorvastatin  (LIPITOR) 40 MG tablet Take 1 tablet (40 mg total) by mouth daily. 05/19/23   Nishan, Peter C, MD  azelastine  (ASTELIN ) 0.1 % nasal spray Place 2 sprays into both nostrils 2 (two) times daily. Use in each nostril as directed 03/16/24   Almarie Waddell NOVAK, NP  benzonatate  (TESSALON ) 200 MG capsule Take 1 capsule (200 mg total) by mouth 3 (three) times daily as needed. 03/22/24 03/22/25  Parrett, Madelin RAMAN, NP  celecoxib  (CELEBREX ) 100 MG capsule Take 1 capsule (100 mg total) by mouth 2 (two) times daily as needed for moderate pain. 08/30/19   Paz, Jose E, MD  cetirizine (ZYRTEC) 10 MG tablet Take 10 mg by mouth daily.    [provider]  ezetimibe  (ZETIA ) 10 MG tablet TAKE 1 TABLET BY MOUTH EVERY DAY 01/06/24   Paz, Jose E, MD  fluticasone  (FLONASE ) 50 MCG/ACT nasal spray Place 2 sprays into both nostrils daily. 03/16/24   Almarie Waddell NOVAK, NP  HYDROcodone -acetaminophen  (NORCO/VICODIN) 5-325 MG tablet Take 0.5-1 tablets by mouth every 4 (four) hours as needed. 04/27/24   Odell Balls, PA-C  hydrocortisone  (ANUSOL -HC) 25 MG suppository Place 1 suppository (25 mg total) rectally 2 (two) times daily as needed for hemorrhoids. 09/22/23   Paz, Jose E, MD  metoprolol  succinate (TOPROL -XL) 25 MG 24 hr tablet Take 1 tablet (25 mg total) by mouth daily. KEEP JANUARY OFFICE VISIT. 06/13/24    Nishan, Peter C, MD  Multiple Vitamin (MULTIVITAMIN) capsule Take 1 capsule by mouth daily.    [provider]  nitroGLYCERIN  (NITROSTAT ) 0.4 MG SL tablet Place 1 tablet (0.4 mg total) under the tongue every 5 (five) minutes as needed. 11/26/18   Delford Maude BROCKS, MD  omeprazole  (PRILOSEC) 40 MG capsule Take 1 capsule (40 mg total) by mouth daily. 05/13/24   Douglass Kenney NOVAK, FNP     Family History  Problem Relation Age of Onset   Hypertension Mother    Breast cancer Mother    Coronary artery disease Mother    Heart disease Mother    Arthritis Mother    Cancer Mother    Stomach cancer Father    Breast cancer Sister    Crohn's disease Sister    Arthritis Sister    Asthma Sister        childhood   Arthritis Brother    Heart disease Brother    Heart attack Paternal Grandfather 60   Colon cancer Neg Hx    Prostate cancer Neg Hx     Social History   Socioeconomic History   Marital status: Widowed    Spouse name: Not on file   Number of children: 1   Years of education: Not on file   Highest education level: Master's degree (e.g., MA, MS, MEng, MEd, MSW, MBA)  Occupational History   Occupation: retired  Tobacco Use   Smoking status: Former    Current packs/day: 0.00    Average packs/day: 0.3 packs/day for 3.0 years (0.8 ttl pk-yrs)    Types: Cigarettes    Start date: 04/12/1959    Quit date: 08/12/1961    Years since quitting: 62.9   Smokeless tobacco: Never   Tobacco comments:    during college  Vaping Use   Vaping status: Never Used  Substance and Sexual Activity   Alcohol use: Yes    Alcohol/week: 1.0 standard drink of alcohol    Types: 1 Glasses of wine per week    Comment: with dinner occ   Drug use: No   Sexual activity: Yes  Other Topics Concern   Not on file  Social History Narrative   Lives alone.    Has a daughter in [place] , [place] G-son   Daughter: [name] [phone]   Social Drivers of Health   Financial Resource Strain: Low Risk   (05/13/2024)   Overall Financial Resource Strain (CARDIA)    Difficulty of Paying Living Expenses: Not hard at all  Food Insecurity: No Food Insecurity (05/26/2024)   Hunger Vital Sign    Worried About Running Out of Food in the Last Year: Never true    Ran Out of Food in the Last Year: Never true  Transportation Needs: No Transportation Needs (05/26/2024)  PRAPARE - Administrator, Civil Service (Medical): No    Lack of Transportation (Non-Medical): No  Physical Activity: Sufficiently Active (05/13/2024)   Exercise Vital Sign    Days of Exercise per Week: 4 days    Minutes of Exercise per Session: 40 min  Stress: No Stress Concern Present (05/13/2024)   [employer] of Occupational Health - Occupational Stress Questionnaire    Feeling of Stress: Only a little  Social Connections: Moderately Integrated (05/13/2024)   Social Connection and Isolation Panel    Frequency of Communication with Friends and Family: Three times a week    Frequency of Social Gatherings with Friends and Family: Three times a week    Attends Religious Services: More than 4 times per year    Active Member of Clubs or Organizations: Yes    Attends Banker Meetings: More than 4 times per year    Marital Status: Widowed    Vital Signs: 98.1 F oral temperature 130/85 HR 87 RR 16 O2 97% room air  Advance Care Plan: no documents on file   Physical Exam HENT:     Mouth/Throat:     Mouth: Mucous membranes are moist.  Cardiovascular:     Rate and Rhythm: Normal rate and regular rhythm.  Pulmonary:     Effort: Pulmonary effort is normal. No respiratory distress.     Breath sounds: Normal breath sounds.  Abdominal:     General: Bowel sounds are normal.     Palpations: Abdomen is soft.     Tenderness: There is no abdominal tenderness.  Skin:    General: Skin is warm and dry.  Neurological:     Mental Status: He is alert and oriented to person, place, and time.  Psychiatric:         Mood and Affect: Mood normal.        Behavior: Behavior normal.     Imaging: NM PET Image Initial (PI) Skull Base To Thigh Result Date: 06/02/2024 EXAM: PET AND CT SKULL BASE TO MID THIGH 06/02/2024 05:37:07 PM TECHNIQUE: RADIOPHARMACEUTICAL: 9 mCi F-18 FDG Uptake time 60 minutes. Glucose level 82 mg/dl. PET imaging was acquired from the base of the skull to the mid thighs. Non-contrast enhanced computed tomography was obtained for attenuation correction and anatomic localization. COMPARISON: MRI of 05/24/2024 and CT of 04/27/2024. CLINICAL HISTORY: rapid diagnostic clinic pt. splenic and left adrenal mass FINDINGS: HEAD AND NECK: Low left jugular / supraclavicular nodal mass measures 2.8 cm and SUV 39.8 on image 44 / 4. Bilateral carotid atherosclerosis. CHEST: Small left pleural effusion. A focus of left pleural hypermetabolism and nodularity versus a pleural basal lymph node measures 11 mm and SUV 15.9 on image 90 / 4. Median sternotomy for CABG. Tiny hiatal hernia. Left base scarring or subsegmental atelectasis. Bilateral retrocrural nodal metastases at 11 mm and SUV 32.4 on image 101 / 4. ABDOMEN AND PELVIS: The mass centered about the anteromedial spleen and gastrosplenic ligament is hypermetabolic on the order of 7.5 x 5.4 cm and SUV 37.2 on image 99 / 4. Confluent left adrenal and left periaortic nodal metastases including at 3.3 x 3.1 cm and SUV 36.7 on image 118 / 4. Eccentric right apical prostatic hypermetabolism is small volume at SUV 5.6. Dependent gallstones. Normal right adrenal gland. Tiny hiatal hernia. Abdominal aortic atherosclerosis. Ventral pelvic wall hernia repair. Diverticulosis. Physiologic activity within the gastrointestinal and genitourinary systems. BONES AND SOFT TISSUE: There is no hypermetabolic activity to suggest osseous metastatic  disease. IMPRESSION: 1. Widespread metastatic disease to nodal stations of the abdomen, chest, and low neck. The left low jugular  supraclavicular nodal mass would likely be amenable to tissue sampling. 2. Dominant mass centered about the splenic hilum and gastrosplenic ligament could represent a primary or site of metastatic disease. Presuming this is a primary, considerations would include angiosarcoma or GI stromal tumor off the greater curvature of the stomach. If this also represents metastasis, consideration an aggressive histology such as melanoma 3. Small volume right apical prostatic hypermetabolism is nonspecific and not felt to represent the site of primary. Of doubtful clinical significance given comorbidities. 4. Incidental findings, including: Small left pleural effusion. Cholelithiasis. aortic atherosclerosis (icd10-i70.0). Electronically signed by: Rockey Kilts MD 06/02/2024 07:04 PM EST RP Workstation: [id number]    Labs:  CBC: Recent Labs    08/25/23 1031 04/27/24 1615 05/27/24 1408  WBC 7.3 12.0* 12.1*  HGB 14.3 12.5* 13.3  HCT 41.4 36.9* 40.1  PLT 173.0 181 242    COAGS: No results for input(s): INR, APTT in the last 8760 hours.  BMP: Recent Labs    08/25/23 1031 04/27/24 1615 05/27/24 1408  NA 137 131* 137  K 4.1 3.8 4.1  CL 103 96* 99  CO2 27 22 30   GLUCOSE 100* 135* 113*  BUN 14 10 12   CALCIUM  8.9 9.6 10.3  CREATININE 0.93 0.79 0.86  GFRNONAA  --  >60 >60    LIVER FUNCTION TESTS: Recent Labs    08/25/23 1031 04/27/24 1615 05/27/24 1408  BILITOT 0.9 0.7 0.8  AST 29 32 36  ALT 20 16 20   ALKPHOS 98 121 120  PROT 6.6 7.1 8.2*  ALBUMIN 4.1 4.0 4.2    TUMOR MARKERS: No results for input(s): AFPTM, CEA, CA199, CHROMGRNA in the last 8760 hours.  Assessment and Plan: 83 y.o. male ex smoker with past medical history significant for arthritis, coronary artery disease with prior MI/CABG, DJD, gastroesophageal reflux, hypertension, hyperlipidemia, sleep apnea, right bundle blanch block, elevated PSA who presents now with imaging findings of splenic lesion and  lymphadenopathy.  He is scheduled today for image guided left supraclavicular lymph node biopsy for further evaluation.   Risks and benefits of image guided left supraclavicular lymph node biopsy was discussed with the patient including, but not limited to bleeding, infection, damage to adjacent structures or low yield requiring additional tests.  All of the questions were answered and there is agreement to proceed.  Consent signed and in chart.    Thank you for allowing our service to participate in BASHAR MILAM 's care.  Electronically Signed: Kristi B Davenport, NP   06/23/2024, 11:03 AM      I spent a total of   20 minutes   in face to face in clinical consultation, greater than 50% of which was counseling/coordinating care for image guided left supraclavicular lymph node biopsy.

## 2024-06-23 ENCOUNTER — Inpatient Hospital Stay (HOSPITAL_COMMUNITY)
Admission: RE | Admit: 2024-06-23 | Discharge: 2024-06-23 | Disposition: A | Source: Ambulatory Visit | Attending: Physician Assistant

## 2024-06-23 ENCOUNTER — Ambulatory Visit (HOSPITAL_COMMUNITY)
Admission: RE | Admit: 2024-06-23 | Discharge: 2024-06-23 | Disposition: A | Source: Ambulatory Visit | Attending: Physician Assistant

## 2024-06-23 ENCOUNTER — Other Ambulatory Visit: Payer: Self-pay

## 2024-06-23 ENCOUNTER — Encounter (HOSPITAL_COMMUNITY): Payer: Self-pay

## 2024-06-23 VITALS — BP 113/57 | HR 75 | Temp 98.4°F | Resp 16

## 2024-06-23 DIAGNOSIS — R948 Abnormal results of function studies of other organs and systems: Secondary | ICD-10-CM

## 2024-06-23 DIAGNOSIS — R591 Generalized enlarged lymph nodes: Secondary | ICD-10-CM

## 2024-06-23 HISTORY — PX: IR US LIVER BIOPSY: IMG936

## 2024-06-23 LAB — CBC WITH DIFFERENTIAL/PLATELET
Abs Immature Granulocytes: 0.06 K/uL (ref 0.00–0.07)
Basophils Absolute: 0 K/uL (ref 0.0–0.1)
Basophils Relative: 0 %
Eosinophils Absolute: 0.1 K/uL (ref 0.0–0.5)
Eosinophils Relative: 1 %
HCT: 36.9 % — ABNORMAL LOW (ref 39.0–52.0)
Hemoglobin: 12 g/dL — ABNORMAL LOW (ref 13.0–17.0)
Immature Granulocytes: 1 %
Lymphocytes Relative: 9 %
Lymphs Abs: 1.2 K/uL (ref 0.7–4.0)
MCH: 27.1 pg (ref 26.0–34.0)
MCHC: 32.5 g/dL (ref 30.0–36.0)
MCV: 83.3 fL (ref 80.0–100.0)
Monocytes Absolute: 1.2 K/uL — ABNORMAL HIGH (ref 0.1–1.0)
Monocytes Relative: 9 %
Neutro Abs: 10.2 K/uL — ABNORMAL HIGH (ref 1.7–7.7)
Neutrophils Relative %: 80 %
Platelets: 227 K/uL (ref 150–400)
RBC: 4.43 MIL/uL (ref 4.22–5.81)
RDW: 14.6 % (ref 11.5–15.5)
WBC: 12.7 K/uL — ABNORMAL HIGH (ref 4.0–10.5)
nRBC: 0 % (ref 0.0–0.2)

## 2024-06-23 LAB — PROTIME-INR
INR: 1.1 (ref 0.8–1.2)
Prothrombin Time: 15.3 s — ABNORMAL HIGH (ref 11.4–15.2)

## 2024-06-23 MED ORDER — FENTANYL CITRATE (PF) 100 MCG/2ML IJ SOLN
INTRAMUSCULAR | Status: AC | PRN
  Administered 2024-06-23 (×2): 25 ug via INTRAVENOUS

## 2024-06-23 MED ORDER — SODIUM CHLORIDE 0.9 % IV SOLN: INTRAVENOUS | Status: DC

## 2024-06-23 MED ORDER — FENTANYL CITRATE (PF) 100 MCG/2ML IJ SOLN
INTRAMUSCULAR | Status: AC
  Filled 2024-06-23: qty 2

## 2024-06-23 MED ORDER — LIDOCAINE HCL 1 % IJ SOLN: 20.0000 mL | Freq: Once | INTRAMUSCULAR | Status: DC

## 2024-06-23 MED ORDER — MIDAZOLAM HCL 2 MG/2ML IJ SOLN
INTRAMUSCULAR | Status: AC
  Filled 2024-06-23: qty 2

## 2024-06-23 MED ORDER — MIDAZOLAM HCL (PF) 2 MG/2ML IJ SOLN
INTRAMUSCULAR | Status: AC | PRN
  Administered 2024-06-23: .5 mg via INTRAVENOUS
  Administered 2024-06-23: 1 mg via INTRAVENOUS
  Administered 2024-06-23: .5 mg via INTRAVENOUS

## 2024-06-23 MED ORDER — LIDOCAINE HCL 1 % IJ SOLN
INTRAMUSCULAR | Status: AC
  Filled 2024-06-23: qty 20

## 2024-06-23 NOTE — Discharge Instructions (Signed)
Please call Interventional Radiology clinic [phone] with any questions or concerns.   You may remove your dressing and shower tomorrow.    Needle Biopsy, Care After These instructions tell you how to care for yourself after your procedure. Your doctor may also give you more specific instructions. Call your doctor if youhave any problems or questions. What can I expect after the procedure? After the procedure, it is common to have: Soreness. Bruising. Mild pain. Follow these instructions at home:  Return to your normal activities as told by your doctor. Ask your doctor what activities are safe for you. Take over-the-counter and prescription medicines only as told by your doctor. Wash your hands with soap and water before you change your bandage (dressing). If you cannot use soap and water, use hand sanitizer. Follow instructions from your doctor about: How to take care of your puncture site. When and how to change your bandage. When to remove your bandage. Check your puncture site every day for signs of infection. Watch for: Redness, swelling, or pain. Fluid or blood. Pus or a bad smell. Warmth. Do not take baths, swim, or use a hot tub until your doctor approves. Ask your doctor if you may take showers. You may only be allowed to take sponge baths. Keep all follow-up visits as told by your doctor. This is important. Contact a doctor if you have: A fever. Redness, swelling, or pain at the puncture site, and it lasts longer than a few days. Fluid, blood, or pus coming from the puncture site. Warmth coming from the puncture site. Get help right away if: You have a lot of bleeding from the puncture site. Summary After the procedure, it is common to have soreness, bruising, or mild pain at the puncture site. Check your puncture site every day for signs of infection, such as redness, swelling, or pain. Get help right away if you have severe bleeding from your puncture  site. This information is not intended to replace advice given to you by your health care provider. Make sure you discuss any questions you have with your healthcare provider. Document Revised: 01/05/2020 Document Reviewed: 01/05/2020 Elsevier Patient Education  2022 [employer]   Moderate Conscious Sedation, Adult, Care After This sheet gives you information about how to care for yourself after your procedure. Your health care provider may also give you more specific instructions. If you have problems or questions, contact your health care provider. What can I expect after the procedure? After the procedure, it is common to have: Sleepiness for several hours. Impaired judgment for several hours. Difficulty with balance. Vomiting if you eat too soon. Follow these instructions at home: For the time period you were told by your health care provider: Rest. Do not participate in activities where you could fall or become injured. Do not drive or use machinery. Do not drink alcohol. Do not take sleeping pills or medicines that cause drowsiness. Do not make important decisions or sign legal documents. Do not take care of children on your own.      Eating and drinking Follow the diet recommended by your health care provider. Drink enough fluid to keep your urine pale yellow. If you vomit: Drink water, juice, or soup when you can drink without vomiting. Make sure you have little or no nausea before eating solid foods.   General instructions Take over-the-counter and prescription medicines only as told by your health care provider. Have a responsible adult stay with you for the time you are told.   It is important to have someone help care for you until you are awake and alert. Do not smoke. Keep all follow-up visits as told by your health care provider. This is important. Contact a health care provider if: You are still sleepy or having trouble with balance after 24 hours. You feel  light-headed. You keep feeling nauseous or you keep vomiting. You develop a rash. You have a fever. You have redness or swelling around the IV site. Get help right away if: You have trouble breathing. You have new-onset confusion at home. Summary After the procedure, it is common to feel sleepy, have impaired judgment, or feel nauseous if you eat too soon. Rest after you get home. Know the things you should not do after the procedure. Follow the diet recommended by your health care provider and drink enough fluid to keep your urine pale yellow. Get help right away if you have trouble breathing or new-onset confusion at home. This information is not intended to replace advice given to you by your health care provider. Make sure you discuss any questions you have with your health care provider. Document Revised: 11/04/2019 Document Reviewed: 06/02/2019 Elsevier Patient Education  2021 [employer]     

## 2024-06-23 NOTE — Procedures (Signed)
 Interventional Radiology Procedure:   Indications: Abdominal mass with scattered lymphadenopathy   Procedure: US  guided biopsy of left supraclavicular mass / lymphadenopathy  Findings: Large hypoechoic mass / lymphadenopathy in left supraclavicular region.  Multiple core biopsies obtained and placed in saline.    Complications: None     EBL: Minimal  Plan:  Bedrest 1 hour   Chaitanya Amedee R. Philip, MD  Pager: [phone]

## 2024-06-27 ENCOUNTER — Encounter: Payer: Self-pay | Admitting: Medical Oncology

## 2024-06-27 ENCOUNTER — Telehealth: Payer: Self-pay | Admitting: Physician Assistant

## 2024-06-27 DIAGNOSIS — C8330 Diffuse large B-cell lymphoma, unspecified site: Secondary | ICD-10-CM

## 2024-06-27 LAB — SURGICAL PATHOLOGY

## 2024-06-27 NOTE — Progress Notes (Signed)
  [facility]  Cancer Care  Diagnostic Nurse Navigator Treatment Team Hand-Off Note  06/27/24  Patient Name:  Benjamin Adkins Patient MRN:  [id number] Patient DOB:  Mar 24, 1941   Patient Care Team: Amon Aloysius BRAVO, MD as PCP - General (Internal Medicine) Delford Maude BROCKS, MD as PCP - Cardiology (Cardiology) Timmy Maude SAUNDERS, MD as Consulting Physician (Oncology) Chauncey Redell Agent, MD as Consulting Physician (Urology) Golden Forestine BROCKS, RN as Oncology Nurse Navigator (Medical Oncology)  Chief Complaint Diffuse large B-cell lymphoma   Oncology History   No history exists.    Cancer Staging  No matching staging information was found for the patient.   SDOH Screening and Interventions Updated:  No  SDOH Screenings   Food Insecurity: No Food Insecurity (05/26/2024)  Housing: Unknown (05/26/2024)  Transportation Needs: No Transportation Needs (05/26/2024)  Utilities: Not At Risk (05/26/2024)  Alcohol Screen: Low Risk  (05/13/2024)  Depression (PHQ2-9): Low Risk  (03/16/2024)  Financial Resource Strain: Low Risk  (05/13/2024)  Physical Activity: Sufficiently Active (05/13/2024)  Social Connections: Moderately Integrated (05/13/2024)  Stress: No Stress Concern Present (05/13/2024)  Tobacco Use: Medium Risk (06/23/2024)  Health Literacy: Adequate Health Literacy (03/07/2024)     Genetics Assessment Completed:  No Genetics Referral Made:  not applicable  Care Team Updated:  Yes    Patient has been informed of his upcoming appointments for an Echocardiogram, Port placement and appointment with Dr. Federico. Patient informed that Dorthea Alu, Hematology Navigator, with meet with him on Wednesday during his appointment with Dr. Federico. Patient expressed thanks and denied further questions at this time.  Echocardiogram: 06/28/2024 Dr. Federico:  06/29/2024 Port-a-cath placement with IR: 07/07/2024. Instructions provided.   Colene KYM Golden, RN,  BSN Oncology Nurse Navigator, [facility] 06/27/2024 4:13 PM

## 2024-06-27 NOTE — Progress Notes (Signed)
 [facility]  Return call to patient's vm left on Sunday. Patient was under the impression that I had called him on Saturday 12/6 regarding his biopsy results. Informed patient that our clinic is closed on the weekends and I had not called him. Patient informed that his biopsy has not resulted and as soon as we have that information PA-C Mallie will call him to update him. Patient gave verbal understanding.   Colene KYM Raider, RN, BSN Oncology Nurse Navigator, [facility] 06/27/2024 10:11 AM

## 2024-06-27 NOTE — Telephone Encounter (Signed)
 I notified Benjamin Adkins by phone regarding lymph node biopsy results. Findings are consistent with diffuse large B-cell lymphoma. Patient is scheduled to follow up with Dr. Federico to finalize treatment recommendations on 06/29/24. Orders for port placement and echo were also placed. All of patient's questions were answered and he expressed understanding of the plan provided.

## 2024-06-28 ENCOUNTER — Other Ambulatory Visit (INDEPENDENT_AMBULATORY_CARE_PROVIDER_SITE_OTHER)

## 2024-06-28 DIAGNOSIS — Z0189 Encounter for other specified special examinations: Secondary | ICD-10-CM

## 2024-06-28 DIAGNOSIS — C8330 Diffuse large B-cell lymphoma, unspecified site: Secondary | ICD-10-CM

## 2024-06-28 LAB — ECHOCARDIOGRAM COMPLETE
AV Vena cont: 0.37 cm
Area-P 1/2: 5.27 cm2
MV M vel: 5.48 m/s
MV Peak grad: 120.1 mmHg
Radius: 0.7 cm
S' Lateral: 2.87 cm

## 2024-06-28 MED ORDER — PERFLUTREN LIPID MICROSPHERE
1.0000 mL | INTRAVENOUS | Status: AC | PRN
  Administered 2024-06-28: 1 mL via INTRAVENOUS

## 2024-06-29 ENCOUNTER — Other Ambulatory Visit: Payer: Self-pay | Admitting: *Deleted

## 2024-06-29 ENCOUNTER — Inpatient Hospital Stay: Attending: Physician Assistant | Admitting: Hematology and Oncology

## 2024-06-29 ENCOUNTER — Other Ambulatory Visit: Payer: Self-pay | Admitting: Hematology and Oncology

## 2024-06-29 VITALS — BP 133/92 | HR 89 | Temp 98.4°F | Resp 15 | Wt 173.5 lb

## 2024-06-29 DIAGNOSIS — Z79899 Other long term (current) drug therapy: Secondary | ICD-10-CM | POA: Diagnosis not present

## 2024-06-29 DIAGNOSIS — Z5111 Encounter for antineoplastic chemotherapy: Secondary | ICD-10-CM | POA: Insufficient documentation

## 2024-06-29 DIAGNOSIS — C8330 Diffuse large B-cell lymphoma, unspecified site: Secondary | ICD-10-CM

## 2024-06-29 DIAGNOSIS — C8333 Diffuse large B-cell lymphoma, intra-abdominal lymph nodes: Secondary | ICD-10-CM | POA: Insufficient documentation

## 2024-06-29 NOTE — Progress Notes (Signed)
 [facility] [phone]   [phone]) [phone]  PROGRESS NOTE  Patient Care Team: Amon Aloysius BRAVO, MD as PCP - General (Internal Medicine) Delford Maude BROCKS, MD as PCP - Cardiology (Cardiology) Timmy Maude SAUNDERS, MD as Consulting Physician (Oncology) Chauncey Redell Agent, MD as Consulting Physician (Urology)  Hematological/Oncological History # Diffuse Large B Cell Lymphoma, Stage III.  05/27/2024: establish care with Dr. Federico  06/02/2024: PET CT scan shows widespread metastatic disease to the nodal stations the abdomen, chest, and low neck. 06/23/2024: IR guided lymph node core biopsy confirms diffuse large B-cell lymphoma  Interval History:  Benjamin Adkins 83 y.o. male with medical history significant for diffuse large B cell lymphoma who presents for a follow up visit. The patient's last visit was on 05/27/2024. In the interim since the last visit he underwent a PET CT scan and biopsy which confirmed diffuse large B-cell lymphoma on both sides of the diaphragm.  On exam today Mr. Coles reports his biopsy procedure went well.  He is not having any residual pain, bleeding, or bruising at the site of the biopsy.  He reports that he has been well in the interim since her last visit with no fevers, chills, sweats, nausea, vomiting or diarrhea.  A full 10 point ROS is otherwise negative.  The bulk of our discussion focused on the diagnosis of diffuse large B-cell lymphoma, the expected treatment, and a side effects.  Additionally we talked about supportive medications.  Details of our conversation are noted below.  MEDICAL HISTORY:  Past Medical History:  Diagnosis Date   Arthritis    Coronary atherosclerosis of unspecified type of vessel, native or graft    MI 2006, h/o CABG   DJD (degenerative joint disease)    Elevated prostate specific antigen (PSA)    Elevated PSA    Esophageal reflux    GERD (gastroesophageal reflux disease)    HTN (hypertension)     Hyperlipidemia    nmr lipoprofile 2004: LDL 154 (1653/711), HDL 42, TG 80.NMR  LDL =,125, but PMH  of MI LDL goal=,70   Hypertension    Myocardial infarction [facility])    2006   Other cataract    Personal history of colonic polyps    Shortness of breath    DOE   Sleep apnea    uses CPAP    SURGICAL HISTORY: Past Surgical History:  Procedure Laterality Date   CARDIAC CATHETERIZATION     2006   CATARACT EXTRACTION Bilateral    COLONOSCOPY W/ POLYPECTOMY     CORONARY ARTERY BYPASS GRAFT  2006   X 4    EYE SURGERY     HERNIA REPAIR     VENTRAL, LEFT & RIGHT GROIN   IR US  LIVER BIOPSY  06/23/2024   TOTAL KNEE ARTHROPLASTY  2006   TOTAL KNEE ARTHROPLASTY  08/25/2011   Procedure: TOTAL KNEE ARTHROPLASTY;  Surgeon: Lamar DELENA Millman, MD;  Location: [facility] OR;  Service: Orthopedics;  Laterality: Right;  DR MILLMAN CHRISTIAN 90 MINUTES FOR SURGERY    SOCIAL HISTORY: Social History   Socioeconomic History   Marital status: Widowed    Spouse name: Not on file   Number of children: 1   Years of education: Not on file   Highest education level: Master's degree (e.g., MA, MS, MEng, MEd, MSW, MBA)  Occupational History   Occupation: retired  Tobacco Use   Smoking status: Former    Current packs/day: 0.00    Average packs/day: 0.3 packs/day  for 3.0 years (0.8 ttl pk-yrs)    Types: Cigarettes    Start date: 04/12/1959    Quit date: 08/12/1961    Years since quitting: 62.9   Smokeless tobacco: Never   Tobacco comments:    during college  Vaping Use   Vaping status: Never Used  Substance and Sexual Activity   Alcohol use: Yes    Alcohol/week: 1.0 standard drink of alcohol    Types: 1 Glasses of wine per week    Comment: with dinner occ   Drug use: No   Sexual activity: Yes  Other Topics Concern   Not on file  Social History Narrative   Lives alone.    Has a daughter in [place] , [place] G-son   Daughter: [name] [phone]   Social Drivers of Health   Tobacco Use: Medium Risk  (06/23/2024)   Patient History    Smoking Tobacco Use: Former    Smokeless Tobacco Use: Never    Passive Exposure: Not on Actuary Strain: Low Risk (05/13/2024)   Overall Financial Resource Strain (CARDIA)    Difficulty of Paying Living Expenses: Not hard at all  Food Insecurity: No Food Insecurity (05/26/2024)   Epic    Worried About Programme Researcher, Broadcasting/film/video in the Last Year: Never true    Ran Out of Food in the Last Year: Never true  Transportation Needs: No Transportation Needs (05/26/2024)   Epic    Lack of Transportation (Medical): No    Lack of Transportation (Non-Medical): No  Physical Activity: Sufficiently Active (05/13/2024)   Exercise Vital Sign    Days of Exercise per Week: 4 days    Minutes of Exercise per Session: 40 min  Stress: No Stress Concern Present (05/13/2024)   [employer] of Occupational Health - Occupational Stress Questionnaire    Feeling of Stress: Only a little  Social Connections: Moderately Integrated (05/13/2024)   Social Connection and Isolation Panel    Frequency of Communication with Friends and Family: Three times a week    Frequency of Social Gatherings with Friends and Family: Three times a week    Attends Religious Services: More than 4 times per year    Active Member of Clubs or Organizations: Yes    Attends Banker Meetings: More than 4 times per year    Marital Status: Widowed  Intimate Partner Violence: Not At Risk (03/07/2024)   Epic    Fear of Current or Ex-Partner: No    Emotionally Abused: No    Physically Abused: No    Sexually Abused: No  Depression (PHQ2-9): Low Risk (06/29/2024)   Depression (PHQ2-9)    PHQ-2 Score: 0  Alcohol Screen: Low Risk (05/13/2024)   Alcohol Screen    Last Alcohol Screening Score (AUDIT): 2  Housing: Unknown (05/26/2024)   Epic    Unable to Pay for Housing in the Last Year: No    Number of Times Moved in the Last Year: Not on file    Homeless in the Last Year: No   Utilities: Not At Risk (05/26/2024)   Epic    Threatened with loss of utilities: No  Health Literacy: Adequate Health Literacy (03/07/2024)   B1300 Health Literacy    Frequency of need for help with medical instructions: Never    FAMILY HISTORY: Family History  Problem Relation Age of Onset   Hypertension Mother    Breast cancer Mother    Coronary artery disease Mother    Heart disease  Mother    Arthritis Mother    Cancer Mother    Stomach cancer Father    Breast cancer Sister    Crohn's disease Sister    Arthritis Sister    Asthma Sister        childhood   Arthritis Brother    Heart disease Brother    Heart attack Paternal Grandfather 85   Colon cancer Neg Hx    Prostate cancer Neg Hx     ALLERGIES:  is allergic to short ragweed pollen ext.  MEDICATIONS:  Current Outpatient Medications  Medication Sig Dispense Refill   allopurinol  (ZYLOPRIM ) 300 MG tablet Take 1 tablet (300 mg total) by mouth daily. 90 tablet 1   benzonatate  (TESSALON ) 200 MG capsule Take 1 capsule (200 mg total) by mouth 3 (three) times daily as needed. 45 capsule 1   lidocaine -prilocaine  (EMLA ) cream Apply 1 Application topically as needed. 30 g 0   ondansetron  (ZOFRAN ) 8 MG tablet Take 1 tablet (8 mg total) by mouth every 8 (eight) hours as needed. 30 tablet 0   predniSONE  (DELTASONE ) 20 MG tablet Take 3 tablets (60 mg total) by mouth daily with breakfast. 15 tablet 5   triamcinolone ointment (KENALOG) 0.1 % Apply topically.     amoxicillin  (AMOXIL ) 500 MG capsule TAKE 4 CAPSULES BY MOUTH AS NEEDED FOR DENTAL PROCEDURE 20 capsule 0   aspirin  325 MG EC tablet Take 325 mg by mouth daily.     atorvastatin  (LIPITOR) 40 MG tablet Take 1 tablet (40 mg total) by mouth daily. 90 tablet 3   azelastine  (ASTELIN ) 0.1 % nasal spray Place 2 sprays into both nostrils 2 (two) times daily. Use in each nostril as directed 30 mL 12   celecoxib  (CELEBREX ) 100 MG capsule Take 1 capsule (100 mg total) by mouth 2 (two)  times daily as needed for moderate pain. 180 capsule 1   cetirizine (ZYRTEC) 10 MG tablet Take 10 mg by mouth daily.     ezetimibe  (ZETIA ) 10 MG tablet TAKE 1 TABLET BY MOUTH EVERY DAY 90 tablet 1   fluticasone  (FLONASE ) 50 MCG/ACT nasal spray Place 2 sprays into both nostrils daily. (Patient not taking: Reported on 06/29/2024) 16 g 6   HYDROcodone -acetaminophen  (NORCO/VICODIN) 5-325 MG tablet Take 0.5-1 tablets by mouth every 4 (four) hours as needed. (Patient not taking: Reported on 06/29/2024) 12 tablet 0   hydrocortisone  (ANUSOL -HC) 25 MG suppository Place 1 suppository (25 mg total) rectally 2 (two) times daily as needed for hemorrhoids. 12 suppository 1   metoprolol  succinate (TOPROL -XL) 25 MG 24 hr tablet Take 1 tablet (25 mg total) by mouth daily. KEEP JANUARY OFFICE VISIT. 90 tablet 0   Multiple Vitamin (MULTIVITAMIN) capsule Take 1 capsule by mouth daily.     nitroGLYCERIN  (NITROSTAT ) 0.4 MG SL tablet Place 1 tablet (0.4 mg total) under the tongue every 5 (five) minutes as needed. 25 tablet 3   omeprazole  (PRILOSEC) 40 MG capsule Take 1 capsule (40 mg total) by mouth daily. 30 capsule 3   prochlorperazine  (COMPAZINE ) 10 MG tablet TAKE 1 TABLET BY MOUTH EVERY 6 HOURS AS NEEDED FOR NAUSEA OR VOMITING. 30 tablet 0   No current facility-administered medications for this visit.    REVIEW OF SYSTEMS:   Constitutional: ( - ) fevers, ( - )  chills , ( - ) night sweats Eyes: ( - ) blurriness of vision, ( - ) double vision, ( - ) watery eyes Ears, nose, mouth, throat, and face: ( - )  mucositis, ( - ) sore throat Respiratory: ( - ) cough, ( - ) dyspnea, ( - ) wheezes Cardiovascular: ( - ) palpitation, ( - ) chest discomfort, ( - ) lower extremity swelling Gastrointestinal:  ( - ) nausea, ( - ) heartburn, ( - ) change in bowel habits Skin: ( - ) abnormal skin rashes Lymphatics: ( - ) new lymphadenopathy, ( - ) easy bruising Neurological: ( - ) numbness, ( - ) tingling, ( - ) new  weaknesses Behavioral/Psych: ( - ) mood change, ( - ) new changes  All other systems were reviewed with the patient and are negative.  PHYSICAL EXAMINATION: ECOG PERFORMANCE STATUS: 1 - Symptomatic but completely ambulatory  Vitals:   06/29/24 1137 06/29/24 1138  BP: (!) 133/103 (!) 133/92  Pulse: 89   Resp: 15   Temp: 98.4 F (36.9 C)   SpO2: 97%    Filed Weights   06/29/24 1137  Weight: 173 lb 8 oz (78.7 kg)    GENERAL: Well-appearing elderly Caucasian male, alert, no distress and comfortable SKIN: skin color, texture, turgor are normal, no rashes or significant lesions EYES: conjunctiva are pink and non-injected, sclera clear LUNGS: clear to auscultation and percussion with normal breathing effort HEART: regular rate & rhythm and no murmurs and no lower extremity edema Musculoskeletal: no cyanosis of digits and no clubbing  PSYCH: alert & oriented x 3, fluent speech NEURO: no focal motor/sensory deficits  LABORATORY DATA:  I have reviewed the data as listed    Latest Ref Rng & Units 06/23/2024   11:05 AM 05/27/2024    2:08 PM 04/27/2024    4:15 PM  CBC  WBC 4.0 - 10.5 K/uL 12.7  12.1  12.0   Hemoglobin 13.0 - 17.0 g/dL 87.9  86.6  87.4   Hematocrit 39.0 - 52.0 % 36.9  40.1  36.9   Platelets 150 - 400 K/uL 227  242  181        Latest Ref Rng & Units 05/27/2024    2:08 PM 04/27/2024    4:15 PM 08/25/2023   10:31 AM  CMP  Glucose 70 - 99 mg/dL 886  864  899   BUN 8 - 23 mg/dL 12  10  14    Creatinine 0.61 - 1.24 mg/dL 9.13  9.20  9.06   Sodium 135 - 145 mmol/L 137  131  137   Potassium 3.5 - 5.1 mmol/L 4.1  3.8  4.1   Chloride 98 - 111 mmol/L 99  96  103   CO2 22 - 32 mmol/L 30  22  27    Calcium  8.9 - 10.3 mg/dL 89.6  9.6  8.9   Total Protein 6.5 - 8.1 g/dL 8.2  7.1  6.6   Total Bilirubin 0.0 - 1.2 mg/dL 0.8  0.7  0.9   Alkaline Phos 38 - 126 U/L 120  121  98   AST 15 - 41 U/L 36  32  29   ALT 0 - 44 U/L 20  16  20      No results found for: MPROTEIN No  results found for: KPAFRELGTCHN, LAMBDASER, KAPLAMBRATIO  RADIOGRAPHIC STUDIES: ECHOCARDIOGRAM COMPLETE Result Date: 06/28/2024    ECHOCARDIOGRAM REPORT   Patient Name:   LOTUS GOVER Date of Exam: 06/28/2024 Medical Rec #:  [id number]     Height:       69.0 in Accession #:    [id number]    Weight:       166.0 lb  Date of Birth:  03/30/1941    BSA:          1.909 m Patient Age:    89 years      BP:           130/70 mmHg Patient Gender: M             HR:           101 bpm. Exam Location:  Outpatient Procedure: 3D Echo, 2D Echo, Cardiac Doppler, Color Doppler and Intracardiac            Opacification Agent (Both Spectral and Color Flow Doppler were            utilized during procedure). Indications:    Chemo  History:        Patient has no prior history of Echocardiogram examinations.                 Previous Myocardial Infarction, Arrythmias:RBBB; Risk                 Factors:Hypertension and Former Smoker.  Sonographer:    Orvil Holmes RDCS Referring Phys: [id number] KAITLYN E WALISIEWICZ IMPRESSIONS  1. Left ventricular ejection fraction, by estimation, is 55 to 60%. Left ventricular ejection fraction by 3D volume is 56 %. The left ventricle has normal function. The left ventricle has no regional wall motion abnormalities. There is moderate left ventricular hypertrophy of the anterior segment. Left ventricular diastolic parameters are indeterminate.  2. Right ventricular systolic function is normal. The right ventricular size is mildly enlarged.  3. Left atrial size was severely dilated.  4. The mitral valve is normal in structure. Moderate mitral valve regurgitation. No evidence of mitral stenosis.  5. The aortic valve is tricuspid. Aortic valve regurgitation is mild. No aortic stenosis is present.  6. The inferior vena cava is normal in size with greater than 50% respiratory variability, suggesting right atrial pressure of 3 mmHg. FINDINGS  Left Ventricle: Left ventricular ejection fraction, by  estimation, is 55 to 60%. Left ventricular ejection fraction by 3D volume is 56 %. The left ventricle has normal function. The left ventricle has no regional wall motion abnormalities. Definity  contrast agent was given IV to delineate the left ventricular endocardial borders. The left ventricular internal cavity size was normal in size. There is moderate left ventricular hypertrophy of the anterior segment. Left ventricular diastolic parameters  are indeterminate. Right Ventricle: The right ventricular size is mildly enlarged. No increase in right ventricular wall thickness. Right ventricular systolic function is normal. Left Atrium: Left atrial size was severely dilated. Right Atrium: Right atrial size was normal in size. Pericardium: There is no evidence of pericardial effusion. Mitral Valve: The mitral valve is normal in structure. Mild mitral annular calcification. Moderate mitral valve regurgitation. No evidence of mitral valve stenosis. Tricuspid Valve: The tricuspid valve is normal in structure. Tricuspid valve regurgitation is not demonstrated. No evidence of tricuspid stenosis. Aortic Valve: The aortic valve is tricuspid. Aortic valve regurgitation is mild. No aortic stenosis is present. Pulmonic Valve: The pulmonic valve was normal in structure. Pulmonic valve regurgitation is not visualized. No evidence of pulmonic stenosis. Aorta: The aortic root is normal in size and structure. Venous: The inferior vena cava is normal in size with greater than 50% respiratory variability, suggesting right atrial pressure of 3 mmHg. IAS/Shunts: No atrial level shunt detected by color flow Doppler. Additional Comments: 3D was performed not requiring image post processing on an independent workstation and was normal.  LEFT VENTRICLE PLAX 2D LVIDd:         4.25 cm         Diastology LVIDs:         2.87 cm         LV e' medial:    6.85 cm/s LV PW:         1.12 cm         LV E/e' medial:  15.6 LV IVS:        1.58 cm          LV e' lateral:   13.70 cm/s LVOT diam:     2.00 cm         LV E/e' lateral: 7.8 LV SV:         48 LV SV Index:   25 LVOT Area:     3.14 cm        3D Volume EF                                LV 3D EF:    Left                                             ventricul                                             ar                                             ejection                                             fraction                                             by 3D                                             volume is                                             56 %.                                 3D Volume EF:                                3D EF:        56 %  LV EDV:       124 ml                                LV ESV:       55 ml                                LV SV:        69 ml RIGHT VENTRICLE RV Basal diam:  4.24 cm RV Mid diam:    2.95 cm RV S prime:     9.32 cm/s TAPSE (M-mode): 1.6 cm LEFT ATRIUM              Index        RIGHT ATRIUM           Index LA diam:        5.50 cm  2.88 cm/m   RA Area:     16.70 cm LA Vol (A2C):   117.0 ml 61.30 ml/m  RA Volume:   44.30 ml  23.21 ml/m LA Vol (A4C):   91.4 ml  47.89 ml/m LA Biplane Vol: 105.0 ml 55.01 ml/m  AORTIC VALVE LVOT Vmax:         97.50 cm/s LVOT Vmean:        63.900 cm/s LVOT VTI:          0.152 m AR Vena Contracta: 0.36 cm  AORTA Ao Root diam: 3.60 cm Ao Asc diam:  3.90 cm MITRAL VALVE MV Area (PHT): 5.27 cm       SHUNTS MV Decel Time: 144 msec       Systemic VTI:  0.15 m MR Peak grad:    120.1 mmHg   Systemic Diam: 2.00 cm MR Mean grad:    75.0 mmHg MR Vmax:         548.00 cm/s MR Vmean:        404.0 cm/s MR PISA:         3.08 cm MR PISA Eff ROA: 17 mm MR PISA Radius:  0.70 cm MV E velocity: 107.00 cm/s MV A velocity: 36.70 cm/s MV E/A ratio:  2.92 Oneil Parchment MD Electronically signed by Oneil Parchment MD Signature Date/Time: 06/28/2024/1:37:10 PM    Final    IR LYMPH NODE CORE BIOPSY Result Date: 06/23/2024 INDICATION:  Metastatic disease and needs a tissue diagnosis. EXAM: Ultrasound-guided core biopsy of left supraclavicular lymph node/nodal mass MEDICATIONS: Moderate sedation ANESTHESIA/SEDATION: Moderate (conscious) sedation was employed during this procedure. A total of Versed  2 mg and Fentanyl  50 mcg was administered intravenously by the radiology nurse. Total intra-service moderate Sedation Time: 15 minutes. The patient's level of consciousness and vital signs were monitored continuously by radiology nursing throughout the procedure under my direct supervision. FLUOROSCOPY TIME:  None COMPLICATIONS: None immediate. PROCEDURE: Informed written consent was obtained from the patient after a thorough discussion of the procedural risks, benefits and alternatives. All questions were addressed. Maximal Sterile Barrier Technique was utilized including caps, mask, sterile gowns, sterile gloves, sterile drape, hand hygiene and skin antiseptic. A timeout was performed prior to the initiation of the procedure. Left supraclavicular nodal mass was identified with ultrasound. Left side of the neck was prepped and draped in sterile fashion. Skin was anesthetized using 1% lidocaine . Small incision was made. Using ultrasound guidance, 17 gauge coaxial needle was directed into the nodal mass. Multiple core  biopsies were obtained with an 18 gauge device. Specimens placed in saline. 17 gauge needles removed. Bandage placed over the puncture site. Ultrasound images were taken and saved for this procedure. FINDINGS: Large hypoechoic nodal mass in the left supraclavicular region. Biopsy needle confirmed within the nodal mass. Adequate core specimens obtained. IMPRESSION: Ultrasound-guided core biopsy of the left supraclavicular nodal mass. Electronically Signed   By: Juliene Balder M.D.   On: 06/23/2024 14:02    ASSESSMENT & PLAN Mr. Amogh Komatsu is an 83 year old male with newly diagnosis Diffuse Large B Cell Lymphoma who presents for a follow up  visit.   # Diffuse Large B Cell Lymphoma, Stage III --findings are consistent with a DLBCL, further testing pending for possible double hit lymphoma -- plan to proceed with R-miniCHOP chemotherapy given patients age and health status -- Supportive medications as noted below. -- Will attempt to start therapy on 07/08/2024 or as soon as is feasible.  Additionally patient will require chemotherapy education --Will plan for interval PET CT scan after 3 cycles of treatment, before cycle 4.  Additionally will plan for a posttreatment PET CT scan -- Will plan to see patient on cycle 1 day 1 of treatment  #Supportive Care -- chemotherapy education to be scheduled  -- port placement to be done on 07/07/2024  -- zofran  8mg  q8H PRN and compazine  10mg  PO q6H for nausea -- allopurinol  300mg  PO daily for TLS prophylaxis -- EMLA  cream for port -- no pain medication required at this time.    Orders Placed This Encounter  Procedures   CBC with Differential (Cancer Center Only)    Standing Status:   Future    Expected Date:   07/08/2024    Expiration Date:   07/08/2025   CMP (Cancer Center only)    Standing Status:   Future    Expected Date:   07/08/2024    Expiration Date:   07/08/2025    All questions were answered. The patient knows to call the clinic with any problems, questions or concerns.  A total of more than 30 minutes were spent on this encounter with face-to-face time and non-face-to-face time, including preparing to see the patient, ordering tests and/or medications, counseling the patient and coordination of care as outlined above.   Norleen IVAR Kidney, MD Department of Hematology/Oncology [facility] Phone: [phone] Pager: [phone] Email: [email or web address]  07/03/2024 7:33 PM

## 2024-06-29 NOTE — Progress Notes (Signed)
 Referrals for Social Work and Dietary sent per pt request

## 2024-06-30 ENCOUNTER — Encounter: Payer: Self-pay | Admitting: Hematology and Oncology

## 2024-06-30 ENCOUNTER — Telehealth: Payer: Self-pay | Admitting: Hematology and Oncology

## 2024-06-30 DIAGNOSIS — C8330 Diffuse large B-cell lymphoma, unspecified site: Secondary | ICD-10-CM | POA: Insufficient documentation

## 2024-06-30 MED ORDER — LIDOCAINE-PRILOCAINE 2.5-2.5 % EX CREA: 1.0000 | TOPICAL_CREAM | CUTANEOUS | 0 refills | Status: AC | PRN

## 2024-06-30 MED ORDER — ALLOPURINOL 300 MG PO TABS: 300.0000 mg | ORAL_TABLET | Freq: Every day | ORAL | 1 refills | Status: AC

## 2024-06-30 MED ORDER — PROCHLORPERAZINE MALEATE 10 MG PO TABS: 10.0000 mg | ORAL_TABLET | Freq: Four times a day (QID) | ORAL | 0 refills | Status: DC | PRN

## 2024-06-30 MED ORDER — PREDNISONE 20 MG PO TABS: 60.0000 mg | ORAL_TABLET | Freq: Every day | ORAL | 5 refills | Status: AC

## 2024-06-30 MED ORDER — ONDANSETRON HCL 8 MG PO TABS: 8.0000 mg | ORAL_TABLET | Freq: Three times a day (TID) | ORAL | 0 refills | Status: AC | PRN

## 2024-06-30 NOTE — Progress Notes (Signed)
 START ON PATHWAY REGIMEN - Lymphoma and CLL     A cycle is every 21 days:     Prednisone       Rituximab-xxxx      Cyclophosphamide      Doxorubicin      Vincristine   **Always confirm dose/schedule in your pharmacy ordering system**  Patient Characteristics: Diffuse Large B-Cell Lymphoma or Follicular Lymphoma, Grade 3B, First Line, Stage III and IV Disease Type: Not Applicable Disease Type: Not Applicable Disease Type: Diffuse Large B-Cell Lymphoma Line of therapy: First Line Intent of Therapy: Curative Intent, Discussed with Patient

## 2024-07-01 ENCOUNTER — Other Ambulatory Visit: Payer: Self-pay

## 2024-07-01 ENCOUNTER — Other Ambulatory Visit: Payer: Self-pay | Admitting: Hematology and Oncology

## 2024-07-03 ENCOUNTER — Encounter: Payer: Self-pay | Admitting: Hematology and Oncology

## 2024-07-04 ENCOUNTER — Telehealth: Payer: Self-pay | Admitting: Pharmacist

## 2024-07-04 ENCOUNTER — Other Ambulatory Visit: Payer: Self-pay | Admitting: Internal Medicine

## 2024-07-04 MED ORDER — ATORVASTATIN CALCIUM 40 MG PO TABS: 40.0000 mg | ORAL_TABLET | Freq: Every day | ORAL | 0 refills | Status: AC

## 2024-07-04 NOTE — Telephone Encounter (Signed)
 Staff message sent to discuss w/ PCP on his return.

## 2024-07-04 NOTE — Progress Notes (Signed)
 Pharmacy Quality Measure Review  This patient is appearing on a report for being at risk of failing the adherence measure for cholesterol (statin) medications this calendar year.   Medication: atorvastatin   Last fill date: 01/26/2024 for 90 day supply  Spoke with Mr. Geske today. He state he has about 10 tablets of atorvastatin  left and will need a refill sent in.  Sent in Rx for #100 and no refills. He sees Dr Delford in January 2026 and Dr Amon in February 2026.   Patient also wanted Dr. Amon to know that he will start treatment for lymphoma on 12/19 Forwarding note to Dr. Amon but he will be our of office until 07/11/2024.   Meds ordered this encounter  Medications   atorvastatin  (LIPITOR) 40 MG tablet    Sig: Take 1 tablet (40 mg total) by mouth daily.    Dispense:  100 tablet    Refill:  0   Madelin Ray, PharmD Clinical Pharmacist [facility]

## 2024-07-04 NOTE — Progress Notes (Signed)
 Pharmacist Chemotherapy Monitoring - Initial Assessment    Anticipated start date: 07/08/24   The following has been reviewed per standard work regarding the patient's treatment regimen: The patient's diagnosis, treatment plan and drug doses, and organ/hematologic function Lab orders and baseline tests specific to treatment regimen  The treatment plan start date, drug sequencing, and pre-medications Prior authorization status  Patient's documented medication list, including drug-drug interaction screen and prescriptions for anti-emetics and supportive care specific to the treatment regimen The drug concentrations, fluid compatibility, administration routes, and timing of the medications to be used The patient's access for treatment and lifetime cumulative dose history, if applicable  The patient's medication allergies and previous infusion related reactions, if applicable   Changes made to treatment plan:  N/A  Follow up needed:  1) f/u for Hep B labs   Bridgett Leotis Helling, RPH, BCPS, BCOP 07/04/2024   11:25 AM

## 2024-07-05 ENCOUNTER — Inpatient Hospital Stay

## 2024-07-05 DIAGNOSIS — C8330 Diffuse large B-cell lymphoma, unspecified site: Secondary | ICD-10-CM

## 2024-07-05 DIAGNOSIS — Z5111 Encounter for antineoplastic chemotherapy: Secondary | ICD-10-CM | POA: Diagnosis not present

## 2024-07-05 LAB — LACTATE DEHYDROGENASE: LDH: 674 U/L — ABNORMAL HIGH (ref 105–235)

## 2024-07-05 LAB — CMP (CANCER CENTER ONLY)
ALT: 34 U/L (ref 0–44)
AST: 58 U/L — ABNORMAL HIGH (ref 15–41)
Albumin: 3.6 g/dL (ref 3.5–5.0)
Alkaline Phosphatase: 104 U/L (ref 38–126)
Anion gap: 13 (ref 5–15)
BUN: 14 mg/dL (ref 8–23)
CO2: 26 mmol/L (ref 22–32)
Calcium: 10.6 mg/dL — ABNORMAL HIGH (ref 8.9–10.3)
Chloride: 94 mmol/L — ABNORMAL LOW (ref 98–111)
Creatinine: 0.96 mg/dL (ref 0.61–1.24)
GFR, Estimated: >60 mL/min (ref >60)
Glucose, Bld: 106 mg/dL — ABNORMAL HIGH (ref 70–99)
Potassium: 3.4 mmol/L — ABNORMAL LOW (ref 3.5–5.1)
Sodium: 133 mmol/L — ABNORMAL LOW (ref 135–145)
Total Bilirubin: 0.9 mg/dL (ref 0.0–1.2)
Total Protein: 7.5 g/dL (ref 6.5–8.1)

## 2024-07-05 LAB — CBC WITH DIFFERENTIAL (CANCER CENTER ONLY)
Abs Immature Granulocytes: 0.08 K/uL — ABNORMAL HIGH (ref 0.00–0.07)
Basophils Absolute: 0 K/uL (ref 0.0–0.1)
Basophils Relative: 0 %
Eosinophils Absolute: 0.1 K/uL (ref 0.0–0.5)
Eosinophils Relative: 0 %
HCT: 38.4 % — ABNORMAL LOW (ref 39.0–52.0)
Hemoglobin: 13 g/dL (ref 13.0–17.0)
Immature Granulocytes: 1 %
Lymphocytes Relative: 8 %
Lymphs Abs: 1.2 K/uL (ref 0.7–4.0)
MCH: 26.9 pg (ref 26.0–34.0)
MCHC: 33.9 g/dL (ref 30.0–36.0)
MCV: 79.5 fL — ABNORMAL LOW (ref 80.0–100.0)
Monocytes Absolute: 1.1 K/uL — ABNORMAL HIGH (ref 0.1–1.0)
Monocytes Relative: 7 %
Neutro Abs: 12.5 K/uL — ABNORMAL HIGH (ref 1.7–7.7)
Neutrophils Relative %: 84 %
Platelet Count: 355 K/uL (ref 150–400)
RBC: 4.83 MIL/uL (ref 4.22–5.81)
RDW: 14.9 % (ref 11.5–15.5)
WBC Count: 15 K/uL — ABNORMAL HIGH (ref 4.0–10.5)
nRBC: 0 % (ref 0.0–0.2)

## 2024-07-05 LAB — HEPATITIS B SURFACE ANTIBODY,QUALITATIVE: Hep B S Ab: NONREACTIVE

## 2024-07-05 LAB — URIC ACID: Uric Acid, Serum: 5.9 mg/dL (ref 3.7–8.6)

## 2024-07-06 ENCOUNTER — Other Ambulatory Visit: Payer: Self-pay | Admitting: Radiology

## 2024-07-06 LAB — HEPATITIS B CORE ANTIBODY, TOTAL: HEP B CORE AB: NEGATIVE

## 2024-07-06 LAB — HEPATITIS B SURFACE ANTIGEN: Hepatitis B Surface Ag: NONREACTIVE

## 2024-07-06 NOTE — H&P (Signed)
 Chief Complaint: Diffuse large B-cell lymphoma; referred for Port-A-Cath placement to assist with treatment  Referring Provider(s): Dorsey,J  Supervising Physician: Luverne Aran  Patient Status: [facility] - Out-pt  History of Present Illness: Benjamin Adkins is a 83 y.o. male ex smoker with past medical history significant for arthritis, coronary artery disease with prior MI/CABG, DJD, gastroesophageal reflux, hypertension, hyperlipidemia, sleep apnea, right bundle blanch block, elevated PSA who presents now with diagnosed diffuse large B-cell lymphoma.  He is scheduled today for Port-A-Cath placement to assist with treatment.  *** Patient is Full Code  Past Medical History:  Diagnosis Date   Arthritis    Coronary atherosclerosis of unspecified type of vessel, native or graft    MI 2006, h/o CABG   DJD (degenerative joint disease)    Elevated prostate specific antigen (PSA)    Elevated PSA    Esophageal reflux    GERD (gastroesophageal reflux disease)    HTN (hypertension)    Hyperlipidemia    nmr lipoprofile 2004: LDL 154 (1653/711), HDL 42, TG 80.NMR  LDL =,125, but PMH  of MI LDL goal=,70   Hypertension    Myocardial infarction [facility])    2006   Other cataract    Personal history of colonic polyps    Shortness of breath    DOE   Sleep apnea    uses CPAP    Past Surgical History:  Procedure Laterality Date   CARDIAC CATHETERIZATION     2006   CATARACT EXTRACTION Bilateral    COLONOSCOPY W/ POLYPECTOMY     CORONARY ARTERY BYPASS GRAFT  2006   X 4    EYE SURGERY     HERNIA REPAIR     VENTRAL, LEFT & RIGHT GROIN   IR US  LIVER BIOPSY  06/23/2024   TOTAL KNEE ARTHROPLASTY  2006   TOTAL KNEE ARTHROPLASTY  08/25/2011   Procedure: TOTAL KNEE ARTHROPLASTY;  Surgeon: Lamar DELENA Millman, MD;  Location: [facility] OR;  Service: Orthopedics;  Laterality: Right;  DR MILLMAN WANTS 90 MINUTES FOR SURGERY    Allergies: Short ragweed pollen ext  Medications: Prior to Admission medications   Medication Sig Start Date End Date Taking? Authorizing Provider  allopurinol  (ZYLOPRIM ) 300 MG tablet Take 1 tablet (300 mg total) by mouth daily. 06/30/24   Federico Norleen ONEIDA MADISON, MD  amoxicillin  (AMOXIL ) 500 MG capsule TAKE 4 CAPSULES BY MOUTH AS NEEDED FOR DENTAL PROCEDURE 04/01/22   Paz, Jose E, MD  aspirin  325 MG EC tablet Take 325 mg by mouth daily.    [provider]  atorvastatin  (LIPITOR) 40 MG tablet Take 1 tablet (40 mg total) by mouth daily. 07/04/24   Domenica Harlene DELENA, MD  azelastine  (ASTELIN ) 0.1 % nasal spray Place 2 sprays into both nostrils 2 (two) times daily. Use in each nostril as directed 03/16/24   Almarie Waddell NOVAK, NP  benzonatate  (TESSALON ) 200 MG capsule Take 1 capsule (200 mg total) by mouth 3 (three) times daily as needed. 03/22/24 03/22/25  Parrett, Madelin RAMAN, NP  celecoxib  (CELEBREX ) 100 MG capsule Take 1 capsule (100 mg total) by mouth 2 (two) times daily as needed for moderate pain. 08/30/19   Paz, Jose E, MD  cetirizine (ZYRTEC) 10 MG tablet Take 10 mg by mouth daily.    [provider]  ezetimibe  (ZETIA ) 10 MG tablet Take 1 tablet (10 mg total) by mouth daily. 07/04/24   Amon Aloysius BRAVO, MD  fluticasone  (FLONASE ) 50 MCG/ACT nasal spray Place 2 sprays into  both nostrils daily. Patient not taking: Reported on 06/29/2024 03/16/24   Almarie Birmingham B, NP  HYDROcodone -acetaminophen  (NORCO/VICODIN) 5-325 MG tablet Take 0.5-1 tablets by mouth every 4 (four) hours as needed. Patient not taking: Reported on 06/29/2024 04/27/24   Odell Balls, PA-C  hydrocortisone  (ANUSOL -HC) 25 MG suppository Place 1 suppository (25 mg total) rectally 2 (two) times daily as needed for hemorrhoids. 09/22/23   Paz, Jose E, MD  lidocaine -prilocaine  (EMLA ) cream Apply 1 Application topically as needed. 06/30/24   Federico Norleen ONEIDA MADISON, MD  metoprolol  succinate (TOPROL -XL) 25 MG 24 hr tablet Take 1 tablet (25 mg total) by mouth daily. KEEP JANUARY OFFICE VISIT. 06/13/24   Nishan, Peter C, MD  Multiple  Vitamin (MULTIVITAMIN) capsule Take 1 capsule by mouth daily.    [provider]  nitroGLYCERIN  (NITROSTAT ) 0.4 MG SL tablet Place 1 tablet (0.4 mg total) under the tongue every 5 (five) minutes as needed. 11/26/18   Delford Maude BROCKS, MD  omeprazole  (PRILOSEC) 40 MG capsule Take 1 capsule (40 mg total) by mouth daily. 05/13/24   Webb, Padonda B, FNP  ondansetron  (ZOFRAN ) 8 MG tablet Take 1 tablet (8 mg total) by mouth every 8 (eight) hours as needed. 06/30/24   Federico Norleen ONEIDA MADISON, MD  predniSONE  (DELTASONE ) 20 MG tablet Take 3 tablets (60 mg total) by mouth daily with breakfast. 06/30/24   Federico Norleen ONEIDA MADISON, MD  prochlorperazine  (COMPAZINE ) 10 MG tablet TAKE 1 TABLET BY MOUTH EVERY 6 HOURS AS NEEDED FOR NAUSEA OR VOMITING. 07/01/24   Dorsey, John T IV, MD  triamcinolone ointment (KENALOG) 0.1 % Apply topically. 06/23/24   [provider]     Family History  Problem Relation Age of Onset   Hypertension Mother    Breast cancer Mother    Coronary artery disease Mother    Heart disease Mother    Arthritis Mother    Cancer Mother    Stomach cancer Father    Breast cancer Sister    Crohn's disease Sister    Arthritis Sister    Asthma Sister        childhood   Arthritis Brother    Heart disease Brother    Heart attack Paternal Grandfather 69   Colon cancer Neg Hx    Prostate cancer Neg Hx     Social History   Socioeconomic History   Marital status: Widowed    Spouse name: Not on file   Number of children: 1   Years of education: Not on file   Highest education level: Master's degree (e.g., MA, MS, MEng, MEd, MSW, MBA)  Occupational History   Occupation: retired  Tobacco Use   Smoking status: Former    Current packs/day: 0.00    Average packs/day: 0.3 packs/day for 3.0 years (0.8 ttl pk-yrs)    Types: Cigarettes    Start date: 04/12/1959    Quit date: 08/12/1961    Years since quitting: 62.9   Smokeless tobacco: Never   Tobacco comments:    during college  Vaping  Use   Vaping status: Never Used  Substance and Sexual Activity   Alcohol use: Yes    Alcohol/week: 1.0 standard drink of alcohol    Types: 1 Glasses of wine per week    Comment: with dinner occ   Drug use: No   Sexual activity: Yes  Other Topics Concern   Not on file  Social History Narrative   Lives alone.    Has a daughter in [place] ,  1 G-son   Daughter: [name] [phone]   Social Drivers of Health   Tobacco Use: Medium Risk (06/23/2024)   Patient History    Smoking Tobacco Use: Former    Smokeless Tobacco Use: Never    Passive Exposure: Not on file  Financial Resource Strain: Low Risk (05/13/2024)   Overall Financial Resource Strain (CARDIA)    Difficulty of Paying Living Expenses: Not hard at all  Food Insecurity: No Food Insecurity (05/26/2024)   Epic    Worried About Programme Researcher, Broadcasting/film/video in the Last Year: Never true    Ran Out of Food in the Last Year: Never true  Transportation Needs: No Transportation Needs (05/26/2024)   Epic    Lack of Transportation (Medical): No    Lack of Transportation (Non-Medical): No  Physical Activity: Sufficiently Active (05/13/2024)   Exercise Vital Sign    Days of Exercise per Week: 4 days    Minutes of Exercise per Session: 40 min  Stress: No Stress Concern Present (05/13/2024)   [employer] of Occupational Health - Occupational Stress Questionnaire    Feeling of Stress: Only a little  Social Connections: Moderately Integrated (05/13/2024)   Social Connection and Isolation Panel    Frequency of Communication with Friends and Family: Three times a week    Frequency of Social Gatherings with Friends and Family: Three times a week    Attends Religious Services: More than 4 times per year    Active Member of Clubs or Organizations: Yes    Attends Banker Meetings: More than 4 times per year    Marital Status: Widowed  Depression (PHQ2-9): Low Risk (06/29/2024)   Depression (PHQ2-9)    PHQ-2 Score: 0  Alcohol  Screen: Low Risk (05/13/2024)   Alcohol Screen    Last Alcohol Screening Score (AUDIT): 2  Housing: Unknown (05/26/2024)   Epic    Unable to Pay for Housing in the Last Year: No    Number of Times Moved in the Last Year: Not on file    Homeless in the Last Year: No  Utilities: Not At Risk (05/26/2024)   Epic    Threatened with loss of utilities: No  Health Literacy: Adequate Health Literacy (03/07/2024)   B1300 Health Literacy    Frequency of need for help with medical instructions: Never       Review of Systems  Vital Signs:   Advance Care Plan: no documents on file    Physical Exam  Imaging: ECHOCARDIOGRAM COMPLETE Result Date: 06/28/2024    ECHOCARDIOGRAM REPORT   Patient Name:   GIANN OBARA Date of Exam: 06/28/2024 Medical Rec #:  [id number]     Height:       69.0 in Accession #:    [id number]    Weight:       166.0 lb Date of Birth:  July 11, 1941    BSA:          1.909 m Patient Age:    83 years      BP:           130/70 mmHg Patient Gender: M             HR:           101 bpm. Exam Location:  Outpatient Procedure: 3D Echo, 2D Echo, Cardiac Doppler, Color Doppler and Intracardiac            Opacification Agent (Both Spectral and Color Flow Doppler were  utilized during procedure). Indications:    Chemo  History:        Patient has no prior history of Echocardiogram examinations.                 Previous Myocardial Infarction, Arrythmias:RBBB; Risk                 Factors:Hypertension and Former Smoker.  Sonographer:    Orvil Holmes RDCS Referring Phys: [id number] KAITLYN E WALISIEWICZ IMPRESSIONS  1. Left ventricular ejection fraction, by estimation, is 55 to 60%. Left ventricular ejection fraction by 3D volume is 56 %. The left ventricle has normal function. The left ventricle has no regional wall motion abnormalities. There is moderate left ventricular hypertrophy of the anterior segment. Left ventricular diastolic parameters are indeterminate.  2. Right ventricular  systolic function is normal. The right ventricular size is mildly enlarged.  3. Left atrial size was severely dilated.  4. The mitral valve is normal in structure. Moderate mitral valve regurgitation. No evidence of mitral stenosis.  5. The aortic valve is tricuspid. Aortic valve regurgitation is mild. No aortic stenosis is present.  6. The inferior vena cava is normal in size with greater than 50% respiratory variability, suggesting right atrial pressure of 3 mmHg. FINDINGS  Left Ventricle: Left ventricular ejection fraction, by estimation, is 55 to 60%. Left ventricular ejection fraction by 3D volume is 56 %. The left ventricle has normal function. The left ventricle has no regional wall motion abnormalities. Definity  contrast agent was given IV to delineate the left ventricular endocardial borders. The left ventricular internal cavity size was normal in size. There is moderate left ventricular hypertrophy of the anterior segment. Left ventricular diastolic parameters  are indeterminate. Right Ventricle: The right ventricular size is mildly enlarged. No increase in right ventricular wall thickness. Right ventricular systolic function is normal. Left Atrium: Left atrial size was severely dilated. Right Atrium: Right atrial size was normal in size. Pericardium: There is no evidence of pericardial effusion. Mitral Valve: The mitral valve is normal in structure. Mild mitral annular calcification. Moderate mitral valve regurgitation. No evidence of mitral valve stenosis. Tricuspid Valve: The tricuspid valve is normal in structure. Tricuspid valve regurgitation is not demonstrated. No evidence of tricuspid stenosis. Aortic Valve: The aortic valve is tricuspid. Aortic valve regurgitation is mild. No aortic stenosis is present. Pulmonic Valve: The pulmonic valve was normal in structure. Pulmonic valve regurgitation is not visualized. No evidence of pulmonic stenosis. Aorta: The aortic root is normal in size and structure.  Venous: The inferior vena cava is normal in size with greater than 50% respiratory variability, suggesting right atrial pressure of 3 mmHg. IAS/Shunts: No atrial level shunt detected by color flow Doppler. Additional Comments: 3D was performed not requiring image post processing on an independent workstation and was normal.  LEFT VENTRICLE PLAX 2D LVIDd:         4.25 cm         Diastology LVIDs:         2.87 cm         LV e' medial:    6.85 cm/s LV PW:         1.12 cm         LV E/e' medial:  15.6 LV IVS:        1.58 cm         LV e' lateral:   13.70 cm/s LVOT diam:     2.00 cm  LV E/e' lateral: 7.8 LV SV:         48 LV SV Index:   25 LVOT Area:     3.14 cm        3D Volume EF                                LV 3D EF:    Left                                             ventricul                                             ar                                             ejection                                             fraction                                             by 3D                                             volume is                                             56 %.                                 3D Volume EF:                                3D EF:        56 %                                LV EDV:       124 ml                                LV ESV:       55 ml                                LV SV:        69 ml RIGHT VENTRICLE RV Basal  diam:  4.24 cm RV Mid diam:    2.95 cm RV S prime:     9.32 cm/s TAPSE (M-mode): 1.6 cm LEFT ATRIUM              Index        RIGHT ATRIUM           Index LA diam:        5.50 cm  2.88 cm/m   RA Area:     16.70 cm LA Vol (A2C):   117.0 ml 61.30 ml/m  RA Volume:   44.30 ml  23.21 ml/m LA Vol (A4C):   91.4 ml  47.89 ml/m LA Biplane Vol: 105.0 ml 55.01 ml/m  AORTIC VALVE LVOT Vmax:         97.50 cm/s LVOT Vmean:        63.900 cm/s LVOT VTI:          0.152 m AR Vena Contracta: 0.36 cm  AORTA Ao Root diam: 3.60 cm Ao Asc diam:  3.90 cm MITRAL VALVE MV Area (PHT):  5.27 cm       SHUNTS MV Decel Time: 144 msec       Systemic VTI:  0.15 m MR Peak grad:    120.1 mmHg   Systemic Diam: 2.00 cm MR Mean grad:    75.0 mmHg MR Vmax:         548.00 cm/s MR Vmean:        404.0 cm/s MR PISA:         3.08 cm MR PISA Eff ROA: 17 mm MR PISA Radius:  0.70 cm MV E velocity: 107.00 cm/s MV A velocity: 36.70 cm/s MV E/A ratio:  2.92 Oneil Parchment MD Electronically signed by Oneil Parchment MD Signature Date/Time: 06/28/2024/1:37:10 PM    Final    IR LYMPH NODE CORE BIOPSY Result Date: 06/23/2024 INDICATION: Metastatic disease and needs a tissue diagnosis. EXAM: Ultrasound-guided core biopsy of left supraclavicular lymph node/nodal mass MEDICATIONS: Moderate sedation ANESTHESIA/SEDATION: Moderate (conscious) sedation was employed during this procedure. A total of Versed  2 mg and Fentanyl  50 mcg was administered intravenously by the radiology nurse. Total intra-service moderate Sedation Time: 15 minutes. The patient's level of consciousness and vital signs were monitored continuously by radiology nursing throughout the procedure under my direct supervision. FLUOROSCOPY TIME:  None COMPLICATIONS: None immediate. PROCEDURE: Informed written consent was obtained from the patient after a thorough discussion of the procedural risks, benefits and alternatives. All questions were addressed. Maximal Sterile Barrier Technique was utilized including caps, mask, sterile gowns, sterile gloves, sterile drape, hand hygiene and skin antiseptic. A timeout was performed prior to the initiation of the procedure. Left supraclavicular nodal mass was identified with ultrasound. Left side of the neck was prepped and draped in sterile fashion. Skin was anesthetized using 1% lidocaine . Small incision was made. Using ultrasound guidance, 17 gauge coaxial needle was directed into the nodal mass. Multiple core biopsies were obtained with an 18 gauge device. Specimens placed in saline. 17 gauge needles removed. Bandage placed  over the puncture site. Ultrasound images were taken and saved for this procedure. FINDINGS: Large hypoechoic nodal mass in the left supraclavicular region. Biopsy needle confirmed within the nodal mass. Adequate core specimens obtained. IMPRESSION: Ultrasound-guided core biopsy of the left supraclavicular nodal mass. Electronically Signed   By: Juliene Balder M.D.   On: 06/23/2024 14:02    Labs:  CBC: Recent Labs    04/27/24 1615 05/27/24 1408 06/23/24 1105 07/05/24 1330  WBC 12.0* 12.1* 12.7* 15.0*  HGB 12.5* 13.3 12.0* 13.0  HCT 36.9* 40.1 36.9* 38.4*  PLT 181 242 227 355    COAGS: Recent Labs    06/23/24 1105  INR 1.1    BMP: Recent Labs    08/25/23 1031 04/27/24 1615 05/27/24 1408 07/05/24 1330  NA 137 131* 137 133*  K 4.1 3.8 4.1 3.4*  CL 103 96* 99 94*  CO2 27 22 30 26   GLUCOSE 100* 135* 113* 106*  BUN 14 10 12 14   CALCIUM  8.9 9.6 10.3 10.6*  CREATININE 0.93 0.79 0.86 0.96  GFRNONAA  --  >60 >60 >60    LIVER FUNCTION TESTS: Recent Labs    08/25/23 1031 04/27/24 1615 05/27/24 1408 07/05/24 1330  BILITOT 0.9 0.7 0.8 0.9  AST 29 32 36 58*  ALT 20 16 20  34  ALKPHOS 98 121 120 104  PROT 6.6 7.1 8.2* 7.5  ALBUMIN 4.1 4.0 4.2 3.6    TUMOR MARKERS: No results for input(s): AFPTM, CEA, CA199, CHROMGRNA in the last 8760 hours.  Assessment and Plan: 83 y.o. male ex smoker with past medical history significant for arthritis, coronary artery disease with prior MI/CABG, DJD, gastroesophageal reflux, hypertension, hyperlipidemia, sleep apnea, right bundle blanch block, elevated PSA who presents now with diagnosed diffuse large B-cell lymphoma.  He is scheduled today for Port-A-Cath placement to assist with treatment.Risks and benefits of image guided port-a-catheter placement was discussed with the patient including, but not limited to bleeding, infection, pneumothorax, or fibrin sheath development and need for additional procedures.  All of the patient's  questions were answered, patient is agreeable to proceed. Consent signed and in chart.  Patient known to IR team from left supraclavicular lymph node biopsy on 06/23/24  Thank you for allowing our service to participate in ENES ROKOSZ 's care.  Electronically Signed: D. Franky Rakers, PA-C   07/06/2024, 2:20 PM      I spent a total of  20 minutes   in face to face in clinical consultation, greater than 50% of which was counseling/coordinating care for port a cath placement

## 2024-07-07 ENCOUNTER — Inpatient Hospital Stay (HOSPITAL_COMMUNITY): Admission: RE | Admit: 2024-07-07

## 2024-07-07 ENCOUNTER — Other Ambulatory Visit: Payer: Self-pay

## 2024-07-07 ENCOUNTER — Encounter (HOSPITAL_COMMUNITY): Payer: Self-pay

## 2024-07-07 ENCOUNTER — Ambulatory Visit (HOSPITAL_COMMUNITY)
Admission: RE | Admit: 2024-07-07 | Discharge: 2024-07-07 | Disposition: A | Discharge status: Home / Self Care | Source: Ambulatory Visit | Attending: Physician Assistant | Admitting: Physician Assistant

## 2024-07-07 DIAGNOSIS — Z87891 Personal history of nicotine dependence: Secondary | ICD-10-CM | POA: Insufficient documentation

## 2024-07-07 DIAGNOSIS — C8330 Diffuse large B-cell lymphoma, unspecified site: Secondary | ICD-10-CM | POA: Diagnosis present

## 2024-07-07 HISTORY — PX: IR IMAGING GUIDED PORT INSERTION: IMG5740

## 2024-07-07 MED ORDER — MIDAZOLAM HCL (PF) 2 MG/2ML IJ SOLN
INTRAMUSCULAR | Status: AC | PRN
  Administered 2024-07-07 (×2): 1 mg via INTRAVENOUS

## 2024-07-07 MED ORDER — HEPARIN SOD (PORK) LOCK FLUSH 100 UNIT/ML IV SOLN
500.0000 [IU] | Freq: Once | INTRAVENOUS | Status: AC
  Administered 2024-07-07: 10:00:00 500 [IU] via INTRAVENOUS

## 2024-07-07 MED ORDER — LIDOCAINE HCL 1 % IJ SOLN
INTRAMUSCULAR | Status: AC
  Filled 2024-07-07: qty 20

## 2024-07-07 MED ORDER — FENTANYL CITRATE (PF) 100 MCG/2ML IJ SOLN
INTRAMUSCULAR | Status: AC
  Filled 2024-07-07: qty 2

## 2024-07-07 MED ORDER — FENTANYL CITRATE (PF) 100 MCG/2ML IJ SOLN
INTRAMUSCULAR | Status: AC | PRN
  Administered 2024-07-07 (×2): 50 ug via INTRAVENOUS

## 2024-07-07 MED ORDER — HEPARIN SOD (PORK) LOCK FLUSH 100 UNIT/ML IV SOLN
INTRAVENOUS | Status: AC
  Filled 2024-07-07: qty 5

## 2024-07-07 MED ORDER — SODIUM CHLORIDE 0.9 % IV SOLN: INTRAVENOUS | Status: DC

## 2024-07-07 MED ORDER — LIDOCAINE HCL 1 % IJ SOLN
20.0000 mL | Freq: Once | INTRAMUSCULAR | Status: AC
  Administered 2024-07-07: 10:00:00 20 mL via INTRADERMAL

## 2024-07-07 MED FILL — Midazolam HCl Inj 2 MG/2ML (Base Equivalent): INTRAMUSCULAR | Qty: 2 | Status: AC

## 2024-07-07 NOTE — Procedures (Signed)
 Interventional Radiology Procedure Note  Procedure: Single Lumen Power Port Placement    Access:  Right IJ vein.  Findings: Catheter tip positioned at SVC/RA junction. Port is ready for immediate use.   Complications: None  EBL: < 10 mL  Recommendations:  - Ok to shower in 24 hours - Do not submerge for 7 days - Routine line care   Maxi Carreras T. Fredia Sorrow, M.D Pager:  [phone]

## 2024-07-07 NOTE — Discharge Instructions (Signed)

## 2024-07-08 ENCOUNTER — Inpatient Hospital Stay

## 2024-07-08 ENCOUNTER — Telehealth: Payer: Self-pay | Admitting: *Deleted

## 2024-07-08 ENCOUNTER — Inpatient Hospital Stay: Admitting: Hematology and Oncology

## 2024-07-08 ENCOUNTER — Inpatient Hospital Stay: Admitting: Nutrition

## 2024-07-08 VITALS — BP 112/74 | HR 93 | Temp 98.4°F | Resp 16 | Wt 174.0 lb

## 2024-07-08 DIAGNOSIS — C8330 Diffuse large B-cell lymphoma, unspecified site: Secondary | ICD-10-CM

## 2024-07-08 DIAGNOSIS — R948 Abnormal results of function studies of other organs and systems: Secondary | ICD-10-CM | POA: Diagnosis not present

## 2024-07-08 DIAGNOSIS — Z5111 Encounter for antineoplastic chemotherapy: Secondary | ICD-10-CM | POA: Diagnosis not present

## 2024-07-08 MED ORDER — SODIUM CHLORIDE 0.9 % IV SOLN
400.0000 mg/m2 | Freq: Once | INTRAVENOUS | Status: AC
  Administered 2024-07-08: 780 mg via INTRAVENOUS
  Filled 2024-07-08: qty 39

## 2024-07-08 MED ORDER — VINCRISTINE SULFATE CHEMO INJECTION 1 MG/ML
1.0000 mg | Freq: Once | INTRAVENOUS | Status: AC
  Administered 2024-07-08: 1 mg via INTRAVENOUS
  Filled 2024-07-08: qty 1

## 2024-07-08 MED ORDER — APREPITANT 130 MG/18ML IV EMUL
130.0000 mg | Freq: Once | INTRAVENOUS | Status: AC
  Administered 2024-07-08: 130 mg via INTRAVENOUS
  Filled 2024-07-08: qty 18

## 2024-07-08 MED ORDER — PALONOSETRON HCL INJECTION 0.25 MG/5ML
0.2500 mg | Freq: Once | INTRAVENOUS | Status: AC
  Administered 2024-07-08: 0.25 mg via INTRAVENOUS
  Filled 2024-07-08: qty 5

## 2024-07-08 MED ORDER — DIPHENHYDRAMINE HCL 25 MG PO CAPS
50.0000 mg | ORAL_CAPSULE | Freq: Once | ORAL | Status: AC
  Administered 2024-07-08: 50 mg via ORAL
  Filled 2024-07-08: qty 2

## 2024-07-08 MED ORDER — DOXORUBICIN HCL CHEMO IV INJECTION 2 MG/ML
25.0000 mg/m2 | Freq: Once | INTRAVENOUS | Status: AC
  Administered 2024-07-08: 50 mg via INTRAVENOUS
  Filled 2024-07-08: qty 25

## 2024-07-08 MED ORDER — SODIUM CHLORIDE 0.9 % IV SOLN
375.0000 mg/m2 | Freq: Once | INTRAVENOUS | Status: AC
  Administered 2024-07-08: 700 mg via INTRAVENOUS
  Filled 2024-07-08: qty 50

## 2024-07-08 MED ORDER — DEXAMETHASONE SOD PHOSPHATE PF 10 MG/ML IJ SOLN
10.0000 mg | Freq: Once | INTRAMUSCULAR | Status: AC
  Administered 2024-07-08: 10 mg via INTRAVENOUS

## 2024-07-08 MED ORDER — ACETAMINOPHEN 325 MG PO TABS
650.0000 mg | ORAL_TABLET | Freq: Once | ORAL | Status: AC
  Administered 2024-07-08: 650 mg via ORAL
  Filled 2024-07-08: qty 2

## 2024-07-08 MED ORDER — SODIUM CHLORIDE 0.9 % IV SOLN: INTRAVENOUS | Status: DC

## 2024-07-08 NOTE — Progress Notes (Unsigned)
 " [facility] [phone]   [phone]  PROGRESS NOTE  Patient Care Team: Amon Aloysius BRAVO, MD as PCP - General (Internal Medicine) Delford Maude BROCKS, MD as PCP - Cardiology (Cardiology) Timmy Maude SAUNDERS, MD as Consulting Physician (Oncology) Chauncey Redell Agent, MD as Consulting Physician (Urology)  Hematological/Oncological History # Diffuse Large B Cell Lymphoma, Stage III.  05/27/2024: establish care with Dr. Federico  06/02/2024: PET CT scan shows widespread metastatic disease to the nodal stations the abdomen, chest, and low neck. 06/23/2024: IR guided lymph node core biopsy confirms diffuse large B-cell lymphoma 07/08/2024: Cycle 1 Day 1 of R-miniCHOP  Interval History:  Benjamin Adkins 83 y.o. male with medical history significant for diffuse large B cell lymphoma who presents for a follow up visit. The patient's last visit was on 05/27/2024. In the interim since the last visit he completed chemotherapy education and presents today for C1D1 of R-miniCHOP  On exam today Benjamin Adkins reports he has been at his baseline level of health in the interim since our last visit.  He has had no other major changes.  He reports that he successfully had the port placed yesterday and is working well without difficulty.  He reports he had no issues with fevers, chills, sweats, runny nose, sore throat, cough.  He reports his appetite is marginal but his energy levels are good.  He notes that he has all of his supportive medications.  Overall he feels well today and is willing and able to proceed with treatment.  The bulk of our discussion focused on assuring he had everything necessary to start therapy today.  Additionally was noted during his port placement that he had atrial fibrillation.  An EKG was performed which confirmed the diagnosis.  We will reach out to his cardiologist but we will proceed with treatment as planned.  MEDICAL HISTORY:  Past Medical History:  Diagnosis  Date   Arthritis    Coronary atherosclerosis of unspecified type of vessel, native or graft    MI 2006, h/o CABG   DJD (degenerative joint disease)    Elevated prostate specific antigen (PSA)    Elevated PSA    Esophageal reflux    GERD (gastroesophageal reflux disease)    HTN (hypertension)    Hyperlipidemia    nmr lipoprofile 2004: LDL 154 (1653/711), HDL 42, TG 80.NMR  LDL =,125, but PMH  of MI LDL goal=,70   Hypertension    Myocardial infarction [facility])    2006   Other cataract    Personal history of colonic polyps    Shortness of breath    DOE   Sleep apnea    uses CPAP    SURGICAL HISTORY: Past Surgical History:  Procedure Laterality Date   CARDIAC CATHETERIZATION     2006   CATARACT EXTRACTION Bilateral    COLONOSCOPY W/ POLYPECTOMY     CORONARY ARTERY BYPASS GRAFT  2006   X 4    EYE SURGERY     HERNIA REPAIR     VENTRAL, LEFT & RIGHT GROIN   IR IMAGING GUIDED PORT INSERTION  07/07/2024   IR US  LIVER BIOPSY  06/23/2024   TOTAL KNEE ARTHROPLASTY  2006   TOTAL KNEE ARTHROPLASTY  08/25/2011   Procedure: TOTAL KNEE ARTHROPLASTY;  Surgeon: Lamar DELENA Millman, MD;  Location: [facility] OR;  Service: Orthopedics;  Laterality: Right;  DR MILLMAN CHRISTIAN 90 MINUTES FOR SURGERY    SOCIAL HISTORY: Social History   Socioeconomic History  Marital status: Widowed    Spouse name: Not on file   Number of children: 1   Years of education: Not on file   Highest education level: Master's degree (e.g., MA, MS, MEng, MEd, MSW, MBA)  Occupational History   Occupation: retired  Tobacco Use   Smoking status: Former    Current packs/day: 0.00    Average packs/day: 0.3 packs/day for 3.0 years (0.8 ttl pk-yrs)    Types: Cigarettes    Start date: 04/12/1959    Quit date: 08/12/1961    Years since quitting: 62.9   Smokeless tobacco: Never   Tobacco comments:    during college  Vaping Use   Vaping status: Never Used  Substance and Sexual Activity   Alcohol use: Yes    Alcohol/week: 1.0  standard drink of alcohol    Types: 1 Glasses of wine per week    Comment: with dinner occ   Drug use: No   Sexual activity: Yes  Other Topics Concern   Not on file  Social History Narrative   Lives alone.    Has a daughter in [place] , [place] G-son   Daughter: [name] [phone]   Social Drivers of Health   Tobacco Use: Medium Risk (06/23/2024)   Patient History    Smoking Tobacco Use: Former    Smokeless Tobacco Use: Never    Passive Exposure: Not on Actuary Strain: Low Risk (05/13/2024)   Overall Financial Resource Strain (CARDIA)    Difficulty of Paying Living Expenses: Not hard at all  Food Insecurity: No Food Insecurity (05/26/2024)   Epic    Worried About Programme Researcher, Broadcasting/film/video in the Last Year: Never true    Ran Out of Food in the Last Year: Never true  Transportation Needs: No Transportation Needs (05/26/2024)   Epic    Lack of Transportation (Medical): No    Lack of Transportation (Non-Medical): No  Physical Activity: Sufficiently Active (05/13/2024)   Exercise Vital Sign    Days of Exercise per Week: 4 days    Minutes of Exercise per Session: 40 min  Stress: No Stress Concern Present (05/13/2024)   [employer] of Occupational Health - Occupational Stress Questionnaire    Feeling of Stress: Only a little  Social Connections: Moderately Integrated (05/13/2024)   Social Connection and Isolation Panel    Frequency of Communication with Friends and Family: Three times a week    Frequency of Social Gatherings with Friends and Family: Three times a week    Attends Religious Services: More than 4 times per year    Active Member of Clubs or Organizations: Yes    Attends Banker Meetings: More than 4 times per year    Marital Status: Widowed  Intimate Partner Violence: Not At Risk (03/07/2024)   Epic    Fear of Current or Ex-Partner: No    Emotionally Abused: No    Physically Abused: No    Sexually Abused: No  Depression (PHQ2-9): Low  Risk (06/29/2024)   Depression (PHQ2-9)    PHQ-2 Score: 0  Alcohol Screen: Low Risk (05/13/2024)   Alcohol Screen    Last Alcohol Screening Score (AUDIT): 2  Housing: Unknown (05/26/2024)   Epic    Unable to Pay for Housing in the Last Year: No    Number of Times Moved in the Last Year: Not on file    Homeless in the Last Year: No  Utilities: Not At Risk (05/26/2024)   Epic  Threatened with loss of utilities: No  Health Literacy: Adequate Health Literacy (03/07/2024)   B1300 Health Literacy    Frequency of need for help with medical instructions: Never    FAMILY HISTORY: Family History  Problem Relation Age of Onset   Hypertension Mother    Breast cancer Mother    Coronary artery disease Mother    Heart disease Mother    Arthritis Mother    Cancer Mother    Stomach cancer Father    Breast cancer Sister    Crohn's disease Sister    Arthritis Sister    Asthma Sister        childhood   Arthritis Brother    Heart disease Brother    Heart attack Paternal Grandfather 45   Colon cancer Neg Hx    Prostate cancer Neg Hx     ALLERGIES:  is allergic to short ragweed pollen ext.  MEDICATIONS:  Current Outpatient Medications  Medication Sig Dispense Refill   allopurinol  (ZYLOPRIM ) 300 MG tablet Take 1 tablet (300 mg total) by mouth daily. 90 tablet 1   amoxicillin  (AMOXIL ) 500 MG capsule TAKE 4 CAPSULES BY MOUTH AS NEEDED FOR DENTAL PROCEDURE 20 capsule 0   aspirin  325 MG EC tablet Take 325 mg by mouth daily.     atorvastatin  (LIPITOR) 40 MG tablet Take 1 tablet (40 mg total) by mouth daily. 100 tablet 0   azelastine  (ASTELIN ) 0.1 % nasal spray Place 2 sprays into both nostrils 2 (two) times daily. Use in each nostril as directed 30 mL 12   benzonatate  (TESSALON ) 200 MG capsule Take 1 capsule (200 mg total) by mouth 3 (three) times daily as needed. 45 capsule 1   celecoxib  (CELEBREX ) 100 MG capsule Take 1 capsule (100 mg total) by mouth 2 (two) times daily as needed for moderate  pain. 180 capsule 1   cetirizine (ZYRTEC) 10 MG tablet Take 10 mg by mouth daily.     ezetimibe  (ZETIA ) 10 MG tablet Take 1 tablet (10 mg total) by mouth daily. 90 tablet 1   fluticasone  (FLONASE ) 50 MCG/ACT nasal spray Place 2 sprays into both nostrils daily. (Patient not taking: Reported on 06/29/2024) 16 g 6   HYDROcodone -acetaminophen  (NORCO/VICODIN) 5-325 MG tablet Take 0.5-1 tablets by mouth every 4 (four) hours as needed. (Patient not taking: Reported on 06/29/2024) 12 tablet 0   hydrocortisone  (ANUSOL -HC) 25 MG suppository Place 1 suppository (25 mg total) rectally 2 (two) times daily as needed for hemorrhoids. 12 suppository 1   lidocaine -prilocaine  (EMLA ) cream Apply 1 Application topically as needed. 30 g 0   metoprolol  succinate (TOPROL -XL) 25 MG 24 hr tablet Take 1 tablet (25 mg total) by mouth daily. KEEP JANUARY OFFICE VISIT. 90 tablet 0   Multiple Vitamin (MULTIVITAMIN) capsule Take 1 capsule by mouth daily.     nitroGLYCERIN  (NITROSTAT ) 0.4 MG SL tablet Place 1 tablet (0.4 mg total) under the tongue every 5 (five) minutes as needed. 25 tablet 3   omeprazole  (PRILOSEC) 40 MG capsule Take 1 capsule (40 mg total) by mouth daily. 30 capsule 3   ondansetron  (ZOFRAN ) 8 MG tablet Take 1 tablet (8 mg total) by mouth every 8 (eight) hours as needed. 30 tablet 0   predniSONE  (DELTASONE ) 20 MG tablet Take 3 tablets (60 mg total) by mouth daily with breakfast. 15 tablet 5   prochlorperazine  (COMPAZINE ) 10 MG tablet TAKE 1 TABLET BY MOUTH EVERY 6 HOURS AS NEEDED FOR NAUSEA OR VOMITING. 30 tablet 0   triamcinolone  ointment (KENALOG) 0.1 % Apply topically.     No current facility-administered medications for this visit.    REVIEW OF SYSTEMS:   Constitutional: ( - ) fevers, ( - )  chills , ( - ) night sweats Eyes: ( - ) blurriness of vision, ( - ) double vision, ( - ) watery eyes Ears, nose, mouth, throat, and face: ( - ) mucositis, ( - ) sore throat Respiratory: ( - ) cough, ( - ) dyspnea, (  - ) wheezes Cardiovascular: ( - ) palpitation, ( - ) chest discomfort, ( - ) lower extremity swelling Gastrointestinal:  ( - ) nausea, ( - ) heartburn, ( - ) change in bowel habits Skin: ( - ) abnormal skin rashes Lymphatics: ( - ) new lymphadenopathy, ( - ) easy bruising Neurological: ( - ) numbness, ( - ) tingling, ( - ) new weaknesses Behavioral/Psych: ( - ) mood change, ( - ) new changes  All other systems were reviewed with the patient and are negative.  PHYSICAL EXAMINATION: ECOG PERFORMANCE STATUS: 1 - Symptomatic but completely ambulatory  There were no vitals filed for this visit.  There were no vitals filed for this visit.   GENERAL: Well-appearing elderly Caucasian male, alert, no distress and comfortable SKIN: skin color, texture, turgor are normal, no rashes or significant lesions EYES: conjunctiva are pink and non-injected, sclera clear LUNGS: clear to auscultation and percussion with normal breathing effort HEART: regular rate & rhythm and no murmurs and no lower extremity edema Musculoskeletal: no cyanosis of digits and no clubbing  PSYCH: alert & oriented x 3, fluent speech NEURO: no focal motor/sensory deficits  LABORATORY DATA:  I have reviewed the data as listed    Latest Ref Rng & Units 07/05/2024    1:30 PM 06/23/2024   11:05 AM 05/27/2024    2:08 PM  CBC  WBC 4.0 - 10.5 K/uL 15.0  12.7  12.1   Hemoglobin 13.0 - 17.0 g/dL 86.9  87.9  86.6   Hematocrit 39.0 - 52.0 % 38.4  36.9  40.1   Platelets 150 - 400 K/uL 355  227  242        Latest Ref Rng & Units 07/05/2024    1:30 PM 05/27/2024    2:08 PM 04/27/2024    4:15 PM  CMP  Glucose 70 - 99 mg/dL 893  886  864   BUN 8 - 23 mg/dL 14  12  10    Creatinine 0.61 - 1.24 mg/dL 9.03  9.13  9.20   Sodium 135 - 145 mmol/L 133  137  131   Potassium 3.5 - 5.1 mmol/L 3.4  4.1  3.8   Chloride 98 - 111 mmol/L 94  99  96   CO2 22 - 32 mmol/L 26  30  22    Calcium  8.9 - 10.3 mg/dL 89.3  89.6  9.6   Total Protein 6.5  - 8.1 g/dL 7.5  8.2  7.1   Total Bilirubin 0.0 - 1.2 mg/dL 0.9  0.8  0.7   Alkaline Phos 38 - 126 U/L 104  120  121   AST 15 - 41 U/L 58  36  32   ALT 0 - 44 U/L 34  20  16     No results found for: MPROTEIN No results found for: KPAFRELGTCHN, LAMBDASER, KAPLAMBRATIO  RADIOGRAPHIC STUDIES: IR IMAGING GUIDED PORT INSERTION Result Date: 07/07/2024 CLINICAL DATA:  Diffuse large B-cell lymphoma and need for porta cath to begin chemotherapy.  EXAM: IMPLANTED PORT A CATH PLACEMENT WITH ULTRASOUND AND FLUOROSCOPIC GUIDANCE ANESTHESIA/SEDATION: Moderate (conscious) sedation was employed during this procedure. A total of Versed  2.0 mg and Fentanyl  100 mcg was administered intravenously. Moderate Sedation Time: 36 minutes. The patient's level of consciousness and vital signs were monitored continuously by radiology nursing throughout the procedure under my direct supervision. FLUOROSCOPY: Radiation Exposure Index: 2.0 mGy Kerma PROCEDURE: The procedure, risks, benefits, and alternatives were explained to the patient. Questions regarding the procedure were encouraged and answered. The patient understands and consents to the procedure. A time-out was performed prior to initiating the procedure. Ultrasound was utilized to confirm patency of the right internal jugular vein. An ultrasound image was saved and recorded. The right neck and chest were prepped with chlorhexidine  in a sterile fashion, and a sterile drape was applied covering the operative field. Maximum barrier sterile technique with sterile gowns and gloves were used for the procedure. Local anesthesia was provided with 1% lidocaine . After creating a small venotomy incision, a 21 gauge needle was advanced into the right internal jugular vein under direct, real-time ultrasound guidance. Ultrasound image documentation was performed. After securing guidewire access, an 8 Fr dilator was placed. A J-wire was kinked to measure appropriate catheter length.  A subcutaneous port pocket was then created along the upper chest wall utilizing sharp and blunt dissection. Portable cautery was utilized. The pocket was irrigated with sterile saline. A single lumen power injectable port was chosen for placement. The 8 Fr catheter was tunneled from the port pocket site to the venotomy incision. The port was placed in the pocket. External catheter was trimmed to appropriate length based on guidewire measurement. At the venotomy, an 8 Fr peel-away sheath was placed over a guidewire. The catheter was then placed through the sheath and the sheath removed. Final catheter positioning was confirmed and documented with a fluoroscopic spot image. The port was accessed with a needle and aspirated and flushed with heparinized saline. The access needle was removed. The venotomy and port pocket incisions were closed with subcutaneous 3-0 Monocryl and subcuticular 4-0 Vicryl. Dermabond was applied to both incisions. COMPLICATIONS: COMPLICATIONS None FINDINGS: After catheter placement, the tip lies at the cavo-atrial junction. The catheter aspirates normally and is ready for immediate use. IMPRESSION: Placement of single lumen port a cath via right internal jugular vein. The catheter tip lies at the cavo-atrial junction. A power injectable port a cath was placed and is ready for immediate use. Electronically Signed   By: Marcey Moan M.D.   On: 07/07/2024 11:48   ECHOCARDIOGRAM COMPLETE Result Date: 06/28/2024    ECHOCARDIOGRAM REPORT   Patient Name:   SAUNDERS ARLINGTON Date of Exam: 06/28/2024 Medical Rec #:  [id number]     Height:       69.0 in Accession #:    [id number]    Weight:       166.0 lb Date of Birth:  1941-01-02    BSA:          1.909 m Patient Age:    83 years      BP:           130/70 mmHg Patient Gender: M             HR:           101 bpm. Exam Location:  Outpatient Procedure: 3D Echo, 2D Echo, Cardiac Doppler, Color Doppler and Intracardiac            Opacification Agent (Both  Spectral and Color Flow Doppler were            utilized during procedure). Indications:    Chemo  History:        Patient has no prior history of Echocardiogram examinations.                 Previous Myocardial Infarction, Arrythmias:RBBB; Risk                 Factors:Hypertension and Former Smoker.  Sonographer:    Orvil Holmes RDCS Referring Phys: [id number] KAITLYN E WALISIEWICZ IMPRESSIONS  1. Left ventricular ejection fraction, by estimation, is 55 to 60%. Left ventricular ejection fraction by 3D volume is 56 %. The left ventricle has normal function. The left ventricle has no regional wall motion abnormalities. There is moderate left ventricular hypertrophy of the anterior segment. Left ventricular diastolic parameters are indeterminate.  2. Right ventricular systolic function is normal. The right ventricular size is mildly enlarged.  3. Left atrial size was severely dilated.  4. The mitral valve is normal in structure. Moderate mitral valve regurgitation. No evidence of mitral stenosis.  5. The aortic valve is tricuspid. Aortic valve regurgitation is mild. No aortic stenosis is present.  6. The inferior vena cava is normal in size with greater than 50% respiratory variability, suggesting right atrial pressure of 3 mmHg. FINDINGS  Left Ventricle: Left ventricular ejection fraction, by estimation, is 55 to 60%. Left ventricular ejection fraction by 3D volume is 56 %. The left ventricle has normal function. The left ventricle has no regional wall motion abnormalities. Definity  contrast agent was given IV to delineate the left ventricular endocardial borders. The left ventricular internal cavity size was normal in size. There is moderate left ventricular hypertrophy of the anterior segment. Left ventricular diastolic parameters  are indeterminate. Right Ventricle: The right ventricular size is mildly enlarged. No increase in right ventricular wall thickness. Right ventricular systolic function is normal. Left  Atrium: Left atrial size was severely dilated. Right Atrium: Right atrial size was normal in size. Pericardium: There is no evidence of pericardial effusion. Mitral Valve: The mitral valve is normal in structure. Mild mitral annular calcification. Moderate mitral valve regurgitation. No evidence of mitral valve stenosis. Tricuspid Valve: The tricuspid valve is normal in structure. Tricuspid valve regurgitation is not demonstrated. No evidence of tricuspid stenosis. Aortic Valve: The aortic valve is tricuspid. Aortic valve regurgitation is mild. No aortic stenosis is present. Pulmonic Valve: The pulmonic valve was normal in structure. Pulmonic valve regurgitation is not visualized. No evidence of pulmonic stenosis. Aorta: The aortic root is normal in size and structure. Venous: The inferior vena cava is normal in size with greater than 50% respiratory variability, suggesting right atrial pressure of 3 mmHg. IAS/Shunts: No atrial level shunt detected by color flow Doppler. Additional Comments: 3D was performed not requiring image post processing on an independent workstation and was normal.  LEFT VENTRICLE PLAX 2D LVIDd:         4.25 cm         Diastology LVIDs:         2.87 cm         LV e' medial:    6.85 cm/s LV PW:         1.12 cm         LV E/e' medial:  15.6 LV IVS:        1.58 cm         LV e' lateral:   13.70  cm/s LVOT diam:     2.00 cm         LV E/e' lateral: 7.8 LV SV:         48 LV SV Index:   25 LVOT Area:     3.14 cm        3D Volume EF                                LV 3D EF:    Left                                             ventricul                                             ar                                             ejection                                             fraction                                             by 3D                                             volume is                                             56 %.                                 3D Volume EF:                                 3D EF:        56 %                                LV EDV:       124 ml                                LV ESV:       55 ml  LV SV:        69 ml RIGHT VENTRICLE RV Basal diam:  4.24 cm RV Mid diam:    2.95 cm RV S prime:     9.32 cm/s TAPSE (M-mode): 1.6 cm LEFT ATRIUM              Index        RIGHT ATRIUM           Index LA diam:        5.50 cm  2.88 cm/m   RA Area:     16.70 cm LA Vol (A2C):   117.0 ml 61.30 ml/m  RA Volume:   44.30 ml  23.21 ml/m LA Vol (A4C):   91.4 ml  47.89 ml/m LA Biplane Vol: 105.0 ml 55.01 ml/m  AORTIC VALVE LVOT Vmax:         97.50 cm/s LVOT Vmean:        63.900 cm/s LVOT VTI:          0.152 m AR Vena Contracta: 0.36 cm  AORTA Ao Root diam: 3.60 cm Ao Asc diam:  3.90 cm MITRAL VALVE MV Area (PHT): 5.27 cm       SHUNTS MV Decel Time: 144 msec       Systemic VTI:  0.15 m MR Peak grad:    120.1 mmHg   Systemic Diam: 2.00 cm MR Mean grad:    75.0 mmHg MR Vmax:         548.00 cm/s MR Vmean:        404.0 cm/s MR PISA:         3.08 cm MR PISA Eff ROA: 17 mm MR PISA Radius:  0.70 cm MV E velocity: 107.00 cm/s MV A velocity: 36.70 cm/s MV E/A ratio:  2.92 Oneil Parchment MD Electronically signed by Oneil Parchment MD Signature Date/Time: 06/28/2024/1:37:10 PM    Final    IR LYMPH NODE CORE BIOPSY Result Date: 06/23/2024 INDICATION: Metastatic disease and needs a tissue diagnosis. EXAM: Ultrasound-guided core biopsy of left supraclavicular lymph node/nodal mass MEDICATIONS: Moderate sedation ANESTHESIA/SEDATION: Moderate (conscious) sedation was employed during this procedure. A total of Versed  2 mg and Fentanyl  50 mcg was administered intravenously by the radiology nurse. Total intra-service moderate Sedation Time: 15 minutes. The patient's level of consciousness and vital signs were monitored continuously by radiology nursing throughout the procedure under my direct supervision. FLUOROSCOPY TIME:  None COMPLICATIONS: None immediate. PROCEDURE: Informed  written consent was obtained from the patient after a thorough discussion of the procedural risks, benefits and alternatives. All questions were addressed. Maximal Sterile Barrier Technique was utilized including caps, mask, sterile gowns, sterile gloves, sterile drape, hand hygiene and skin antiseptic. A timeout was performed prior to the initiation of the procedure. Left supraclavicular nodal mass was identified with ultrasound. Left side of the neck was prepped and draped in sterile fashion. Skin was anesthetized using 1% lidocaine . Small incision was made. Using ultrasound guidance, 17 gauge coaxial needle was directed into the nodal mass. Multiple core biopsies were obtained with an 18 gauge device. Specimens placed in saline. 17 gauge needles removed. Bandage placed over the puncture site. Ultrasound images were taken and saved for this procedure. FINDINGS: Large hypoechoic nodal mass in the left supraclavicular region. Biopsy needle confirmed within the nodal mass. Adequate core specimens obtained. IMPRESSION: Ultrasound-guided core biopsy of the left supraclavicular nodal mass. Electronically Signed   By: Juliene Balder M.D.   On: 06/23/2024 14:02    ASSESSMENT & PLAN  Benjamin Adkins is an 83 year old male with a diagnosis of Diffuse Large B Cell Lymphoma who presents for a follow up visit.   # Diffuse Large B Cell Lymphoma, Stage III --findings are consistent with a DLBCL, further testing pending for possible double hit lymphoma -- plan to proceed with R-miniCHOP chemotherapy given patients age and health status -- Supportive medications as noted below. PLAN:  -- today is Cycle 1 Day 1 of R-miniCHOP --labs today show WBC 15.0, Hgb 13.0, MCV 79.5, Plt 355.  --Will plan for interval PET CT scan after 3 cycles of treatment, before cycle 4.  Additionally will plan for a posttreatment PET CT scan -- Will plan to see patient on cycle 2 day 1 of treatment  #Supportive Care -- chemotherapy education  completed -- port placed -- zofran  8mg  q8H PRN and compazine  10mg  PO q6H for nausea -- allopurinol  300mg  PO daily for TLS prophylaxis -- EMLA  cream for port -- no pain medication required at this time.    No orders of the defined types were placed in this encounter.   All questions were answered. The patient knows to call the clinic with any problems, questions or concerns.  A total of more than 30 minutes were spent on this encounter with face-to-face time and non-face-to-face time, including preparing to see the patient, ordering tests and/or medications, counseling the patient and coordination of care as outlined above.   Norleen IVAR Kidney, MD Department of Hematology/Oncology [facility] [facility] Phone: [phone] Pager: [phone] Email: [email or web address]  07/10/2024 2:53 PM  "

## 2024-07-08 NOTE — Telephone Encounter (Signed)
 TCT patient. No answer LVM for pt to return this call before 4:30 pm today. Received call back from pt. Advised that his EKG from his time in the infusion area revealed Afib and that Dr. Federico will notify pt's cardiologist Dr. Delford. Pt states he has an appt with him in 1 week. 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 Pt very pleased with these results.  He states he feels pretty good after his 1st treatment today of R-mini CHOP. Abit fatigued but other wise fairly well. He was reminded to call with any questions or concerns. He voiced understanding.

## 2024-07-08 NOTE — Progress Notes (Signed)
 [facility] Clinical Social Work  Initial Assessment   Benjamin Adkins is a 83 y.o. year old male accompanied by patient and daughter. Clinical Social Work was referred by self for need for community resources.   SDOH (Social Determinants of Health) assessments performed: Yes SDOH Interventions    Flowsheet Row Clinical Support from 03/07/2024 in [facility] at [facility] Clinical Support from 03/03/2023 in [facility] at [facility]  SDOH Interventions    Food Insecurity Interventions Intervention Not Indicated Intervention Not Indicated  Housing Interventions Intervention Not Indicated Intervention Not Indicated  Transportation Interventions Intervention Not Indicated Intervention Not Indicated  Utilities Interventions Intervention Not Indicated Intervention Not Indicated  Alcohol Usage Interventions Intervention Not Indicated (Score <7) Intervention Not Indicated (Score <7)  Financial Strain Interventions Intervention Not Indicated Intervention Not Indicated  Physical Activity Interventions Intervention Not Indicated Intervention Not Indicated  Stress Interventions Intervention Not Indicated Intervention Not Indicated  Social Connections Interventions Intervention Not Indicated Intervention Not Indicated  Health Literacy Interventions Intervention Not Indicated Intervention Not Indicated    SDOH Screenings   Food Insecurity: No Food Insecurity (05/26/2024)  Housing: Unknown (05/26/2024)  Transportation Needs: No Transportation Needs (05/26/2024)  Utilities: Not At Risk (05/26/2024)  Alcohol Screen: Low Risk (05/13/2024)  Depression (PHQ2-9): Low Risk (06/29/2024)  Financial Resource Strain: Low Risk (05/13/2024)  Physical Activity: Sufficiently Active (05/13/2024)  Social Connections: Moderately Integrated (05/13/2024)  Stress: No Stress Concern Present (05/13/2024)  Tobacco Use: Medium Risk (06/23/2024)  Health Literacy: Adequate  Health Literacy (03/07/2024)    PHQ 2/9:    06/29/2024   12:04 PM 03/16/2024    3:16 PM 03/07/2024    9:05 AM  Depression screen PHQ 2/9  Decreased Interest 0 0 0  Down, Depressed, Hopeless 0 0 0  PHQ - 2 Score 0 0 0     Distress Screen completed: Yes    07/05/2024    1:21 PM  ONCBCN DISTRESS SCREENING  Screening Type Initial Screening  How much distress have you been experiencing in the past week? (0-10) 5  Practical concerns type Taking care of myself;Housing/Utilities  Emotional concerns type Changes in appearance  Physical Concerns Type  Pain;Changes in eating      Family/Social Information:  Housing Arrangement: patient lives alone Family members/support persons in your life? Patient's primary support is his daughter.  Transportation concerns: no  Employment: Retired .  Income source: Actor concerns: No Type of concern: None Food access concerns: no Religious or spiritual practice: Yes-Christian. Patient is very involved in his church. Advanced directives: Yes-Completed and uploaded to chart. Patient's daughter is Chartered Certified Accountant Currently in place:  Transportation, Family Support, Insurance  Coping/ Adjustment to diagnosis: Patient understands treatment plan and what happens next? yes Concerns about diagnosis and/or treatment: Patient and Daughter are both adjusting to diagnosis.  Patient reported stressors: None reported at time of visit.  Patient enjoys Involved at [facility] Current coping skills/ strengths: Ability for insight , Active sense of humor , Average or above average intelligence , Capable of independent living , Communication skills , Contractor , General fund of knowledge , Motivation for treatment/growth , Physical Health , Religious Affiliation , Special hobby/interest , and Supportive family/friends     SUMMARY: Current SDOH Barriers:  No SDOH barriers  Clinical Social Work Clinical Goal(s):  Patient will work  with SW to address concerns related to adjustment, home health  Interventions: Discussed common feeling and emotions when being  diagnosed with cancer, and the importance of support during treatment Informed patient of the support team roles and support services at [facility] Provided CSW contact information and encouraged patient to call with any questions or concerns Referred patient to community resources: [facility] / [facility]    Follow Up Plan: CSW will follow-up with patient by phone  Patient verbalizes understanding of plan: Yes   Lizbeth Sprague, LCSW Clinical Social Worker [facility]

## 2024-07-08 NOTE — Progress Notes (Signed)
 EKG completed. Atrial Fibrillation with HR ranging 95-110. Federico, MD, aware. Copy in chart.

## 2024-07-08 NOTE — Progress Notes (Signed)
 83 year old male diagnosed with B-cell lymphoma and followed by Dr. Federico.  Patient receiving cycle 1 day 1 of R-mini CHOP.  Past medical history includes MI, CABG, degenerative joint disease, reflux, GERD, hypertension, hyperlipidemia, sleep apnea.  Medications include multivitamin, Prilosec, Zofran , prednisone , Compazine .  Labs include sodium 133, potassium 3.4, glucose 106 on December 16.  Height: 5 feet 9 inches. Weight: 174 pounds December 19 Usual body weight: 200 pounds BMI: 25.70  Patient lives alone but has a supportive daughter. He has decreased appetite and progressive weight loss.  He has lost 13% of his usual body weight.  States he was not intentionally trying to lose weight but just went with it because he felt like he had extra weight to lose.  As weight loss progressed patient became more concerned.  He has started to take a fiber gummy which provides 5 g of fiber.  Reports increased gas/burping.  Reports he has a decreased appetite and feels he gets full sooner.  He is interested in the recipe for baking soda and salt water gargle.  He has started drinking 1 high-protein shake daily. Librado)  Nutrition diagnosis: Unintended weight loss related to lymphoma as evidenced by 13% loss of usual body weight.  Intervention: Education provided on consuming smaller more frequent meals and snacks with higher calorie, higher protein foods. Continue high-protein shake at least 2 times daily between meals. Education provided about baking soda and salt water gargle and FAQ sheet given. Encourage patient to have ready-to-eat meals and snacks in case he does not feel like cooking. Suggested holding fiber gummy to see if symptoms improve.  Encouraged gradually increasing vegetables and fruits and whole grains for added fiber. Provided name and telephone number for questions.  Monitoring, evaluation, goals: Patient will tolerate increased calories and protein to minimize further weight  loss.  Next visit: To be scheduled with upcoming treatment as needed.  Patient has RD contact information.  **Disclaimer: This note was dictated with voice recognition software. Similar sounding words can inadvertently be transcribed and this note may contain transcription errors which may not have been corrected upon publication of note.**

## 2024-07-08 NOTE — Patient Instructions (Signed)
 [facility] - A DEPT OF [facility]  Discharge Instructions: Thank you for choosing [facility] to provide your oncology and hematology care.   If you have a lab appointment with the Cancer Center, please go directly to the Cancer Center and check in at the registration area.   Wear comfortable clothing and clothing appropriate for easy access to any Portacath or PICC line.   We strive to give you quality time with your provider. You may need to reschedule your appointment if you arrive late (15 or more minutes).  Arriving late affects you and other patients whose appointments are after yours.  Also, if you miss three or more appointments without notifying the office, you may be dismissed from the clinic at the provider's discretion.      For prescription refill requests, have your pharmacy contact our office and allow 72 hours for refills to be completed.    Today you received the following chemotherapy and/or immunotherapy agents: Doxorubicin, Vincristine, Cytoxan, Rituximab.       To help prevent nausea and vomiting after your treatment, we encourage you to take your nausea medication as directed.  BELOW ARE SYMPTOMS THAT SHOULD BE REPORTED IMMEDIATELY: *FEVER GREATER THAN 100.4 F (38 C) OR HIGHER *CHILLS OR SWEATING *NAUSEA AND VOMITING THAT IS NOT CONTROLLED WITH YOUR NAUSEA MEDICATION *UNUSUAL SHORTNESS OF BREATH *UNUSUAL BRUISING OR BLEEDING *URINARY PROBLEMS (pain or burning when urinating, or frequent urination) *BOWEL PROBLEMS (unusual diarrhea, constipation, pain near the anus) TENDERNESS IN MOUTH AND THROAT WITH OR WITHOUT PRESENCE OF ULCERS (sore throat, sores in mouth, or a toothache) UNUSUAL RASH, SWELLING OR PAIN  UNUSUAL VAGINAL DISCHARGE OR ITCHING   Items with * indicate a potential emergency and should be followed up as soon as possible or go to the Emergency Department if any problems should occur.  Please show the  CHEMOTHERAPY ALERT CARD or IMMUNOTHERAPY ALERT CARD at check-in to the Emergency Department and triage nurse.  Should you have questions after your visit or need to cancel or reschedule your appointment, please contact [facility] - A DEPT OF [facility]  Dept: [phone]  and follow the prompts.  Office hours are 8:00 a.m. to 4:30 p.m. Monday - Friday. Please note that voicemails left after 4:00 p.m. may not be returned until the following business day.  We are closed weekends and major holidays. You have access to a nurse at all times for urgent questions. Please call the main number to the clinic Dept: [phone] and follow the prompts.   For any non-urgent questions, you may also contact your provider using MyChart. We now offer e-Visits for anyone 89 and older to request care online for non-urgent symptoms. For details visit [email or web address].   Also download the MyChart app! Go to the app store, search "MyChart", open the app, select [place], and log in with your MyChart username and password.  Doxorubicin Injection What is this medication? DOXORUBICIN (dox oh ROO bi sin) treats some types of cancer. It works by slowing down the growth of cancer cells. This medicine may be used for other purposes; ask your health care provider or pharmacist if you have questions. COMMON BRAND NAME(S): Adriamycin, Adriamycin PFS, Adriamycin RDF, Rubex What should I tell my care team before I take this medication? They need to know if you have any of these conditions: Heart disease History of low blood cell levels caused by  a medication Liver disease Recent or ongoing radiation An unusual or allergic reaction to doxorubicin, other medications, foods, dyes, or preservatives If you or your partner are pregnant or trying to get pregnant Breast-feeding How should I use this medication? This medication is injected into a vein. It is given by your care team in a  hospital or clinic setting. Talk to your care team about the use of this medication in children. Special care may be needed. Overdosage: If you think you have taken too much of this medicine contact a poison control center or emergency room at once. NOTE: This medicine is only for you. Do not share this medicine with others. What if I miss a dose? Keep appointments for follow-up doses. It is important not to miss your dose. Call your care team if you are unable to keep an appointment. What may interact with this medication? 6-mercaptopurine Paclitaxel Phenytoin St. John's wort Trastuzumab Verapamil This list may not describe all possible interactions. Give your health care provider a list of all the medicines, herbs, non-prescription drugs, or dietary supplements you use. Also tell them if you smoke, drink alcohol, or use illegal drugs. Some items may interact with your medicine. What should I watch for while using this medication? Your condition will be monitored carefully while you are receiving this medication. You may need blood work while taking this medication. This medication may make you feel generally unwell. This is not uncommon as chemotherapy can affect healthy cells as well as cancer cells. Report any side effects. Continue your course of treatment even though you feel ill unless your care team tells you to stop. There is a maximum amount of this medication you should receive throughout your life. The amount depends on the medical condition being treated and your overall health. Your care team will watch how much of this medication you receive. Tell your care team if you have taken this medication before. Your urine may turn red for a few days after your dose. This is not blood. If your urine is dark or brown, call your care team. In some cases, you may be given additional medications to help with side effects. Follow all directions for their use. This medication may increase your  risk of getting an infection. Call your care team for advice if you get a fever, chills, sore throat, or other symptoms of a cold or flu. Do not treat yourself. Try to avoid being around people who are sick. This medication may increase your risk to bruise or bleed. Call your care team if you notice any unusual bleeding. Talk to your care team about your risk of cancer. You may be more at risk for certain types of cancers if you take this medication. Talk to your care team if you or your partner may be pregnant. Serious birth defects can occur if you take this medication during pregnancy and for 6 months after the last dose. Contraception is recommended while taking this medication and for 6 months after the last dose. Your care team can help you find the option that works for you. If your partner can get pregnant, use a condom while taking this medication and for 6 months after the last dose. Do not breastfeed while taking this medication. This medication may cause infertility. Talk to your care team if you are concerned about your fertility. What side effects may I notice from receiving this medication? Side effects that you should report to your care team as soon as possible:  Allergic reactions--skin rash, itching, hives, swelling of the face, lips, tongue, or throat Heart failure--shortness of breath, swelling of the ankles, feet, or hands, sudden weight gain, unusual weakness or fatigue Heart rhythm changes--fast or irregular heartbeat, dizziness, feeling faint or lightheaded, chest pain, trouble breathing Infection--fever, chills, cough, sore throat, wounds that don't heal, pain or trouble when passing urine, general feeling of discomfort or being unwell Low red blood cell level--unusual weakness or fatigue, dizziness, headache, trouble breathing Painful swelling, warmth, or redness of the skin, blisters or sores at the infusion site Unusual bruising or bleeding Side effects that usually do not  require medical attention (report to your care team if they continue or are bothersome): Diarrhea Hair loss Nausea Pain, redness, or swelling with sores inside the mouth or throat Red urine This list may not describe all possible side effects. Call your doctor for medical advice about side effects. You may report side effects to FDA at 1-800-FDA-1088. Where should I keep my medication? This medication is given in a hospital or clinic. It will not be stored at home. NOTE: This sheet is a summary. It may not cover all possible information. If you have questions about this medicine, talk to your doctor, pharmacist, or health care provider.  2024 Elsevier/Gold Standard (2022-10-09 00:00:00)  Vincristine Injection What is this medication? VINCRISTINE (vin KRIS teen) treats some types of cancer. It works by slowing down the growth of cancer cells. This medicine may be used for other purposes; ask your health care provider or pharmacist if you have questions. COMMON BRAND NAME(S): Oncovin, Vincasar PFS What should I tell my care team before I take this medication? They need to know if you have any of these conditions: Infection Kidney disease Liver disease Low white blood cell levels Lung disease Nervous system disease, such as Charcot-Marie-Tooth (CMT) Recent or ongoing radiation therapy An unusual or allergic reaction to vincristine, other chemotherapy agents, other medications, foods, dyes, or preservatives Pregnant or trying to get pregnant Breast-feeding How should I use this medication? This medication is infused into a vein. It is given by your care team in a hospital or clinic setting. Talk to your care team about the use of this medication in children. While it may be given to children for selected conditions, precautions do apply. Overdosage: If you think you have taken too much of this medicine contact a poison control center or emergency room at once. NOTE: This medicine is  only for you. Do not share this medicine with others. What if I miss a dose? Keep appointments for follow-up doses. It is important not to miss your dose. Call your care team if you are unable to keep an appointment. What may interact with this medication? Do not take this medication with any of the following: Live virus vaccines This medication may also interact with the following: Medications for fungal infections, such as itraconazole or fluconazole Phenytoin Supplements, such as St. John's wort This list may not describe all possible interactions. Give your health care provider a list of all the medicines, herbs, non-prescription drugs, or dietary supplements you use. Also tell them if you smoke, drink alcohol, or use illegal drugs. Some items may interact with your medicine. What should I watch for while using this medication? Your condition will be monitored carefully while you are receiving this medication. This medication may make you feel generally unwell. This is not uncommon as chemotherapy can affect healthy cells as well as cancer cells. Report any side effects. Continue  your course of treatment even though you feel ill unless your care team tells you to stop. You may need blood work while taking this medication. This medication may increase your risk to bruise or bleed. Call your care team if you notice any unusual bleeding. This medication may increase your risk of getting an infection. Call your care team for advice if you get a fever, chills, sore throat, or other symptoms of a cold or flu. Do not treat yourself. Try to avoid being around people who are sick. This medication will cause constipation. If you do not have a bowel movement for 3 days, call your care team. Call your care team if you are around anyone with measles, chickenpox, or if you develop sores or blisters that do not heal properly. Be careful brushing or flossing your teeth or using a toothpick because you may get  an infection or bleed more easily. If you have any dental work done, tell your dentist you are receiving this medication Talk to your care team if you or your partner wish to become pregnant or think either of you might be pregnant. This medication can cause serious birth defects. This medication may cause infertility. Talk to your care team if you are concerned about your fertility. Talk to your care team before breastfeeding. Changes to your treatment plan may be needed. What side effects may I notice from receiving this medication? Side effects that you should report to your care team as soon as possible: Allergic reactions--skin rash, itching, hives, swelling of the face, lips, tongue, or throat High uric acid level--severe pain, redness, warmth, or swelling in joints, pain or trouble passing urine, pain in the lower back or sides Infection--fever, chills, cough, sore throat, wounds that don't heal, pain or trouble when passing urine, general feeling of discomfort or being unwell Pain, tingling, or numbness in the hands or feet, muscle weakness, change in vision, confusion or trouble speaking, loss of balance or coordination, trouble walking, seizures Painful swelling, warmth, or redness of the skin, blisters or sores at the infusion site Shortness of breath or trouble breathing Side effects that usually do not require medical attention (report to your care team if they continue or are bothersome): Constipation Diarrhea Hair loss Loss of appetite Nausea Stomach cramping Vomiting This list may not describe all possible side effects. Call your doctor for medical advice about side effects. You may report side effects to FDA at 1-800-FDA-1088. Where should I keep my medication? This medication is given in a hospital or clinic. It will not be stored at home. NOTE: This sheet is a summary. It may not cover all possible information. If you have questions about this medicine, talk to your doctor,  pharmacist, or health care provider.  2024 Elsevier/Gold Standard (2021-10-01 00:00:00)  Cyclophosphamide Injection What is this medication? CYCLOPHOSPHAMIDE (sye kloe FOSS fa mide) treats some types of cancer. It works by slowing down the growth of cancer cells. This medicine may be used for other purposes; ask your health care provider or pharmacist if you have questions. COMMON BRAND NAME(S): Cyclophosphamide, Cytoxan, Neosar What should I tell my care team before I take this medication? They need to know if you have any of these conditions: Heart disease Irregular heartbeat or rhythm Infection Kidney problems Liver disease Low blood cell levels (white cells, platelets, or red blood cells) Lung disease Previous radiation Trouble passing urine An unusual or allergic reaction to cyclophosphamide, other medications, foods, dyes, or preservatives Pregnant or trying to  get pregnant Breast-feeding How should I use this medication? This medication is injected into a vein. It is given by your care team in a hospital or clinic setting. Talk to your care team about the use of this medication in children. Special care may be needed. Overdosage: If you think you have taken too much of this medicine contact a poison control center or emergency room at once. NOTE: This medicine is only for you. Do not share this medicine with others. What if I miss a dose? Keep appointments for follow-up doses. It is important not to miss your dose. Call your care team if you are unable to keep an appointment. What may interact with this medication? Amphotericin B Amiodarone Azathioprine Certain antivirals for HIV or hepatitis Certain medications for blood pressure, such as enalapril, lisinopril, quinapril Cyclosporine Diuretics Etanercept Indomethacin Medications that relax muscles Metronidazole Natalizumab Tamoxifen Warfarin This list may not describe all possible interactions. Give your health  care provider a list of all the medicines, herbs, non-prescription drugs, or dietary supplements you use. Also tell them if you smoke, drink alcohol, or use illegal drugs. Some items may interact with your medicine. What should I watch for while using this medication? This medication may make you feel generally unwell. This is not uncommon as chemotherapy can affect healthy cells as well as cancer cells. Report any side effects. Continue your course of treatment even though you feel ill unless your care team tells you to stop. You may need blood work while you are taking this medication. This medication may increase your risk of getting an infection. Call your care team for advice if you get a fever, chills, sore throat, or other symptoms of a cold or flu. Do not treat yourself. Try to avoid being around people who are sick. Avoid taking medications that contain aspirin, acetaminophen, ibuprofen, naproxen, or ketoprofen unless instructed by your care team. These medications may hide a fever. Be careful brushing or flossing your teeth or using a toothpick because you may get an infection or bleed more easily. If you have any dental work done, tell your dentist you are receiving this medication. Drink water or other fluids as directed. Urinate often, even at night. Some products may contain alcohol. Ask your care team if this medication contains alcohol. Be sure to tell all care teams you are taking this medicine. Certain medicines, like metronidazole and disulfiram, can cause an unpleasant reaction when taken with alcohol. The reaction includes flushing, headache, nausea, vomiting, sweating, and increased thirst. The reaction can last from 30 minutes to several hours. Talk to your care team if you wish to become pregnant or think you might be pregnant. This medication can cause serious birth defects if taken during pregnancy and for 1 year after the last dose. A negative pregnancy test is required before  starting this medication. A reliable form of contraception is recommended while taking this medication and for 1 year after the last dose. Talk to your care team about reliable forms of contraception. Do not father a child while taking this medication and for 4 months after the last dose. Use a condom during this time period. Do not breast-feed while taking this medication or for 1 week after the last dose. This medication may cause infertility. Talk to your care team if you are concerned about your fertility. Talk to your care team about your risk of cancer. You may be more at risk for certain types of cancer if you take this medication.  What side effects may I notice from receiving this medication? Side effects that you should report to your care team as soon as possible: Allergic reactions--skin rash, itching, hives, swelling of the face, lips, tongue, or throat Dry cough, shortness of breath or trouble breathing Heart failure--shortness of breath, swelling of the ankles, feet, or hands, sudden weight gain, unusual weakness or fatigue Heart muscle inflammation--unusual weakness or fatigue, shortness of breath, chest pain, fast or irregular heartbeat, dizziness, swelling of the ankles, feet, or hands Heart rhythm changes--fast or irregular heartbeat, dizziness, feeling faint or lightheaded, chest pain, trouble breathing Infection--fever, chills, cough, sore throat, wounds that don't heal, pain or trouble when passing urine, general feeling of discomfort or being unwell Kidney injury--decrease in the amount of urine, swelling of the ankles, hands, or feet Liver injury--right upper belly pain, loss of appetite, nausea, light-colored stool, dark yellow or brown urine, yellowing skin or eyes, unusual weakness or fatigue Low red blood cell level--unusual weakness or fatigue, dizziness, headache, trouble breathing Low sodium level--muscle weakness, fatigue, dizziness, headache, confusion Red or dark  brown urine Unusual bruising or bleeding Side effects that usually do not require medical attention (report to your care team if they continue or are bothersome): Hair loss Irregular menstrual cycles or spotting Loss of appetite Nausea Pain, redness, or swelling with sores inside the mouth or throat Vomiting This list may not describe all possible side effects. Call your doctor for medical advice about side effects. You may report side effects to FDA at 1-800-FDA-1088. Where should I keep my medication? This medication is given in a hospital or clinic. It will not be stored at home. NOTE: This sheet is a summary. It may not cover all possible information. If you have questions about this medicine, talk to your doctor, pharmacist, or health care provider.  2024 Elsevier/Gold Standard (2021-11-22 00:00:00)  Rituximab Injection What is this medication? RITUXIMAB (ri TUX i mab) treats leukemia and lymphoma. It works by blocking a protein that causes cancer cells to grow and multiply. This helps to slow or stop the spread of cancer cells. It may also be used to treat autoimmune conditions, such as arthritis. It works by slowing down an overactive immune system. It is a monoclonal antibody. This medicine may be used for other purposes; ask your health care provider or pharmacist if you have questions. COMMON BRAND NAME(S): RIABNI, Rituxan, RUXIENCE, truxima What should I tell my care team before I take this medication? They need to know if you have any of these conditions: Chest pain Heart disease Immune system problems Infection, such as chickenpox, cold sores, hepatitis B, herpes Irregular heartbeat or rhythm Kidney disease Low blood counts, such as low white cells, platelets, red cells Lung disease Recent or upcoming vaccine An unusual or allergic reaction to rituximab, other medications, foods, dyes, or preservatives Pregnant or trying to get pregnant Breast-feeding How should I use  this medication? This medication is injected into a vein. It is given by a care team in a hospital or clinic setting. A special MedGuide will be given to you before each treatment. Be sure to read this information carefully each time. Talk to your care team about the use of this medication in children. While this medication may be prescribed for children as young as 6 months for selected conditions, precautions do apply. Overdosage: If you think you have taken too much of this medicine contact a poison control center or emergency room at once. NOTE: This medicine is only for  you. Do not share this medicine with others. What if I miss a dose? Keep appointments for follow-up doses. It is important not to miss your dose. Call your care team if you are unable to keep an appointment. What may interact with this medication? Do not take this medication with any of the following: Live vaccines This medication may also interact with the following: Cisplatin This list may not describe all possible interactions. Give your health care provider a list of all the medicines, herbs, non-prescription drugs, or dietary supplements you use. Also tell them if you smoke, drink alcohol, or use illegal drugs. Some items may interact with your medicine. What should I watch for while using this medication? Your condition will be monitored carefully while you are receiving this medication. You may need blood work while taking this medication. This medication can cause serious infusion reactions. To reduce the risk your care team may give you other medications to take before receiving this one. Be sure to follow the directions from your care team. This medication may increase your risk of getting an infection. Call your care team for advice if you get a fever, chills, sore throat, or other symptoms of a cold or flu. Do not treat yourself. Try to avoid being around people who are sick. Call your care team if you are around  anyone with measles, chickenpox, or if you develop sores or blisters that do not heal properly. Avoid taking medications that contain aspirin, acetaminophen, ibuprofen, naproxen, or ketoprofen unless instructed by your care team. These medications may hide a fever. This medication may cause serious skin reactions. They can happen weeks to months after starting the medication. Contact your care team right away if you notice fevers or flu-like symptoms with a rash. The rash may be red or purple and then turn into blisters or peeling of the skin. You may also notice a red rash with swelling of the face, lips, or lymph nodes in your neck or under your arms. In some patients, this medication may cause a serious brain infection that may cause death. If you have any problems seeing, thinking, speaking, walking, or standing, tell your care team right away. If you cannot reach your care team, urgently seek another source of medical care. Talk to your care team if you may be pregnant. Serious birth defects can occur if you take this medication during pregnancy and for 12 months after the last dose. You will need a negative pregnancy test before starting this medication. Contraception is recommended while taking this medication and for 12 months after the last dose. Your care team can help you find the option that works for you. Do not breastfeed while taking this medication and for at least 6 months after the last dose. What side effects may I notice from receiving this medication? Side effects that you should report to your care team as soon as possible: Allergic reactions or angioedema--skin rash, itching or hives, swelling of the face, eyes, lips, tongue, arms, or legs, trouble swallowing or breathing Bowel blockage--stomach cramping, unable to have a bowel movement or pass gas, loss of appetite, vomiting Dizziness, loss of balance or coordination, confusion or trouble speaking Heart attack--pain or tightness in  the chest, shoulders, arms, or jaw, nausea, shortness of breath, cold or clammy skin, feeling faint or lightheaded Heart rhythm changes--fast or irregular heartbeat, dizziness, feeling faint or lightheaded, chest pain, trouble breathing Infection--fever, chills, cough, sore throat, wounds that don't heal, pain or trouble when passing  urine, general feeling of discomfort or being unwell Infusion reactions--chest pain, shortness of breath or trouble breathing, feeling faint or lightheaded Kidney injury--decrease in the amount of urine, swelling of the ankles, hands, or feet Liver injury--right upper belly pain, loss of appetite, nausea, light-colored stool, dark yellow or brown urine, yellowing skin or eyes, unusual weakness or fatigue Redness, blistering, peeling, or loosening of the skin, including inside the mouth Stomach pain that is severe, does not go away, or gets worse Tumor lysis syndrome (TLS)--nausea, vomiting, diarrhea, decrease in the amount of urine, dark urine, unusual weakness or fatigue, confusion, muscle pain or cramps, fast or irregular heartbeat, joint pain Side effects that usually do not require medical attention (report to your care team if they continue or are bothersome): Headache Joint pain Nausea Runny or stuffy nose Unusual weakness or fatigue This list may not describe all possible side effects. Call your doctor for medical advice about side effects. You may report side effects to FDA at 1-800-FDA-1088. Where should I keep my medication? This medication is given in a hospital or clinic. It will not be stored at home. NOTE: This sheet is a summary. It may not cover all possible information. If you have questions about this medicine, talk to your doctor, pharmacist, or health care provider.  2024 Elsevier/Gold Standard (2021-11-28 00:00:00)

## 2024-07-10 ENCOUNTER — Encounter: Payer: Self-pay | Admitting: Hematology and Oncology

## 2024-07-11 ENCOUNTER — Telehealth: Payer: Self-pay

## 2024-07-11 ENCOUNTER — Encounter (HOSPITAL_COMMUNITY): Payer: Self-pay

## 2024-07-11 ENCOUNTER — Inpatient Hospital Stay

## 2024-07-11 ENCOUNTER — Encounter: Payer: Self-pay | Admitting: Hematology and Oncology

## 2024-07-11 VITALS — BP 118/99 | HR 84 | Temp 98.0°F | Resp 17

## 2024-07-11 DIAGNOSIS — C8330 Diffuse large B-cell lymphoma, unspecified site: Secondary | ICD-10-CM

## 2024-07-11 DIAGNOSIS — Z5111 Encounter for antineoplastic chemotherapy: Secondary | ICD-10-CM | POA: Diagnosis not present

## 2024-07-11 MED ORDER — PEGFILGRASTIM-CBQV 6 MG/0.6ML ~~LOC~~ SOSY
6.0000 mg | PREFILLED_SYRINGE | Freq: Once | SUBCUTANEOUS | Status: AC
  Administered 2024-07-11: 6 mg via SUBCUTANEOUS
  Filled 2024-07-11: qty 0.6

## 2024-07-11 NOTE — Telephone Encounter (Signed)
LM for patient that this nurse was calling to see how they were doing after their treatment. Please call back to Dr.  Dorsey's nurse at [phone] if they have any questions or concerns regarding the treatment.  ?

## 2024-07-11 NOTE — Telephone Encounter (Signed)
-----   Message from Nurse Ileana HERO, RN sent at 07/08/2024  2:51 PM EST ----- Regarding: first time rchop. pt of [facility] first time rchop. pt of [facility]. Tolerated well. Please check in with pt when possible, thanks!

## 2024-07-11 NOTE — Telephone Encounter (Signed)
 Pt requested to have the virtual visit tomorrow with the social worker cancelled. He has been speaking with a SSW and has a meeting today at 5 with one.The SSW can call and speak with him and see if appointment  is necessary prior to scheduling.

## 2024-07-11 NOTE — Telephone Encounter (Signed)
 [facility] CSW Progress Note  Patient cancelled telephone call scheduled for tomorrow with CSW. Patient reported having an appointment with a child psychotherapist on this date. CSW uncertain of scheduled SW meeting. CSW confirmed meeting is scheduled with [facility] to establish in home nursing services. No follow up scheduled.   CSW left vm explaining purpose for call.    Lizbeth Sprague, LCSW Clinical Social Worker [facility]

## 2024-07-12 ENCOUNTER — Inpatient Hospital Stay

## 2024-07-12 ENCOUNTER — Telehealth: Payer: Self-pay

## 2024-07-12 MED ORDER — APIXABAN 5 MG PO TABS: 5.0000 mg | ORAL_TABLET | Freq: Two times a day (BID) | ORAL | 11 refills | Status: AC

## 2024-07-12 NOTE — Telephone Encounter (Signed)
 Called patient back about message. Sent in Eliquis  5 mg BID for patient to start. Patient is already taking Toprol  25. Will see if Dr. Delford wants to increase this or change to the lopressor  25 mg BID. Sent message to RX prior auth for Eliquis . Patient will keep his appointment in January.

## 2024-07-12 NOTE — Telephone Encounter (Signed)
-----   Message from Maude Emmer, MD sent at 07/10/2024  5:04 PM EST ----- His renal function and CBC seem ok to start DOAC and BP recorded in recent notes ok Would start eliquis  5 mg bid and lopressor  25 mg BID  His RBBB is old. I believe he has an appointment with me within 2 weeks  Triage if he is not already scheduled  with me have him see afib clinic in 1-2 weeks and callin his meds   Nish ----- Message ----- From: Federico Norleen ONEIDA MADISON, MD Sent: 07/10/2024   2:53 PM EST To: Maude JAYSON Emmer, MD  Dr. Emmer,  I am reaching out to you about Mr. Bolander, a mutual patient of ours.  He is currently undergoing treatment for newly diagnosed diffuse large B-cell lymphoma.  At the time his port was being placed he was found to have an arrhythmia consistent with atrial fibrillation.  In the infusion room on Friday during his first treatment we ordered a EKG which does confirm atrial fibrillation.  He is aware of the results of the EKG and requested that we reach out to you.  Please let me know if there is anything you would like to start for this new finding.  Best,  Gordy Federico Hematology/Oncology

## 2024-07-13 MED ORDER — METOPROLOL SUCCINATE ER 50 MG PO TB24: 50.0000 mg | ORAL_TABLET | Freq: Every day | ORAL | 3 refills | Status: AC

## 2024-07-13 MED ORDER — ASPIRIN 81 MG PO TBEC: 81.0000 mg | DELAYED_RELEASE_TABLET | Freq: Every day | ORAL | Status: AC

## 2024-07-13 NOTE — Telephone Encounter (Signed)
 Called patient back to let him know, per Dr. Delford, he is to continue taking Aspirin  but at 81 mg daily. Patient verbalized understanding.

## 2024-07-13 NOTE — Telephone Encounter (Signed)
 Called patient back about increasing Metoprolol  to 50 mg. Patient has another question about his Aspirin . The [facility] pharmacy told him he should not be taking Aspirin  with Eliquis . Right now patient's Aspirin  is 325 mg. Will see if patient needs to be on Aspirin , with a history of CAD and CABG, and if so, what dose, 325 mg or 81 mg.

## 2024-07-15 ENCOUNTER — Other Ambulatory Visit (HOSPITAL_COMMUNITY): Payer: Self-pay

## 2024-07-15 ENCOUNTER — Telehealth: Payer: Self-pay | Admitting: Pharmacy Technician

## 2024-07-15 ENCOUNTER — Encounter: Payer: Self-pay | Admitting: Hematology and Oncology

## 2024-07-15 NOTE — Telephone Encounter (Signed)
 Pharmacy Patient Advocate Encounter   Received notification from Physician's Office that prior authorization for eliquis  5 mg is required/requested.   Insurance verification completed.   The patient is insured through [employer].   Per test claim: Refill too soon. PA is not needed at this time. Medication was filled 07/12/24. Next eligible fill date is 08/04/24.

## 2024-07-18 ENCOUNTER — Telehealth: Payer: Self-pay

## 2024-07-18 ENCOUNTER — Other Ambulatory Visit: Payer: Self-pay | Admitting: Hematology and Oncology

## 2024-07-18 ENCOUNTER — Encounter: Payer: Self-pay | Admitting: Hematology and Oncology

## 2024-07-18 DIAGNOSIS — C8330 Diffuse large B-cell lymphoma, unspecified site: Secondary | ICD-10-CM

## 2024-07-18 NOTE — Telephone Encounter (Signed)
 Returned call to pt regarding his next round of treatment.  Requested scheduling to contact him for next appointments. Patient voiced understanding that he would receive a call from scheduling.

## 2024-07-19 NOTE — Progress Notes (Signed)
 Pt left a VM for this NN on 12/29 requesting that his C2 be scheduled, if possible, on 1/9 so his dtr who lives out of town could be there for his appt. NN reached out to Dr Lafonda scheduler, Caryl, this morning with pt's request.  NN called pt to let him know that his request was sent to Dr Lafonda scheduled. Pt states that he saw a MyChart message about his scheduled appts. No other concerns from pt at this time.

## 2024-07-25 ENCOUNTER — Other Ambulatory Visit: Payer: Self-pay | Admitting: Family

## 2024-07-25 NOTE — Progress Notes (Signed)
 Patient ID: Benjamin Adkins, male   DOB: Dec 03, 1940, 84 y.o.   MRN: [id number]     This is a 84 y.o.  white male patient f/U CAD  In 2005 he had a perioperative MI after left knee replacement. He required urgent stenting to the RCA and subsequent CABG. He did have a negative treadmill prior to his knee surgery in 2005. He has done well since that time. He has a history of a right bundle branch block.his lipids are controlled. He has no history of diabetes and does not smoke.   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 Had Right internal jugular port inserted Stage 3 followed by Dr Federico First Rx with R-miniCHOP was 07/08/24. During that visit noted to be in afib. Reviewed ECG from 07/08/24 and was in afib with chronic RBBB He was started on eliquis  5 mg bid on 07/12/24 He is on Toprol  50 mg daily for rate control  Echo done 06/28/24 with EF 55-60% moderate MR severe LAE and mild AR   He has no cardiac symptoms. Does not note afib. He is a bit frustrated with sudden change in health and all testing. Long discussion with him and his daughter [name] There is no getting around that the afib will complicate his oncology visits and rate control may be harder while getting chemo. Shared decision making favor starting amiodarone  and discuss in 4-6 weeks to see if he wants [facility] depending on response to chemo and CBC counts      ROS: Denies fever, malais, weight loss, blurry vision, decreased  visual acuity, cough, sputum, SOB, hemoptysis, pleuritic pain, palpitaitons, heartburn, abdominal pain, melena, lower extremity edema, claudication, or rash.  All other systems reviewed and negative  General: BP 105/69 (BP Location: Left Arm)   Pulse 85   Ht 5' 9 (1.753 m)   Wt 170 lb (77.1 kg)   SpO2 95%   BMI 25.10 kg/m  Affect appropriate Healthy:  appears stated age HEENT: normal Neck supple with no adenopathy JVP normal no bruits no thyromegaly Lungs clear with no wheezing and good diaphragmatic motion Heart:  S1/S2 no murmur, no rub, gallop or click PMI normal Right internal jugular port Abdomen: benighn, BS positve, no tenderness, no AAA no bruit.  No HSM or HJR Distal pulses intact with no bruits No edema Neuro non-focal Skin warm and dry No muscular weakness Post bilateral TKR;s     Current Outpatient Medications  Medication Sig Dispense Refill   allopurinol  (ZYLOPRIM ) 300 MG tablet Take 1 tablet (300 mg total) by mouth daily. 90 tablet 1   amoxicillin  (AMOXIL ) 500 MG capsule TAKE 4 CAPSULES BY MOUTH AS NEEDED FOR DENTAL PROCEDURE 20 capsule 0   apixaban  (ELIQUIS ) 5 MG TABS tablet Take 1 tablet (5 mg total) by mouth 2 (two) times daily. 60 tablet 11   aspirin  EC 81 MG tablet Take 1 tablet (81 mg  total) by mouth daily. Swallow whole.     atorvastatin  (LIPITOR) 40 MG tablet Take 1 tablet (40 mg total) by mouth daily. 100 tablet 0   azelastine  (ASTELIN ) 0.1 % nasal spray Place 2 sprays into both nostrils 2 (two) times daily. Use in each nostril as directed 30 mL 12   benzonatate  (TESSALON ) 200 MG capsule Take 1 capsule (200 mg total) by mouth 3 (three) times daily as needed. 45 capsule 1   celecoxib  (CELEBREX ) 100 MG capsule Take 1 capsule (100 mg total) by mouth 2 (two) times daily as needed for moderate pain. 180 capsule 1   cetirizine (ZYRTEC) 10 MG tablet Take 10 mg by mouth daily.     ezetimibe  (ZETIA ) 10 MG tablet Take 1 tablet (10 mg total) by mouth daily. 90  tablet 1   fluticasone  (FLONASE ) 50 MCG/ACT nasal spray Place 2 sprays into both nostrils daily. (Patient taking differently: Place 2 sprays into both nostrils as needed for allergies.) 16 g 6   hydrocortisone  (ANUSOL -HC) 25 MG suppository Place 1 suppository (25 mg total) rectally 2 (two) times daily as needed for hemorrhoids. 12 suppository 1   lidocaine -prilocaine  (EMLA ) cream Apply 1 Application topically as needed. 30 g 0   metoprolol  succinate (TOPROL  XL) 50 MG 24 hr tablet Take 1 tablet (50 mg total) by mouth daily. Take with or immediately following a meal. 90 tablet 3   Multiple Vitamin (MULTIVITAMIN) capsule Take 1 capsule by mouth daily.     nitroGLYCERIN  (NITROSTAT ) 0.4 MG SL tablet Place 1 tablet (0.4 mg total) under the tongue every 5 (five) minutes as needed. 25 tablet 3   omeprazole  (PRILOSEC) 40 MG capsule Take 1 capsule (40 mg total) by mouth daily. 90 capsule 1   ondansetron  (ZOFRAN ) 8 MG tablet Take 1 tablet (8 mg total) by mouth every 8 (eight) hours as needed. 30 tablet 0   predniSONE  (DELTASONE ) 20 MG tablet Take 3 tablets (60 mg total) by mouth daily with breakfast. 15 tablet 5   prochlorperazine  (COMPAZINE ) 10 MG tablet TAKE 1 TABLET BY MOUTH EVERY 6 HOURS AS NEEDED FOR NAUSEA OR VOMITING. 30 tablet 0   triamcinolone ointment (KENALOG) 0.1 % Apply topically.     No current facility-administered medications for this visit.    Allergies  Short ragweed pollen ext  Electrocardiogram:  07/28/2024   SR rate  RBBB LAFB old ILMI  Assessment and Plan CAD/CABG:  Stable with no angina and good activity level.  Continue medical Rx last myovue 2013 old IMI no ischemia  He is active and does not want stress test as he had normal one right before his MI No indication for cath at this time   Chol:  On statin LDL at goal 58 08/21/22  on lipitor 40 mg and zetia  10 mg labs with primary   GERD:  On prilosec discussed low carb diet   RBBB: chronic no high grade AV block yearly ECG    PSA:  Chronically elevated 4.09 update with primary  OSA:  wears CPAP recently received new machine F/U Dr Neda  PAF:  noted at first chemo 07/08/24 Concern for dropping Hct/PLT with bleeding risk during chemo.  Favor starting amiodarone  given CAD and consider one attempt at [facility] Has severe LAE so feel that AAT would be beneficial to prevent ERAF. Will start amiodarone  and see in 4-6 weeks If chemo going well, and having good response to Rx with stable CBC can consider one attempt at [facility]   Lymphoma:  Diffuse B cell stage 3 Followed by Dr Federico Contineu R-mini CHOP RX   Amiodarone  200 mg daily   F/U with me in 4-6 weeks  Maude Emmer

## 2024-07-28 ENCOUNTER — Ambulatory Visit: Attending: Cardiovascular Disease | Admitting: Cardiovascular Disease

## 2024-07-28 VITALS — BP 105/69 | HR 85 | Ht 69.0 in | Wt 170.0 lb

## 2024-07-28 DIAGNOSIS — I25810 Atherosclerosis of coronary artery bypass graft(s) without angina pectoris: Secondary | ICD-10-CM | POA: Diagnosis not present

## 2024-07-28 DIAGNOSIS — I10 Essential (primary) hypertension: Secondary | ICD-10-CM

## 2024-07-28 DIAGNOSIS — C8332 Diffuse large B-cell lymphoma, intrathoracic lymph nodes: Secondary | ICD-10-CM | POA: Diagnosis not present

## 2024-07-28 DIAGNOSIS — I480 Paroxysmal atrial fibrillation: Secondary | ICD-10-CM

## 2024-07-28 MED ORDER — AMIODARONE HCL 200 MG PO TABS: 200.0000 mg | ORAL_TABLET | Freq: Every day | ORAL | 3 refills | Status: AC

## 2024-07-28 NOTE — Patient Instructions (Signed)
 Medication Instructions:  Start: Amiodarone  (Pacerone ) 200 mg, one tablet, once daily  Lab Work: None  Follow-Up: At [facility], you and your health needs are our priority.  As part of our continuing mission to provide you with exceptional heart care, our providers are all part of one team.  This team includes your primary Cardiologist (physician) and Advanced Practice Providers or APPs (Physician Assistants and Nurse Practitioners) who all work together to provide you with the care you need, when you need it.  Your next appointment:   4 -6 week(s) (Potentially 09/09/2024) We will call you next week to schedule this.   Provider:   Maude Emmer, MD

## 2024-07-29 ENCOUNTER — Inpatient Hospital Stay: Admitting: Hematology and Oncology

## 2024-07-29 ENCOUNTER — Inpatient Hospital Stay: Attending: Physician Assistant

## 2024-07-29 ENCOUNTER — Inpatient Hospital Stay

## 2024-07-29 ENCOUNTER — Inpatient Hospital Stay: Attending: Physician Assistant | Admitting: Nutrition

## 2024-07-29 VITALS — BP 102/64 | HR 90 | Temp 98.3°F | Resp 18

## 2024-07-29 VITALS — BP 115/69 | HR 87 | Temp 98.1°F | Resp 16 | Ht 69.0 in | Wt 168.0 lb

## 2024-07-29 DIAGNOSIS — R161 Splenomegaly, not elsewhere classified: Secondary | ICD-10-CM | POA: Diagnosis not present

## 2024-07-29 DIAGNOSIS — D84821 Immunodeficiency due to drugs: Secondary | ICD-10-CM | POA: Diagnosis not present

## 2024-07-29 DIAGNOSIS — Z5111 Encounter for antineoplastic chemotherapy: Secondary | ICD-10-CM | POA: Diagnosis present

## 2024-07-29 DIAGNOSIS — C8330 Diffuse large B-cell lymphoma, unspecified site: Secondary | ICD-10-CM

## 2024-07-29 DIAGNOSIS — C8333 Diffuse large B-cell lymphoma, intra-abdominal lymph nodes: Secondary | ICD-10-CM | POA: Diagnosis present

## 2024-07-29 DIAGNOSIS — Z79899 Other long term (current) drug therapy: Secondary | ICD-10-CM | POA: Diagnosis not present

## 2024-07-29 DIAGNOSIS — R948 Abnormal results of function studies of other organs and systems: Secondary | ICD-10-CM

## 2024-07-29 DIAGNOSIS — T451X5A Adverse effect of antineoplastic and immunosuppressive drugs, initial encounter: Secondary | ICD-10-CM | POA: Diagnosis not present

## 2024-07-29 LAB — CBC WITH DIFFERENTIAL (CANCER CENTER ONLY)
Abs Immature Granulocytes: 0.38 K/uL — ABNORMAL HIGH (ref 0.00–0.07)
Basophils Absolute: 0.1 K/uL (ref 0.0–0.1)
Basophils Relative: 1 %
Eosinophils Absolute: 0.2 K/uL (ref 0.0–0.5)
Eosinophils Relative: 1 %
HCT: 32.8 % — ABNORMAL LOW (ref 39.0–52.0)
Hemoglobin: 10.7 g/dL — ABNORMAL LOW (ref 13.0–17.0)
Immature Granulocytes: 2 %
Lymphocytes Relative: 7 %
Lymphs Abs: 1.2 K/uL (ref 0.7–4.0)
MCH: 27.4 pg (ref 26.0–34.0)
MCHC: 32.6 g/dL (ref 30.0–36.0)
MCV: 83.9 fL (ref 80.0–100.0)
Monocytes Absolute: 1 K/uL (ref 0.1–1.0)
Monocytes Relative: 5 %
Neutro Abs: 15.5 K/uL — ABNORMAL HIGH (ref 1.7–7.7)
Neutrophils Relative %: 84 %
Platelet Count: 180 K/uL (ref 150–400)
RBC: 3.91 MIL/uL — ABNORMAL LOW (ref 4.22–5.81)
RDW: 20.4 % — ABNORMAL HIGH (ref 11.5–15.5)
WBC Count: 18.3 K/uL — ABNORMAL HIGH (ref 4.0–10.5)
nRBC: 0 % (ref 0.0–0.2)

## 2024-07-29 LAB — CMP (CANCER CENTER ONLY)
ALT: 23 U/L (ref 0–44)
AST: 30 U/L (ref 15–41)
Albumin: 3.7 g/dL (ref 3.5–5.0)
Alkaline Phosphatase: 144 U/L — ABNORMAL HIGH (ref 38–126)
Anion gap: 9 (ref 5–15)
BUN: 10 mg/dL (ref 8–23)
CO2: 26 mmol/L (ref 22–32)
Calcium: 9.2 mg/dL (ref 8.9–10.3)
Chloride: 101 mmol/L (ref 98–111)
Creatinine: 0.88 mg/dL (ref 0.61–1.24)
GFR, Estimated: >60 mL/min
Glucose, Bld: 161 mg/dL — ABNORMAL HIGH (ref 70–99)
Potassium: 3.9 mmol/L (ref 3.5–5.1)
Sodium: 136 mmol/L (ref 135–145)
Total Bilirubin: 0.6 mg/dL (ref 0.0–1.2)
Total Protein: 6.3 g/dL — ABNORMAL LOW (ref 6.5–8.1)

## 2024-07-29 MED ORDER — DOXORUBICIN HCL CHEMO IV INJECTION 2 MG/ML
25.0000 mg/m2 | Freq: Once | INTRAVENOUS | Status: AC
  Administered 2024-07-29: 50 mg via INTRAVENOUS
  Filled 2024-07-29: qty 25

## 2024-07-29 MED ORDER — SODIUM CHLORIDE 0.9 % IV SOLN
375.0000 mg/m2 | Freq: Once | INTRAVENOUS | Status: AC
  Administered 2024-07-29: 700 mg via INTRAVENOUS
  Filled 2024-07-29: qty 20

## 2024-07-29 MED ORDER — DEXAMETHASONE SOD PHOSPHATE PF 10 MG/ML IJ SOLN
10.0000 mg | Freq: Once | INTRAMUSCULAR | Status: AC
  Administered 2024-07-29: 10 mg via INTRAVENOUS
  Filled 2024-07-29: qty 1

## 2024-07-29 MED ORDER — PALONOSETRON HCL INJECTION 0.25 MG/5ML
0.2500 mg | Freq: Once | INTRAVENOUS | Status: AC
  Administered 2024-07-29: 0.25 mg via INTRAVENOUS
  Filled 2024-07-29: qty 5

## 2024-07-29 MED ORDER — SODIUM CHLORIDE 0.9 % IV SOLN
400.0000 mg/m2 | Freq: Once | INTRAVENOUS | Status: AC
  Administered 2024-07-29: 780 mg via INTRAVENOUS
  Filled 2024-07-29: qty 39

## 2024-07-29 MED ORDER — DIPHENHYDRAMINE HCL 25 MG PO CAPS
25.0000 mg | ORAL_CAPSULE | Freq: Once | ORAL | Status: AC
  Administered 2024-07-29: 25 mg via ORAL
  Filled 2024-07-29: qty 1

## 2024-07-29 MED ORDER — APREPITANT 130 MG/18ML IV EMUL
130.0000 mg | Freq: Once | INTRAVENOUS | Status: AC
  Administered 2024-07-29: 130 mg via INTRAVENOUS
  Filled 2024-07-29: qty 18

## 2024-07-29 MED ORDER — VINCRISTINE SULFATE CHEMO INJECTION 1 MG/ML
1.0000 mg | Freq: Once | INTRAVENOUS | Status: AC
  Administered 2024-07-29: 1 mg via INTRAVENOUS
  Filled 2024-07-29: qty 1

## 2024-07-29 MED ORDER — ACETAMINOPHEN 325 MG PO TABS
650.0000 mg | ORAL_TABLET | Freq: Once | ORAL | Status: AC
  Administered 2024-07-29: 650 mg via ORAL
  Filled 2024-07-29: qty 2

## 2024-07-29 MED ORDER — SODIUM CHLORIDE 0.9 % IV SOLN: INTRAVENOUS | Status: DC

## 2024-07-29 NOTE — Patient Instructions (Signed)
 Hold Amiodarone  at this time  [facility] - A DEPT OF [place]  Discharge Instructions: Thank you for choosing [facility] to provide your oncology and hematology care.   If you have a lab appointment with the Cancer Center, please go directly to the Cancer Center and check in at the registration area.   Wear comfortable clothing and clothing appropriate for easy access to any Portacath or PICC line.   We strive to give you quality time with your provider. You may need to reschedule your appointment if you arrive late (15 or more minutes).  Arriving late affects you and other patients whose appointments are after yours.  Also, if you miss three or more appointments without notifying the office, you may be dismissed from the clinic at the providers discretion.      For prescription refill requests, have your pharmacy contact our office and allow 72 hours for refills to be completed.    Today you received the following chemotherapy and/or immunotherapy agents Doxorubicin , Vincristine , Cytoxan , Rituximab       To help prevent nausea and vomiting after your treatment, we encourage you to take your nausea medication as directed.  BELOW ARE SYMPTOMS THAT SHOULD BE REPORTED IMMEDIATELY: *FEVER GREATER THAN 100.4 F (38 C) OR HIGHER *CHILLS OR SWEATING *NAUSEA AND VOMITING THAT IS NOT CONTROLLED WITH YOUR NAUSEA MEDICATION *UNUSUAL SHORTNESS OF BREATH *UNUSUAL BRUISING OR BLEEDING *URINARY PROBLEMS (pain or burning when urinating, or frequent urination) *BOWEL PROBLEMS (unusual diarrhea, constipation, pain near the anus) TENDERNESS IN MOUTH AND THROAT WITH OR WITHOUT PRESENCE OF ULCERS (sore throat, sores in mouth, or a toothache) UNUSUAL RASH, SWELLING OR PAIN  UNUSUAL VAGINAL DISCHARGE OR ITCHING   Items with * indicate a potential emergency and should be followed up as soon as possible or go to the Emergency Department if any problems should  occur.  Please show the CHEMOTHERAPY ALERT CARD or IMMUNOTHERAPY ALERT CARD at check-in to the Emergency Department and triage nurse.  Should you have questions after your visit or need to cancel or reschedule your appointment, please contact [facility] - A DEPT OF [facility]  Dept: [phone]  and follow the prompts.  Office hours are 8:00 a.m. to 4:30 p.m. Monday - Friday. Please note that voicemails left after 4:00 p.m. may not be returned until the following business day.  We are closed weekends and major holidays. You have access to a nurse at all times for urgent questions. Please call the main number to the clinic Dept: [phone] and follow the prompts.   For any non-urgent questions, you may also contact your provider using MyChart. We now offer e-Visits for anyone 22 and older to request care online for non-urgent symptoms. For details visit [email or web address].   Also download the MyChart app! Go to the app store, search MyChart, open the app, select [place], and log in with your MyChart username and password.

## 2024-07-29 NOTE — Progress Notes (Signed)
 Nutrition follow-up completed with patient during infusion for B-cell lymphoma.  Patient is followed by Dr. Federico.  He is receiving R-mini CHOP.  Weight: 168 pounds January 9 174 pounds December 19  *3% weight loss over approximately 3 weeks  Labs include glucose 161  Patient reports appetite is marginal.  He has tried to add extra calories and protein.  He drinks 1 Owyn protein shake daily.  This has approximately 170 cal and 20 g of protein.  He is interested in additional tips on increasing protein and calories.  He prefers food that has very low need for preparation as he lives alone and does not like to cook.  Nutrition diagnosis: Unintended weight loss continues.  Intervention: Recommended higher calorie protein shake and increase to 2 cartons daily. Reviewed multiple foods that could be purchased ready to eat or could be prepared and frozen ahead of time. Patient's daughter very helpful with ideas for ready to eat foods.  Monitoring, evaluation, goals: Patient will work to increase calories and protein to minimize further weight loss.  Next visit: Monday, February 2 during infusion with Joli.  **Disclaimer: This note was dictated with voice recognition software. Similar sounding words can inadvertently be transcribed and this note may contain transcription errors which may not have been corrected upon publication of note.**

## 2024-07-29 NOTE — Progress Notes (Unsigned)
 " [facility] [phone]   [phone]  PROGRESS NOTE  Patient Care Team: Amon Aloysius BRAVO, MD as PCP - General (Internal Medicine) Delford Maude BROCKS, MD as PCP - Cardiology (Cardiology) Timmy Maude SAUNDERS, MD as Consulting Physician (Oncology) Chauncey Redell Agent, MD as Consulting Physician (Urology)  Hematological/Oncological History # Diffuse Large B Cell Lymphoma, Stage III.  05/27/2024: establish care with Dr. Federico  06/02/2024: PET CT scan shows widespread metastatic disease to the nodal stations the abdomen, chest, and low neck. 06/23/2024: IR guided lymph node core biopsy confirms diffuse large B-cell lymphoma 07/08/2024: Cycle 1 Day 1 of R-miniCHOP 07/29/2024:  Cycle 2 Day 1 of R-miniCHOP  Interval History:  Benjamin Adkins 84 y.o. male with medical history significant for diffuse large B cell lymphoma who presents for a follow up visit. The patient's last visit was on 07/29/2024. In the interim since the last visit he completed chemotherapy education and presents today for C2D1 of R-miniCHOP  On exam today Benjamin Adkins reports he tolerated cycle 1 chemotherapy very well with little difficulty.  He reports that he does have some trouble with insomnia with the steroids and he will get about 2 hours of sleep on Christmas.  He reports that he reports that he feels better and more chipper while on his steroid pulse but does have issues with his sleep.  He has had no issues with nausea or vomiting or diarrhea.  He reports he is having some constipation for which he does have stool softeners and MiraLAX.  Reports he also recently had some homemade beef soup which has helped clear the path.  He reports his appetite has been good and his weight continues to drop, down to 168 pounds today.  He also reports that he saw Dr. Delford who made recommendations for Eliquis  as well as amiodarone  in order to set him up for a cardioversion.  Otherwise he has been his baseline level of  health and denies any fevers, chills, sweats.  A full 10 point ROS is otherwise negative.  MEDICAL HISTORY:  Past Medical History:  Diagnosis Date   Arthritis    Coronary atherosclerosis of unspecified type of vessel, native or graft    MI 2006, h/o CABG   DJD (degenerative joint disease)    Elevated prostate specific antigen (PSA)    Elevated PSA    Esophageal reflux    GERD (gastroesophageal reflux disease)    HTN (hypertension)    Hyperlipidemia    nmr lipoprofile 2004: LDL 154 (1653/711), HDL 42, TG 80.NMR  LDL =,125, but PMH  of MI LDL goal=,70   Hypertension    Myocardial infarction [facility])    2006   Other cataract    Personal history of colonic polyps    Shortness of breath    DOE   Sleep apnea    uses CPAP    SURGICAL HISTORY: Past Surgical History:  Procedure Laterality Date   CARDIAC CATHETERIZATION     2006   CATARACT EXTRACTION Bilateral    COLONOSCOPY W/ POLYPECTOMY     CORONARY ARTERY BYPASS GRAFT  2006   X 4    EYE SURGERY     HERNIA REPAIR     VENTRAL, LEFT & RIGHT GROIN   IR IMAGING GUIDED PORT INSERTION  07/07/2024   IR US  LIVER BIOPSY  06/23/2024   TOTAL KNEE ARTHROPLASTY  2006   TOTAL KNEE ARTHROPLASTY  08/25/2011   Procedure: TOTAL KNEE ARTHROPLASTY;  Surgeon: Lamar LABOR  Jane, MD;  Location: [facility] OR;  Service: Orthopedics;  Laterality: Right;  DR JANE CHRISTIAN 90 MINUTES FOR SURGERY    SOCIAL HISTORY: Social History   Socioeconomic History   Marital status: Widowed    Spouse name: Not on file   Number of children: 1   Years of education: Not on file   Highest education level: Master's degree (e.g., MA, MS, MEng, MEd, MSW, MBA)  Occupational History   Occupation: retired  Tobacco Use   Smoking status: Former    Current packs/day: 0.00    Average packs/day: 0.3 packs/day for 3.0 years (0.8 ttl pk-yrs)    Types: Cigarettes    Start date: 04/12/1959    Quit date: 08/12/1961    Years since quitting: 63.0   Smokeless tobacco: Never   Tobacco  comments:    during college  Vaping Use   Vaping status: Never Used  Substance and Sexual Activity   Alcohol use: Yes    Alcohol/week: 1.0 standard drink of alcohol    Types: 1 Glasses of wine per week    Comment: with dinner occ   Drug use: No   Sexual activity: Yes  Other Topics Concern   Not on file  Social History Narrative   Lives alone.    Has a daughter in [place] , [place] G-son   Daughter: [name] [phone]   Social Drivers of Health   Tobacco Use: Medium Risk (06/23/2024)   Patient History    Smoking Tobacco Use: Former    Smokeless Tobacco Use: Never    Passive Exposure: Not on Actuary Strain: Low Risk (05/13/2024)   Overall Financial Resource Strain (CARDIA)    Difficulty of Paying Living Expenses: Not hard at all  Food Insecurity: No Food Insecurity (05/26/2024)   Epic    Worried About Programme Researcher, Broadcasting/film/video in the Last Year: Never true    Ran Out of Food in the Last Year: Never true  Transportation Needs: No Transportation Needs (05/26/2024)   Epic    Lack of Transportation (Medical): No    Lack of Transportation (Non-Medical): No  Physical Activity: Sufficiently Active (05/13/2024)   Exercise Vital Sign    Days of Exercise per Week: 4 days    Minutes of Exercise per Session: 40 min  Stress: No Stress Concern Present (05/13/2024)   [facility] [employer] - Occupational Stress Questionnaire    Feeling of Stress: Only a little  Social Connections: Moderately Integrated (05/13/2024)   Social Connection and Isolation Panel    Frequency of Communication with Friends and Family: Three times a week    Frequency of Social Gatherings with Friends and Family: Three times a week    Attends Religious Services: More than 4 times per year    Active Member of Clubs or Organizations: Yes    Attends Banker Meetings: More than 4 times per year    Marital Status: Widowed  Intimate Partner Violence: Not At Risk (03/07/2024)    Epic    Fear of Current or Ex-Partner: No    Emotionally Abused: No    Physically Abused: No    Sexually Abused: No  Depression (PHQ2-9): Low Risk (07/29/2024)   Depression (PHQ2-9)    PHQ-2 Score: 0  Alcohol Screen: Low Risk (05/13/2024)   Alcohol Screen    Last Alcohol Screening Score (AUDIT): 2  Housing: Unknown (05/26/2024)   Epic    Unable to Pay for Housing in the Last Year: No  Number of Times Moved in the Last Year: Not on file    Homeless in the Last Year: No  Utilities: Not At Risk (05/26/2024)   Epic    Threatened with loss of utilities: No  Health Literacy: Adequate Health Literacy (03/07/2024)   B1300 Health Literacy    Frequency of need for help with medical instructions: Never    FAMILY HISTORY: Family History  Problem Relation Age of Onset   Hypertension Mother    Breast cancer Mother    Coronary artery disease Mother    Heart disease Mother    Arthritis Mother    Cancer Mother    Stomach cancer Father    Breast cancer Sister    Crohn's disease Sister    Arthritis Sister    Asthma Sister        childhood   Arthritis Brother    Heart disease Brother    Heart attack Paternal Grandfather 93   Colon cancer Neg Hx    Prostate cancer Neg Hx     ALLERGIES:  is allergic to short ragweed pollen ext.  MEDICATIONS:  Current Outpatient Medications  Medication Sig Dispense Refill   allopurinol  (ZYLOPRIM ) 300 MG tablet Take 1 tablet (300 mg total) by mouth daily. 90 tablet 1   amoxicillin  (AMOXIL ) 500 MG capsule TAKE 4 CAPSULES BY MOUTH AS NEEDED FOR DENTAL PROCEDURE 20 capsule 0   apixaban  (ELIQUIS ) 5 MG TABS tablet Take 1 tablet (5 mg total) by mouth 2 (two) times daily. 60 tablet 11   aspirin  EC 81 MG tablet Take 1 tablet (81 mg total) by mouth daily. Swallow whole.     atorvastatin  (LIPITOR) 40 MG tablet Take 1 tablet (40 mg total) by mouth daily. 100 tablet 0   azelastine  (ASTELIN ) 0.1 % nasal spray Place 2 sprays into both nostrils 2 (two) times daily. Use  in each nostril as directed 30 mL 12   benzonatate  (TESSALON ) 200 MG capsule Take 1 capsule (200 mg total) by mouth 3 (three) times daily as needed. 45 capsule 1   celecoxib  (CELEBREX ) 100 MG capsule Take 1 capsule (100 mg total) by mouth 2 (two) times daily as needed for moderate pain. 180 capsule 1   cetirizine (ZYRTEC) 10 MG tablet Take 10 mg by mouth daily.     ezetimibe  (ZETIA ) 10 MG tablet Take 1 tablet (10 mg total) by mouth daily. 90 tablet 1   fluticasone  (FLONASE ) 50 MCG/ACT nasal spray Place 2 sprays into both nostrils daily. (Patient taking differently: Place 2 sprays into both nostrils as needed for allergies.) 16 g 6   hydrocortisone  (ANUSOL -HC) 25 MG suppository Place 1 suppository (25 mg total) rectally 2 (two) times daily as needed for hemorrhoids. 12 suppository 1   lidocaine -prilocaine  (EMLA ) cream Apply 1 Application topically as needed. 30 g 0   metoprolol  succinate (TOPROL  XL) 50 MG 24 hr tablet Take 1 tablet (50 mg total) by mouth daily. Take with or immediately following a meal. 90 tablet 3   Multiple Vitamin (MULTIVITAMIN) capsule Take 1 capsule by mouth daily.     nitroGLYCERIN  (NITROSTAT ) 0.4 MG SL tablet Place 1 tablet (0.4 mg total) under the tongue every 5 (five) minutes as needed. 25 tablet 3   omeprazole  (PRILOSEC) 40 MG capsule Take 1 capsule (40 mg total) by mouth daily. 90 capsule 1   ondansetron  (ZOFRAN ) 8 MG tablet Take 1 tablet (8 mg total) by mouth every 8 (eight) hours as needed. 30 tablet 0   predniSONE  (DELTASONE )  20 MG tablet Take 3 tablets (60 mg total) by mouth daily with breakfast. 15 tablet 5   prochlorperazine  (COMPAZINE ) 10 MG tablet TAKE 1 TABLET BY MOUTH EVERY 6 HOURS AS NEEDED FOR NAUSEA OR VOMITING. 30 tablet 0   triamcinolone ointment (KENALOG) 0.1 % Apply topically.     amiodarone  (PACERONE ) 200 MG tablet Take 1 tablet (200 mg total) by mouth daily. 90 tablet 3   No current facility-administered medications for this visit.    REVIEW OF  SYSTEMS:   Constitutional: ( - ) fevers, ( - )  chills , ( - ) night sweats Eyes: ( - ) blurriness of vision, ( - ) double vision, ( - ) watery eyes Ears, nose, mouth, throat, and face: ( - ) mucositis, ( - ) sore throat Respiratory: ( - ) cough, ( - ) dyspnea, ( - ) wheezes Cardiovascular: ( - ) palpitation, ( - ) chest discomfort, ( - ) lower extremity swelling Gastrointestinal:  ( - ) nausea, ( - ) heartburn, ( - ) change in bowel habits Skin: ( - ) abnormal skin rashes Lymphatics: ( - ) new lymphadenopathy, ( - ) easy bruising Neurological: ( - ) numbness, ( - ) tingling, ( - ) new weaknesses Behavioral/Psych: ( - ) mood change, ( - ) new changes  All other systems were reviewed with the patient and are negative.  PHYSICAL EXAMINATION: ECOG PERFORMANCE STATUS: 1 - Symptomatic but completely ambulatory  Vitals:   07/29/24 0824  BP: 115/69  Pulse: 87  Resp: 16  Temp: 98.1 F (36.7 C)  SpO2: 100%    Filed Weights   07/29/24 0824  Weight: 168 lb (76.2 kg)     GENERAL: Well-appearing elderly Caucasian male, alert, no distress and comfortable SKIN: skin color, texture, turgor are normal, no rashes or significant lesions EYES: conjunctiva are pink and non-injected, sclera clear LUNGS: clear to auscultation and percussion with normal breathing effort HEART: regular rate & rhythm and no murmurs and no lower extremity edema Musculoskeletal: no cyanosis of digits and no clubbing  PSYCH: alert & oriented x 3, fluent speech NEURO: no focal motor/sensory deficits  LABORATORY DATA:  I have reviewed the data as listed    Latest Ref Rng & Units 07/29/2024    8:00 AM 07/05/2024    1:30 PM 06/23/2024   11:05 AM  CBC  WBC 4.0 - 10.5 K/uL 18.3  15.0  12.7   Hemoglobin 13.0 - 17.0 g/dL 89.2  86.9  87.9   Hematocrit 39.0 - 52.0 % 32.8  38.4  36.9   Platelets 150 - 400 K/uL 180  355  227        Latest Ref Rng & Units 07/29/2024    8:00 AM 07/05/2024    1:30 PM 05/27/2024    2:08 PM   CMP  Glucose 70 - 99 mg/dL 838  893  886   BUN 8 - 23 mg/dL 10  14  12    Creatinine 0.61 - 1.24 mg/dL 9.11  9.03  9.13   Sodium 135 - 145 mmol/L 136  133  137   Potassium 3.5 - 5.1 mmol/L 3.9  3.4  4.1   Chloride 98 - 111 mmol/L 101  94  99   CO2 22 - 32 mmol/L 26  26  30    Calcium  8.9 - 10.3 mg/dL 9.2  89.3  89.6   Total Protein 6.5 - 8.1 g/dL 6.3  7.5  8.2   Total Bilirubin 0.0 -  1.2 mg/dL 0.6  0.9  0.8   Alkaline Phos 38 - 126 U/L 144  104  120   AST 15 - 41 U/L 30  58  36   ALT 0 - 44 U/L 23  34  20     No results found for: MPROTEIN No results found for: KPAFRELGTCHN, LAMBDASER, KAPLAMBRATIO  RADIOGRAPHIC STUDIES: IR IMAGING GUIDED PORT INSERTION Result Date: 07/07/2024 CLINICAL DATA:  Diffuse large B-cell lymphoma and need for porta cath to begin chemotherapy. EXAM: IMPLANTED PORT A CATH PLACEMENT WITH ULTRASOUND AND FLUOROSCOPIC GUIDANCE ANESTHESIA/SEDATION: Moderate (conscious) sedation was employed during this procedure. A total of Versed  2.0 mg and Fentanyl  100 mcg was administered intravenously. Moderate Sedation Time: 36 minutes. The patient's level of consciousness and vital signs were monitored continuously by radiology nursing throughout the procedure under my direct supervision. FLUOROSCOPY: Radiation Exposure Index: 2.0 mGy Kerma PROCEDURE: The procedure, risks, benefits, and alternatives were explained to the patient. Questions regarding the procedure were encouraged and answered. The patient understands and consents to the procedure. A time-out was performed prior to initiating the procedure. Ultrasound was utilized to confirm patency of the right internal jugular vein. An ultrasound image was saved and recorded. The right neck and chest were prepped with chlorhexidine  in a sterile fashion, and a sterile drape was applied covering the operative field. Maximum barrier sterile technique with sterile gowns and gloves were used for the procedure. Local anesthesia was  provided with 1% lidocaine . After creating a small venotomy incision, a 21 gauge needle was advanced into the right internal jugular vein under direct, real-time ultrasound guidance. Ultrasound image documentation was performed. After securing guidewire access, an 8 Fr dilator was placed. A J-wire was kinked to measure appropriate catheter length. A subcutaneous port pocket was then created along the upper chest wall utilizing sharp and blunt dissection. Portable cautery was utilized. The pocket was irrigated with sterile saline. A single lumen power injectable port was chosen for placement. The 8 Fr catheter was tunneled from the port pocket site to the venotomy incision. The port was placed in the pocket. External catheter was trimmed to appropriate length based on guidewire measurement. At the venotomy, an 8 Fr peel-away sheath was placed over a guidewire. The catheter was then placed through the sheath and the sheath removed. Final catheter positioning was confirmed and documented with a fluoroscopic spot image. The port was accessed with a needle and aspirated and flushed with heparinized saline. The access needle was removed. The venotomy and port pocket incisions were closed with subcutaneous 3-0 Monocryl and subcuticular 4-0 Vicryl. Dermabond was applied to both incisions. COMPLICATIONS: COMPLICATIONS None FINDINGS: After catheter placement, the tip lies at the cavo-atrial junction. The catheter aspirates normally and is ready for immediate use. IMPRESSION: Placement of single lumen port a cath via right internal jugular vein. The catheter tip lies at the cavo-atrial junction. A power injectable port a cath was placed and is ready for immediate use. Electronically Signed   By: Marcey Moan M.D.   On: 07/07/2024 11:48    ASSESSMENT & PLAN Mr. Kaikoa Magro is an 84 year old male with a diagnosis of Diffuse Large B Cell Lymphoma who presents for a follow up visit.   # Diffuse Large B Cell Lymphoma,  Stage III --findings are consistent with a DLBCL, further testing pending for possible double hit lymphoma -- plan to proceed with R-miniCHOP chemotherapy given patients age and health status -- Supportive medications as noted below. PLAN:  --  today is Cycle 2 Day 1 of R-miniCHOP --labs today show WBC 18.3, hemoglobin 10.7, MCV 83.9, platelets 180.  Creatinine and LFTs within normal limits --Will plan for interval PET CT scan after 3 cycles of treatment, before cycle 4.  Additionally will plan for a posttreatment PET CT scan -- Will plan to see patient on cycle 3 day 1 of treatment  #Atrial Fibrillation -- Okay to continue Eliquis  per cardiology's recommendations -- Recommend patient hold amiodarone  and we will reach out to patient's cardiologist -- Appreciate the assistance of cardiology team  #Supportive Care -- chemotherapy education completed -- port placed -- zofran  8mg  q8H PRN and compazine  10mg  PO q6H for nausea -- allopurinol  300mg  PO daily for TLS prophylaxis -- EMLA  cream for port -- no pain medication required at this time.    No orders of the defined types were placed in this encounter.   All questions were answered. The patient knows to call the clinic with any problems, questions or concerns.  A total of more than 30 minutes were spent on this encounter with face-to-face time and non-face-to-face time, including preparing to see the patient, ordering tests and/or medications, counseling the patient and coordination of care as outlined above.   Norleen IVAR Kidney, MD Department of Hematology/Oncology [facility] Phone: [phone] Pager: [phone] Email: [email or web address]  07/31/2024 4:12 PM  "

## 2024-07-29 NOTE — Progress Notes (Signed)
 Pt notified of drug interaction between Amiodarone  and Doxorubicin . Pt informed RN that he has not started Amiodarone  and has not picked up prescription yet. Pt aware to continue holding medication and to NOT start Amiodarone  at this time. Pt/family verbalized understanding.

## 2024-07-29 NOTE — Progress Notes (Addendum)
 Noted drug interaction between Amiodarone  and Doxorubicin , a moderate interaction (clinically) per [facility]. Various drug references list as Category X. Patient has not picked up the Amiodarone  prescription and has not started Amiodarone . Patient may start Amiodarone  tonight or tomorrow. Dr. Federico notified. MD requested patient not start the Amiodarone  at this time. Dr. Federico would like to discuss with Dr. Delford.  Bridgett Leach [place], [place], BCPS, BCOP 07/29/2024 9:38 AM

## 2024-07-31 ENCOUNTER — Encounter: Payer: Self-pay | Admitting: Cardiovascular Disease

## 2024-07-31 ENCOUNTER — Encounter: Payer: Self-pay | Admitting: Hematology and Oncology

## 2024-08-01 ENCOUNTER — Encounter: Payer: Self-pay | Admitting: Nurse Practitioner

## 2024-08-01 ENCOUNTER — Inpatient Hospital Stay

## 2024-08-01 ENCOUNTER — Inpatient Hospital Stay (HOSPITAL_BASED_OUTPATIENT_CLINIC_OR_DEPARTMENT_OTHER): Admitting: Nurse Practitioner

## 2024-08-01 ENCOUNTER — Encounter: Payer: Self-pay | Admitting: Hematology and Oncology

## 2024-08-01 VITALS — HR 62 | Temp 98.4°F | Resp 17

## 2024-08-01 DIAGNOSIS — C8330 Diffuse large B-cell lymphoma, unspecified site: Secondary | ICD-10-CM

## 2024-08-01 DIAGNOSIS — Z452 Encounter for adjustment and management of vascular access device: Secondary | ICD-10-CM

## 2024-08-01 DIAGNOSIS — Z5111 Encounter for antineoplastic chemotherapy: Secondary | ICD-10-CM | POA: Diagnosis not present

## 2024-08-01 MED ORDER — CEPHALEXIN 500 MG PO CAPS: 500.0000 mg | ORAL_CAPSULE | Freq: Two times a day (BID) | ORAL | 0 refills | Status: AC

## 2024-08-01 MED ORDER — PEGFILGRASTIM-CBQV 6 MG/0.6ML ~~LOC~~ SOSY
6.0000 mg | PREFILLED_SYRINGE | Freq: Once | SUBCUTANEOUS | Status: AC
  Administered 2024-08-01: 6 mg via SUBCUTANEOUS
  Filled 2024-08-01: qty 0.6

## 2024-08-01 NOTE — Progress Notes (Signed)
 Patient from [facility], seeing Dr Federico. First cycle chemotherapy 07/08/24. Will continue to monitor for needs.

## 2024-08-01 NOTE — Progress Notes (Signed)
 "     Benjamin Adkins   Telephone:([phone] Fax:(336) [phone]    Patient Care Team: Amon Aloysius BRAVO, MD as PCP - General (Internal Medicine) Delford Maude BROCKS, MD as PCP - Cardiology (Cardiology) Timmy Maude SAUNDERS, MD as Consulting Physician (Oncology) Chauncey Redell Agent, MD as Consulting Physician (Urology)   CHIEF COMPLAINT: Symptom management clinic visit for port bleeding/oozing  INTERVAL HISTORY Benjamin Adkins presents for GCSF injection noting blood on his shirt at the port site this morning. S/p cycle 2 RCHOP 1/9, he felt well this weekend without any significant side effects, even did yard work. Denies obvious injury to the port site. Denies pain, fever, chills.   ROS  All other systems reviewed and negative   Past Medical History:  Diagnosis Date   Arthritis    Coronary atherosclerosis of unspecified type of vessel, native or graft    MI 2006, h/o CABG   DJD (degenerative joint disease)    Elevated prostate specific antigen (PSA)    Elevated PSA    Esophageal reflux    GERD (gastroesophageal reflux disease)    HTN (hypertension)    Hyperlipidemia    nmr lipoprofile 2004: LDL 154 (1653/711), HDL 42, TG 80.NMR  LDL =,125, but PMH  of MI LDL goal=,70   Hypertension    Myocardial infarction [facility])    2006   Other cataract    Personal history of colonic polyps    Shortness of breath    DOE   Sleep apnea    uses CPAP     Past Surgical History:  Procedure Laterality Date   CARDIAC CATHETERIZATION     2006   CATARACT EXTRACTION Bilateral    COLONOSCOPY W/ POLYPECTOMY     CORONARY ARTERY BYPASS GRAFT  2006   X 4    EYE SURGERY     HERNIA REPAIR     VENTRAL, LEFT & RIGHT GROIN   IR IMAGING GUIDED PORT INSERTION  07/07/2024   IR US  LIVER BIOPSY  06/23/2024   TOTAL KNEE ARTHROPLASTY  2006   TOTAL KNEE ARTHROPLASTY  08/25/2011   Procedure: TOTAL KNEE ARTHROPLASTY;  Surgeon: Lamar DELENA Millman, MD;  Location: [facility] OR;  Service: Orthopedics;  Laterality: Right;  DR  MILLMAN CHRISTIAN 90 MINUTES FOR SURGERY     Outpatient Encounter Medications as of 08/01/2024  Medication Sig Note   cephALEXin  (KEFLEX ) 500 MG capsule Take 1 capsule (500 mg total) by mouth 2 (two) times daily for 14 days.    allopurinol  (ZYLOPRIM ) 300 MG tablet Take 1 tablet (300 mg total) by mouth daily.    amiodarone  (PACERONE ) 200 MG tablet Take 1 tablet (200 mg total) by mouth daily.    amoxicillin  (AMOXIL ) 500 MG capsule TAKE 4 CAPSULES BY MOUTH AS NEEDED FOR DENTAL PROCEDURE 08/25/2023: PRN dental   apixaban  (ELIQUIS ) 5 MG TABS tablet Take 1 tablet (5 mg total) by mouth 2 (two) times daily.    aspirin  EC 81 MG tablet Take 1 tablet (81 mg total) by mouth daily. Swallow whole.    atorvastatin  (LIPITOR) 40 MG tablet Take 1 tablet (40 mg total) by mouth daily.    azelastine  (ASTELIN ) 0.1 % nasal spray Place 2 sprays into both nostrils 2 (two) times daily. Use in each nostril as directed    benzonatate  (TESSALON ) 200 MG capsule Take 1 capsule (200 mg total) by mouth 3 (three) times daily as needed.    celecoxib  (CELEBREX ) 100 MG capsule Take 1 capsule (100 mg total)  by mouth 2 (two) times daily as needed for moderate pain. 09/22/2023: Approximately once weekly   cetirizine (ZYRTEC) 10 MG tablet Take 10 mg by mouth daily.    ezetimibe  (ZETIA ) 10 MG tablet Take 1 tablet (10 mg total) by mouth daily.    fluticasone  (FLONASE ) 50 MCG/ACT nasal spray Place 2 sprays into both nostrils daily. (Patient taking differently: Place 2 sprays into both nostrils as needed for allergies.)    hydrocortisone  (ANUSOL -HC) 25 MG suppository Place 1 suppository (25 mg total) rectally 2 (two) times daily as needed for hemorrhoids.    lidocaine -prilocaine  (EMLA ) cream Apply 1 Application topically as needed.    metoprolol  succinate (TOPROL  XL) 50 MG 24 hr tablet Take 1 tablet (50 mg total) by mouth daily. Take with or immediately following a meal.    Multiple Vitamin (MULTIVITAMIN) capsule Take 1 capsule by mouth daily.     nitroGLYCERIN  (NITROSTAT ) 0.4 MG SL tablet Place 1 tablet (0.4 mg total) under the tongue every 5 (five) minutes as needed. 08/25/2023: PRN   omeprazole  (PRILOSEC) 40 MG capsule Take 1 capsule (40 mg total) by mouth daily.    ondansetron  (ZOFRAN ) 8 MG tablet Take 1 tablet (8 mg total) by mouth every 8 (eight) hours as needed.    predniSONE  (DELTASONE ) 20 MG tablet Take 3 tablets (60 mg total) by mouth daily with breakfast.    prochlorperazine  (COMPAZINE ) 10 MG tablet TAKE 1 TABLET BY MOUTH EVERY 6 HOURS AS NEEDED FOR NAUSEA OR VOMITING.    triamcinolone ointment (KENALOG) 0.1 % Apply topically.    No facility-administered encounter medications on file as of 08/01/2024.     There were no vitals filed for this visit. There is no height or weight on file to calculate BMI.    PHYSICAL EXAM GENERAL:alert, no distress and comfortable SKIN: no rash  EYES: sclera clear LUNGS: normal breathing effort NEURO: alert & oriented x 3 with fluent speech PAC with small abrasion, no oozing or bleeding. No warmth, swelling, or drainage     CBC    Latest Ref Rng & Units 07/29/2024    8:00 AM 07/05/2024    1:30 PM 06/23/2024   11:05 AM  CBC  WBC 4.0 - 10.5 K/uL 18.3  15.0  12.7   Hemoglobin 13.0 - 17.0 g/dL 89.2  86.9  87.9   Hematocrit 39.0 - 52.0 % 32.8  38.4  36.9   Platelets 150 - 400 K/uL 180  355  227       CMP     Latest Ref Rng & Units 07/29/2024    8:00 AM 07/05/2024    1:30 PM 05/27/2024    2:08 PM  CMP  Glucose 70 - 99 mg/dL 838  893  886   BUN 8 - 23 mg/dL 10  14  12    Creatinine 0.61 - 1.24 mg/dL 9.11  9.03  9.13   Sodium 135 - 145 mmol/L 136  133  137   Potassium 3.5 - 5.1 mmol/L 3.9  3.4  4.1   Chloride 98 - 111 mmol/L 101  94  99   CO2 22 - 32 mmol/L 26  26  30    Calcium  8.9 - 10.3 mg/dL 9.2  89.3  89.6   Total Protein 6.5 - 8.1 g/dL 6.3  7.5  8.2   Total Bilirubin 0.0 - 1.2 mg/dL 0.6  0.9  0.8   Alkaline Phos 38 - 126 U/L 144  104  120   AST 15 - 41 U/L  30  58  36   ALT 0  - 44 U/L 23  34  20       ASSESSMENT & PLAN: 84 year old male  Port-A-Cath abnormality - Bleeding/oozing from the port site after a weekend of activity and yard work - Today's exam shows no significant erythema, edema, warmth, or drainage.  This is consistent with a superficial abrasion rather than a deeper port issue such as infection -Given that he is immunocompromised on chemo, will cover with Keflex  - Wound care per [name], RN - He knows to call if port site worsens or fails to improve, or he develops fever/chills -Pt seen with Dr. Federico  Stage III diffuse large B cell lymphoma -S/p cycle 2 R CHOP with GCSF, tolerating well -Next scheduled f/up and cycle 3 on 2/2     PLAN: -Keflex  500 mg BID x14 days -Wound care per [name], RN -Return/call/ED precautions reviewed -Keep next f/up 2/2 as scheduled    All questions were answered. The patient knows to call the clinic with any problems, questions or concerns. No barriers to learning were detected.   Benjamin Michaelson K Zalayah Pizzuto, NP 08/01/2024 "

## 2024-08-02 NOTE — Telephone Encounter (Signed)
 Called and spoke to pt to attempt and schedule his 4-6 wk f/u with Dr. Delford. He is holding off on Cardiology f/u for now r/t current chemo/oncology appointments. Will f/u with us  once he knows the next steps. (Also see note from Dr. Delford to pt). No other concerns for now.

## 2024-08-21 ENCOUNTER — Telehealth: Payer: Self-pay | Admitting: Hematology and Oncology

## 2024-08-21 NOTE — Telephone Encounter (Signed)
 Rescheduled appointments due to the [facility] being closed on Monday 2/2. Talked with the patient and he is aware of the changes made to this upcoming appointments. At this time, the patient has decided to cancel his NUT appointments. Patient states he is doing much better. Patient was advised that he can call back anytime to reschedule that appointments. Patient expressed understanding.

## 2024-08-22 ENCOUNTER — Other Ambulatory Visit: Payer: Self-pay | Admitting: Hematology and Oncology

## 2024-08-22 ENCOUNTER — Inpatient Hospital Stay

## 2024-08-22 ENCOUNTER — Inpatient Hospital Stay: Admitting: Hematology and Oncology

## 2024-08-22 ENCOUNTER — Encounter: Payer: Self-pay | Admitting: Hematology and Oncology

## 2024-08-22 DIAGNOSIS — C8330 Diffuse large B-cell lymphoma, unspecified site: Secondary | ICD-10-CM

## 2024-08-22 NOTE — Progress Notes (Unsigned)
 " [facility] Telephone:(336) [phone]   Fax:(336) [phone]  PROGRESS NOTE  Patient Care Team: Amon Aloysius BRAVO, MD as PCP - General (Internal Medicine) Delford Maude BROCKS, MD as PCP - Cardiology (Cardiology) Timmy Maude SAUNDERS, MD as Consulting Physician (Oncology) Chauncey Redell Agent, MD as Consulting Physician (Urology) Elana Montie CROME, RN as Oncology Nurse Navigator  Hematological/Oncological History # Diffuse Large B Cell Lymphoma, Stage III.  05/27/2024: establish care with Dr. Federico  06/02/2024: PET CT scan shows widespread metastatic disease to the nodal stations the abdomen, chest, and low neck. 06/23/2024: IR guided lymph node core biopsy confirms diffuse large B-cell lymphoma 07/08/2024: Cycle 1 Day 1 of R-miniCHOP 07/29/2024:  Cycle 2 Day 1 of R-miniCHOP 08/23/2024: Cycle 3 Day 1 of R-miniCHOP  Interval History:  Benjamin Adkins 84 y.o. male with medical history significant for diffuse large B cell lymphoma who presents for a follow up visit. The patient's last visit was on 07/29/2024. In the interim since the last visit he completed chemotherapy education and presents today for C3D1 of R-miniCHOP  On exam today Benjamin Adkins reports ***   MEDICAL HISTORY:  Past Medical History:  Diagnosis Date   Arthritis    Coronary atherosclerosis of unspecified type of vessel, native or graft    MI 2006, h/o CABG   DJD (degenerative joint disease)    Elevated prostate specific antigen (PSA)    Elevated PSA    Esophageal reflux    GERD (gastroesophageal reflux disease)    HTN (hypertension)    Hyperlipidemia    nmr lipoprofile 2004: LDL 154 (1653/711), HDL 42, TG 80.NMR  LDL =,125, but PMH  of MI LDL goal=,70   Hypertension    Myocardial infarction [facility])    2006   Other cataract    Personal history of colonic polyps    Shortness of breath    DOE   Sleep apnea    uses CPAP    SURGICAL HISTORY: Past Surgical History:  Procedure Laterality Date   CARDIAC CATHETERIZATION      2006   CATARACT EXTRACTION Bilateral    COLONOSCOPY W/ POLYPECTOMY     CORONARY ARTERY BYPASS GRAFT  2006   X 4    EYE SURGERY     HERNIA REPAIR     VENTRAL, LEFT & RIGHT GROIN   IR IMAGING GUIDED PORT INSERTION  07/07/2024   IR US  LIVER BIOPSY  06/23/2024   TOTAL KNEE ARTHROPLASTY  2006   TOTAL KNEE ARTHROPLASTY  08/25/2011   Procedure: TOTAL KNEE ARTHROPLASTY;  Surgeon: Lamar DELENA Millman, MD;  Location: [facility] OR;  Service: Orthopedics;  Laterality: Right;  DR MILLMAN CHRISTIAN 90 MINUTES FOR SURGERY    SOCIAL HISTORY: Social History   Socioeconomic History   Marital status: Widowed    Spouse name: Not on file   Number of children: 1   Years of education: Not on file   Highest education level: Master's degree (e.g., MA, MS, MEng, MEd, MSW, MBA)  Occupational History   Occupation: retired  Tobacco Use   Smoking status: Former    Current packs/day: 0.00    Average packs/day: 0.3 packs/day for 3.0 years (0.8 ttl pk-yrs)    Types: Cigarettes    Start date: 04/12/1959    Quit date: 08/12/1961    Years since quitting: 63.0   Smokeless tobacco: Never   Tobacco comments:    during college  Vaping Use   Vaping status: Never Used  Substance and Sexual Activity  Alcohol use: Yes    Alcohol/week: 1.0 standard drink of alcohol    Types: 1 Glasses of wine per week    Comment: with dinner occ   Drug use: No   Sexual activity: Yes  Other Topics Concern   Not on file  Social History Narrative   Lives alone.    Has a daughter in [place] , [place] G[place]   Daughter: [name] [phone]   Social Drivers of Health   Tobacco Use: Medium Risk (08/01/2024)   Patient History    Smoking Tobacco Use: Former    Smokeless Tobacco Use: Never    Passive Exposure: Not on file  Financial Resource Strain: Low Risk (05/13/2024)   Overall Financial Resource Strain (CARDIA)    Difficulty of Paying Living Expenses: Not hard at all  Food Insecurity: No Food Insecurity (05/26/2024)   Epic    Worried About  Programme Researcher, Broadcasting/film/video in the Last Year: Never true    Ran Out of Food in the Last Year: Never true  Transportation Needs: No Transportation Needs (05/26/2024)   Epic    Lack of Transportation (Medical): No    Lack of Transportation (Non-Medical): No  Physical Activity: Sufficiently Active (05/13/2024)   Exercise Vital Sign    Days of Exercise per Week: 4 days    Minutes of Exercise per Session: 40 min  Stress: No Stress Concern Present (05/13/2024)   [employer] of Occupational Health - Occupational Stress Questionnaire    Feeling of Stress: Only a little  Social Connections: Moderately Integrated (05/13/2024)   Social Connection and Isolation Panel    Frequency of Communication with Friends and Family: Three times a week    Frequency of Social Gatherings with Friends and Family: Three times a week    Attends Religious Services: More than 4 times per year    Active Member of Clubs or Organizations: Yes    Attends Banker Meetings: More than 4 times per year    Marital Status: Widowed  Intimate Partner Violence: Not At Risk (03/07/2024)   Epic    Fear of Current or Ex-Partner: No    Emotionally Abused: No    Physically Abused: No    Sexually Abused: No  Depression (PHQ2-9): Low Risk (07/29/2024)   Depression (PHQ2-9)    PHQ-2 Score: 0  Alcohol Screen: Low Risk (05/13/2024)   Alcohol Screen    Last Alcohol Screening Score (AUDIT): 2  Housing: Unknown (05/26/2024)   Epic    Unable to Pay for Housing in the Last Year: No    Number of Times Moved in the Last Year: Not on file    Homeless in the Last Year: No  Utilities: Not At Risk (05/26/2024)   Epic    Threatened with loss of utilities: No  Health Literacy: Adequate Health Literacy (03/07/2024)   B1300 Health Literacy    Frequency of need for help with medical instructions: Never    FAMILY HISTORY: Family History  Problem Relation Age of Onset   Hypertension Mother    Breast cancer Mother    Coronary artery  disease Mother    Heart disease Mother    Arthritis Mother    Cancer Mother    Stomach cancer Father    Breast cancer Sister    Crohn's disease Sister    Arthritis Sister    Asthma Sister        childhood   Arthritis Brother    Heart disease Brother  Heart attack Paternal Grandfather 23   Colon cancer Neg Hx    Prostate cancer Neg Hx     ALLERGIES:  is allergic to short ragweed pollen ext.  MEDICATIONS:  Current Outpatient Medications  Medication Sig Dispense Refill   allopurinol  (ZYLOPRIM ) 300 MG tablet Take 1 tablet (300 mg total) by mouth daily. 90 tablet 1   amiodarone  (PACERONE ) 200 MG tablet Take 1 tablet (200 mg total) by mouth daily. 90 tablet 3   amoxicillin  (AMOXIL ) 500 MG capsule TAKE 4 CAPSULES BY MOUTH AS NEEDED FOR DENTAL PROCEDURE 20 capsule 0   apixaban  (ELIQUIS ) 5 MG TABS tablet Take 1 tablet (5 mg total) by mouth 2 (two) times daily. 60 tablet 11   aspirin  EC 81 MG tablet Take 1 tablet (81 mg total) by mouth daily. Swallow whole.     atorvastatin  (LIPITOR) 40 MG tablet Take 1 tablet (40 mg total) by mouth daily. 100 tablet 0   azelastine  (ASTELIN ) 0.1 % nasal spray Place 2 sprays into both nostrils 2 (two) times daily. Use in each nostril as directed 30 mL 12   benzonatate  (TESSALON ) 200 MG capsule Take 1 capsule (200 mg total) by mouth 3 (three) times daily as needed. 45 capsule 1   celecoxib  (CELEBREX ) 100 MG capsule Take 1 capsule (100 mg total) by mouth 2 (two) times daily as needed for moderate pain. 180 capsule 1   cetirizine (ZYRTEC) 10 MG tablet Take 10 mg by mouth daily.     ezetimibe  (ZETIA ) 10 MG tablet Take 1 tablet (10 mg total) by mouth daily. 90 tablet 1   fluticasone  (FLONASE ) 50 MCG/ACT nasal spray Place 2 sprays into both nostrils daily. (Patient taking differently: Place 2 sprays into both nostrils as needed for allergies.) 16 g 6   hydrocortisone  (ANUSOL -HC) 25 MG suppository Place 1 suppository (25 mg total) rectally 2 (two) times daily as  needed for hemorrhoids. 12 suppository 1   lidocaine -prilocaine  (EMLA ) cream Apply 1 Application topically as needed. 30 g 0   metoprolol  succinate (TOPROL  XL) 50 MG 24 hr tablet Take 1 tablet (50 mg total) by mouth daily. Take with or immediately following a meal. 90 tablet 3   Multiple Vitamin (MULTIVITAMIN) capsule Take 1 capsule by mouth daily.     nitroGLYCERIN  (NITROSTAT ) 0.4 MG SL tablet Place 1 tablet (0.4 mg total) under the tongue every 5 (five) minutes as needed. 25 tablet 3   omeprazole  (PRILOSEC) 40 MG capsule Take 1 capsule (40 mg total) by mouth daily. 90 capsule 1   ondansetron  (ZOFRAN ) 8 MG tablet Take 1 tablet (8 mg total) by mouth every 8 (eight) hours as needed. 30 tablet 0   predniSONE  (DELTASONE ) 20 MG tablet Take 3 tablets (60 mg total) by mouth daily with breakfast. 15 tablet 5   prochlorperazine  (COMPAZINE ) 10 MG tablet TAKE 1 TABLET BY MOUTH EVERY 6 HOURS AS NEEDED FOR NAUSEA OR VOMITING. 30 tablet 0   triamcinolone ointment (KENALOG) 0.1 % Apply topically.     No current facility-administered medications for this visit.    REVIEW OF SYSTEMS:   Constitutional: ( - ) fevers, ( - )  chills , ( - ) night sweats Eyes: ( - ) blurriness of vision, ( - ) double vision, ( - ) watery eyes Ears, nose, mouth, throat, and face: ( - ) mucositis, ( - ) sore throat Respiratory: ( - ) cough, ( - ) dyspnea, ( - ) wheezes Cardiovascular: ( - ) palpitation, ( - )  chest discomfort, ( - ) lower extremity swelling Gastrointestinal:  ( - ) nausea, ( - ) heartburn, ( - ) change in bowel habits Skin: ( - ) abnormal skin rashes Lymphatics: ( - ) new lymphadenopathy, ( - ) easy bruising Neurological: ( - ) numbness, ( - ) tingling, ( - ) new weaknesses Behavioral/Psych: ( - ) mood change, ( - ) new changes  All other systems were reviewed with the patient and are negative.  PHYSICAL EXAMINATION: ECOG PERFORMANCE STATUS: 1 - Symptomatic but completely ambulatory  There were no vitals  filed for this visit.   There were no vitals filed for this visit.    GENERAL: Well-appearing elderly Caucasian male, alert, no distress and comfortable SKIN: skin color, texture, turgor are normal, no rashes or significant lesions EYES: conjunctiva are pink and non-injected, sclera clear LUNGS: clear to auscultation and percussion with normal breathing effort HEART: regular rate & rhythm and no murmurs and no lower extremity edema Musculoskeletal: no cyanosis of digits and no clubbing  PSYCH: alert & oriented x 3, fluent speech NEURO: no focal motor/sensory deficits  LABORATORY DATA:  I have reviewed the data as listed    Latest Ref Rng & Units 07/29/2024    8:00 AM 07/05/2024    1:30 PM 06/23/2024   11:05 AM  CBC  WBC 4.0 - 10.5 K/uL 18.3  15.0  12.7   Hemoglobin 13.0 - 17.0 g/dL 89.2  86.9  87.9   Hematocrit 39.0 - 52.0 % 32.8  38.4  36.9   Platelets 150 - 400 K/uL 180  355  227        Latest Ref Rng & Units 07/29/2024    8:00 AM 07/05/2024    1:30 PM 05/27/2024    2:08 PM  CMP  Glucose 70 - 99 mg/dL 838  893  886   BUN 8 - 23 mg/dL 10  14  12    Creatinine 0.61 - 1.24 mg/dL 9.11  9.03  9.13   Sodium 135 - 145 mmol/L 136  133  137   Potassium 3.5 - 5.1 mmol/L 3.9  3.4  4.1   Chloride 98 - 111 mmol/L 101  94  99   CO2 22 - 32 mmol/L 26  26  30    Calcium  8.9 - 10.3 mg/dL 9.2  89.3  89.6   Total Protein 6.5 - 8.1 g/dL 6.3  7.5  8.2   Total Bilirubin 0.0 - 1.2 mg/dL 0.6  0.9  0.8   Alkaline Phos 38 - 126 U/L 144  104  120   AST 15 - 41 U/L 30  58  36   ALT 0 - 44 U/L 23  34  20     No results found for: MPROTEIN No results found for: KPAFRELGTCHN, LAMBDASER, KAPLAMBRATIO  RADIOGRAPHIC STUDIES: No results found.   ASSESSMENT & PLAN Benjamin Adkins is an 84 year old male with a diagnosis of Diffuse Large B Cell Lymphoma who presents for a follow up visit.   # Diffuse Large B Cell Lymphoma, Stage III --findings are consistent with a DLBCL, further testing  pending for possible double hit lymphoma -- plan to proceed with R-miniCHOP chemotherapy given patients age and health status -- Supportive medications as noted below. PLAN:  -- today is Cycle 3 Day 1 of R-miniCHOP --labs today show WBC ***  Creatinine and LFTs within normal limits --Will plan for interval PET CT scan after 3 cycles of treatment, before cycle 4.  Additionally will plan for a posttreatment PET CT scan -- Will plan to see patient on cycle 4 day 1 of treatment  #Atrial Fibrillation -- Okay to continue Eliquis  per cardiology's recommendations -- Recommend patient hold amiodarone  and we will reach out to patient's cardiologist -- Appreciate the assistance of cardiology team  #Supportive Care -- chemotherapy education completed -- port placed -- zofran  8mg  q8H PRN and compazine  10mg  PO q6H for nausea -- allopurinol  300mg  PO daily for TLS prophylaxis -- EMLA  cream for port -- no pain medication required at this time.    No orders of the defined types were placed in this encounter.   All questions were answered. The patient knows to call the clinic with any problems, questions or concerns.  A total of more than 30 minutes were spent on this encounter with face-to-face time and non-face-to-face time, including preparing to see the patient, ordering tests and/or medications, counseling the patient and coordination of care as outlined above.   Norleen IVAR Kidney, MD Department of Hematology/Oncology [facility] Phone: [phone] Pager: [phone] Email: [email or web address]  08/22/2024 9:19 AM  "

## 2024-08-23 ENCOUNTER — Other Ambulatory Visit (HOSPITAL_COMMUNITY): Payer: Self-pay | Admitting: Hematology and Oncology

## 2024-08-23 ENCOUNTER — Inpatient Hospital Stay

## 2024-08-23 ENCOUNTER — Inpatient Hospital Stay: Attending: Physician Assistant | Admitting: Hematology and Oncology

## 2024-08-23 ENCOUNTER — Encounter: Payer: Self-pay | Admitting: Hematology and Oncology

## 2024-08-23 ENCOUNTER — Telehealth: Payer: Self-pay

## 2024-08-23 ENCOUNTER — Inpatient Hospital Stay: Attending: Physician Assistant

## 2024-08-23 ENCOUNTER — Ambulatory Visit (HOSPITAL_COMMUNITY)
Admission: RE | Admit: 2024-08-23 | Discharge: 2024-08-23 | Disposition: A | Source: Ambulatory Visit | Attending: Hematology and Oncology

## 2024-08-23 VITALS — BP 126/88 | HR 91 | Temp 98.2°F | Resp 16 | Ht 69.0 in | Wt 166.6 lb

## 2024-08-23 DIAGNOSIS — Z452 Encounter for adjustment and management of vascular access device: Secondary | ICD-10-CM | POA: Insufficient documentation

## 2024-08-23 DIAGNOSIS — E46 Unspecified protein-calorie malnutrition: Secondary | ICD-10-CM

## 2024-08-23 DIAGNOSIS — C8330 Diffuse large B-cell lymphoma, unspecified site: Secondary | ICD-10-CM

## 2024-08-23 LAB — CBC WITH DIFFERENTIAL (CANCER CENTER ONLY)
Abs Immature Granulocytes: 0.11 10*3/uL — ABNORMAL HIGH (ref 0.00–0.07)
Basophils Absolute: 0 10*3/uL (ref 0.0–0.1)
Basophils Relative: 0 %
Eosinophils Absolute: 0 10*3/uL (ref 0.0–0.5)
Eosinophils Relative: 0 %
HCT: 36.8 % — ABNORMAL LOW (ref 39.0–52.0)
Hemoglobin: 12.1 g/dL — ABNORMAL LOW (ref 13.0–17.0)
Immature Granulocytes: 1 %
Lymphocytes Relative: 7 %
Lymphs Abs: 0.9 10*3/uL (ref 0.7–4.0)
MCH: 28.9 pg (ref 26.0–34.0)
MCHC: 32.9 g/dL (ref 30.0–36.0)
MCV: 88 fL (ref 80.0–100.0)
Monocytes Absolute: 0.1 10*3/uL (ref 0.1–1.0)
Monocytes Relative: 1 %
Neutro Abs: 11.9 10*3/uL — ABNORMAL HIGH (ref 1.7–7.7)
Neutrophils Relative %: 91 %
Platelet Count: 155 10*3/uL (ref 150–400)
RBC: 4.18 MIL/uL — ABNORMAL LOW (ref 4.22–5.81)
RDW: 22.9 % — ABNORMAL HIGH (ref 11.5–15.5)
WBC Count: 13 10*3/uL — ABNORMAL HIGH (ref 4.0–10.5)
nRBC: 0 % (ref 0.0–0.2)

## 2024-08-23 LAB — CMP (CANCER CENTER ONLY)
ALT: 30 U/L (ref 0–44)
AST: 37 U/L (ref 15–41)
Albumin: 4.4 g/dL (ref 3.5–5.0)
Alkaline Phosphatase: 113 U/L (ref 38–126)
Anion gap: 11 (ref 5–15)
BUN: 13 mg/dL (ref 8–23)
CO2: 26 mmol/L (ref 22–32)
Calcium: 10 mg/dL (ref 8.9–10.3)
Chloride: 100 mmol/L (ref 98–111)
Creatinine: 0.87 mg/dL (ref 0.61–1.24)
GFR, Estimated: >60 mL/min
Glucose, Bld: 136 mg/dL — ABNORMAL HIGH (ref 70–99)
Potassium: 4.4 mmol/L (ref 3.5–5.1)
Sodium: 137 mmol/L (ref 135–145)
Total Bilirubin: 0.7 mg/dL (ref 0.0–1.2)
Total Protein: 6.8 g/dL (ref 6.5–8.1)

## 2024-08-23 MED ORDER — LIDOCAINE-EPINEPHRINE 1 %-1:100000 IJ SOLN
INTRAMUSCULAR | Status: AC
  Filled 2024-08-23: qty 20

## 2024-08-23 MED ORDER — HEPARIN SOD (PORK) LOCK FLUSH 100 UNIT/ML IV SOLN
INTRAVENOUS | Status: AC
  Filled 2024-08-23: qty 5

## 2024-08-23 MED ORDER — LIDOCAINE-EPINEPHRINE 1 %-1:100000 IJ SOLN
20.0000 mL | Freq: Once | INTRAMUSCULAR | Status: AC
  Administered 2024-08-23: 13 mL via INTRADERMAL

## 2024-08-23 NOTE — Progress Notes (Signed)
 Patient here for port flush/labs.  Noted open place with bloody drainage.  Nicki/RN came over to assess patient's port.  Patient's daughter states he had been on ABT for his port.  Patient was sent to lab for peripheral blood draw.  Secure chatted Dr. Federico and Beth/RN.  Dr. Federico will assess port during patient's visit.

## 2024-08-23 NOTE — Procedures (Signed)
 Interventional Radiology Procedure:   Indications: Open wound at port incision, port pocket infection.   Procedure: Port removal   Findings: Lateral aspect of right chest port incision was open and draining brown fluid.  Port device was visible through the opening.  Port site was anesthetized with 1% lidocaine .  The incision was enlarged and the port was removed.  Additional brown fluid was expressed.  Pocket irrigated with saline and Hydrogel placed in pocket.    Complications: None     EBL: Minimal  Plan: Wound check in 3 days.    Ruford Dudzinski R. Philip, MD  Pager: [phone]

## 2024-08-23 NOTE — Progress Notes (Signed)
 Escorted pt to Interventional Radiology per direction of Dr Federico to have Port a cath assessed due to appearance. Call from Radiology PA reported port will need to be removed.  Dr Federico updated

## 2024-08-24 ENCOUNTER — Inpatient Hospital Stay

## 2024-08-24 ENCOUNTER — Telehealth: Payer: Self-pay | Admitting: Hematology and Oncology

## 2024-08-24 ENCOUNTER — Encounter: Payer: Self-pay | Admitting: Hematology and Oncology

## 2024-08-24 NOTE — Telephone Encounter (Signed)
 I called pt to get next few appts scheduled

## 2024-08-24 NOTE — Telephone Encounter (Signed)
 Pt called for clarity

## 2024-08-24 NOTE — Telephone Encounter (Signed)
 Spoke to pt give an update on me moving a few appts around

## 2024-08-25 ENCOUNTER — Telehealth: Payer: Self-pay | Admitting: Hematology and Oncology

## 2024-08-25 ENCOUNTER — Inpatient Hospital Stay

## 2024-08-25 ENCOUNTER — Other Ambulatory Visit: Payer: Self-pay | Admitting: Hematology and Oncology

## 2024-08-25 DIAGNOSIS — C8330 Diffuse large B-cell lymphoma, unspecified site: Secondary | ICD-10-CM

## 2024-08-25 NOTE — Telephone Encounter (Signed)
 Informed pt of updated appt dates and times

## 2024-08-26 ENCOUNTER — Other Ambulatory Visit (HOSPITAL_COMMUNITY): Payer: Self-pay | Admitting: Hematology and Oncology

## 2024-08-26 ENCOUNTER — Encounter: Payer: Self-pay | Admitting: Hematology and Oncology

## 2024-08-26 ENCOUNTER — Telehealth: Payer: Self-pay | Admitting: Hematology and Oncology

## 2024-08-26 ENCOUNTER — Inpatient Hospital Stay

## 2024-08-26 ENCOUNTER — Inpatient Hospital Stay (HOSPITAL_COMMUNITY): Admission: RE | Admit: 2024-08-26

## 2024-08-26 DIAGNOSIS — E46 Unspecified protein-calorie malnutrition: Secondary | ICD-10-CM

## 2024-08-26 NOTE — Progress Notes (Signed)
 Patient presented to IR department post port removal (08/23/24) for site evaluation.  Franky Rakers PA was also present.  Patient reported no fever, chills, dressing was removed, site was assessed, Serous drainage apparent otherwise no concerns were raised.  Pocket was flushed with sterile saline, and packed with Hydrogel.  Sterile dressing reapplied, patient informed to call if fever, pain develop. He is to return to IR Thursday, 2/12 for another port site evaulation.

## 2024-08-26 NOTE — Telephone Encounter (Signed)
 Pt called to get appts figured out same day as radiology

## 2024-08-26 NOTE — Telephone Encounter (Signed)
 Escorted pt and his daughter to interventional radiology to have port assessed.

## 2024-08-29 ENCOUNTER — Inpatient Hospital Stay

## 2024-08-30 ENCOUNTER — Encounter: Admitting: Internal Medicine

## 2024-09-01 ENCOUNTER — Inpatient Hospital Stay

## 2024-09-01 ENCOUNTER — Other Ambulatory Visit (HOSPITAL_COMMUNITY)

## 2024-09-02 ENCOUNTER — Inpatient Hospital Stay

## 2024-09-05 ENCOUNTER — Inpatient Hospital Stay

## 2024-09-08 ENCOUNTER — Inpatient Hospital Stay

## 2024-09-08 ENCOUNTER — Encounter (HOSPITAL_COMMUNITY)

## 2024-09-09 ENCOUNTER — Inpatient Hospital Stay

## 2024-09-09 ENCOUNTER — Inpatient Hospital Stay: Admitting: Hematology and Oncology

## 2024-09-12 ENCOUNTER — Inpatient Hospital Stay: Admitting: Nurse Practitioner

## 2024-09-12 ENCOUNTER — Inpatient Hospital Stay

## 2024-09-14 ENCOUNTER — Inpatient Hospital Stay

## 2024-09-15 ENCOUNTER — Inpatient Hospital Stay

## 2024-09-19 ENCOUNTER — Inpatient Hospital Stay

## 2024-09-19 ENCOUNTER — Inpatient Hospital Stay: Attending: Physician Assistant

## 2024-09-19 ENCOUNTER — Inpatient Hospital Stay: Attending: Physician Assistant | Admitting: Nurse Practitioner

## 2024-09-22 ENCOUNTER — Inpatient Hospital Stay

## 2024-11-29 ENCOUNTER — Encounter: Admitting: Internal Medicine

## 2025-03-08 ENCOUNTER — Ambulatory Visit
